# Patient Record
Sex: Female | Born: 1985 | Hispanic: Yes | Smoking: Never smoker
Health system: Southern US, Community
[De-identification: ages and names within clinical notes are randomized; demographics above are authoritative.]

## PROBLEM LIST (undated history)

## (undated) ENCOUNTER — Inpatient Hospital Stay (HOSPITAL_COMMUNITY): Payer: Self-pay

## (undated) DIAGNOSIS — N949 Unspecified condition associated with female genital organs and menstrual cycle: Secondary | ICD-10-CM

## (undated) DIAGNOSIS — N390 Urinary tract infection, site not specified: Secondary | ICD-10-CM

## (undated) DIAGNOSIS — Z789 Other specified health status: Secondary | ICD-10-CM

## (undated) DIAGNOSIS — R51 Headache: Secondary | ICD-10-CM

## (undated) DIAGNOSIS — D649 Anemia, unspecified: Secondary | ICD-10-CM

## (undated) DIAGNOSIS — F329 Major depressive disorder, single episode, unspecified: Secondary | ICD-10-CM

## (undated) DIAGNOSIS — F419 Anxiety disorder, unspecified: Secondary | ICD-10-CM

## (undated) HISTORY — DX: Anemia, unspecified: D64.9

## (undated) HISTORY — DX: Unspecified condition associated with female genital organs and menstrual cycle: N94.9

## (undated) HISTORY — PX: NO PAST SURGERIES: SHX2092

---
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
## 2009-06-22 ENCOUNTER — Inpatient Hospital Stay (HOSPITAL_COMMUNITY): Admission: AD | Admit: 2009-06-22 | Discharge: 2009-06-23 | Payer: Self-pay | Admitting: Obstetrics & Gynecology

## 2009-09-03 ENCOUNTER — Ambulatory Visit: Payer: Self-pay | Admitting: Advanced Practice Midwife

## 2009-09-03 ENCOUNTER — Inpatient Hospital Stay (HOSPITAL_COMMUNITY): Admission: AD | Admit: 2009-09-03 | Discharge: 2009-09-03 | Payer: Self-pay | Admitting: Obstetrics & Gynecology

## 2009-12-25 ENCOUNTER — Inpatient Hospital Stay (HOSPITAL_COMMUNITY): Admission: AD | Admit: 2009-12-25 | Discharge: 2009-12-26 | Payer: Self-pay | Admitting: Obstetrics & Gynecology

## 2009-12-25 ENCOUNTER — Ambulatory Visit: Payer: Self-pay | Admitting: Obstetrics and Gynecology

## 2009-12-29 ENCOUNTER — Inpatient Hospital Stay (HOSPITAL_COMMUNITY): Admission: AD | Admit: 2009-12-29 | Discharge: 2009-12-30 | Payer: Self-pay | Admitting: Obstetrics & Gynecology

## 2010-02-11 ENCOUNTER — Emergency Department (HOSPITAL_COMMUNITY): Admission: EM | Admit: 2010-02-11 | Discharge: 2010-02-11 | Payer: Self-pay | Admitting: Emergency Medicine

## 2011-01-19 ENCOUNTER — Inpatient Hospital Stay (HOSPITAL_COMMUNITY): Payer: Self-pay

## 2011-01-19 ENCOUNTER — Inpatient Hospital Stay (HOSPITAL_COMMUNITY)
Admit: 2011-01-19 | Discharge: 2011-01-19 | Disposition: A | Discharge status: Home / Self Care | Payer: Self-pay | Source: Ambulatory Visit | Attending: Obstetrics & Gynecology | Admitting: Obstetrics & Gynecology

## 2011-01-19 DIAGNOSIS — N1330 Unspecified hydronephrosis: Secondary | ICD-10-CM

## 2011-01-19 DIAGNOSIS — O2390 Unspecified genitourinary tract infection in pregnancy, unspecified trimester: Secondary | ICD-10-CM | POA: Insufficient documentation

## 2011-01-19 DIAGNOSIS — N390 Urinary tract infection, site not specified: Secondary | ICD-10-CM | POA: Insufficient documentation

## 2011-01-19 DIAGNOSIS — R109 Unspecified abdominal pain: Secondary | ICD-10-CM | POA: Insufficient documentation

## 2011-01-19 LAB — URINALYSIS, ROUTINE W REFLEX MICROSCOPIC
Bilirubin Urine: NEGATIVE
Ketones, ur: NEGATIVE mg/dL
Nitrite: NEGATIVE
Protein, ur: NEGATIVE mg/dL
Urobilinogen, UA: 1 mg/dL (ref 0.0–1.0)

## 2011-01-19 LAB — URINE MICROSCOPIC-ADD ON

## 2011-01-19 LAB — WET PREP, GENITAL
Trich, Wet Prep: NONE SEEN
Yeast Wet Prep HPF POC: NONE SEEN

## 2011-01-20 LAB — URINE CULTURE
Colony Count: NO GROWTH
Culture: NO GROWTH

## 2011-01-25 ENCOUNTER — Encounter: Payer: Self-pay | Admitting: Advanced Practice Midwife

## 2011-01-25 ENCOUNTER — Other Ambulatory Visit: Payer: Self-pay | Admitting: Obstetrics and Gynecology

## 2011-01-25 ENCOUNTER — Other Ambulatory Visit: Payer: Self-pay

## 2011-01-25 LAB — POCT URINALYSIS DIPSTICK
Bilirubin Urine: NEGATIVE
Glucose, UA: NEGATIVE mg/dL
Nitrite: NEGATIVE
Urobilinogen, UA: 0.2 mg/dL (ref 0.0–1.0)
pH: 7 (ref 5.0–8.0)

## 2011-01-25 LAB — CONVERTED CEMR LAB
Antibody Screen: NEGATIVE
Eosinophils Absolute: 0.1 10*3/uL (ref 0.0–0.7)
Eosinophils Relative: 1 % (ref 0–5)
HIV: NONREACTIVE
Hemoglobin: 10 g/dL — ABNORMAL LOW (ref 12.0–15.0)
Hepatitis B Surface Ag: NEGATIVE
Lymphocytes Relative: 21 % (ref 12–46)
Monocytes Absolute: 0.5 10*3/uL (ref 0.1–1.0)
Neutro Abs: 5.3 10*3/uL (ref 1.7–7.7)
Neutrophils Relative %: 71 % (ref 43–77)
RBC: 3.64 M/uL — ABNORMAL LOW (ref 3.87–5.11)
RDW: 14.2 % (ref 11.5–15.5)

## 2011-01-26 ENCOUNTER — Encounter: Payer: Self-pay | Admitting: Advanced Practice Midwife

## 2011-02-01 LAB — DIFFERENTIAL
Basophils Absolute: 0 10*3/uL (ref 0.0–0.1)
Basophils Absolute: 0 10*3/uL (ref 0.0–0.1)
Basophils Absolute: 0 10*3/uL (ref 0.0–0.1)
Basophils Relative: 0 % (ref 0–1)
Basophils Relative: 0 % (ref 0–1)
Basophils Relative: 0 % (ref 0–1)
Eosinophils Absolute: 0 10*3/uL (ref 0.0–0.7)
Eosinophils Absolute: 0.1 10*3/uL (ref 0.0–0.7)
Eosinophils Absolute: 0 10*3/uL (ref 0.0–0.7)
Eosinophils Relative: 0 % (ref 0–5)
Eosinophils Relative: 1 % (ref 0–5)
Eosinophils Relative: 0 % (ref 0–5)
Lymphocytes Relative: 14 % (ref 12–46)
Lymphocytes Relative: 8 % — ABNORMAL LOW (ref 12–46)
Lymphocytes Relative: 19 % (ref 12–46)
Lymphs Abs: 1 10*3/uL (ref 0.7–4.0)
Lymphs Abs: 1.6 10*3/uL (ref 0.7–4.0)
Lymphs Abs: 1.5 10*3/uL (ref 0.7–4.0)
Monocytes Absolute: 0.6 10*3/uL (ref 0.1–1.0)
Monocytes Absolute: 0.7 10*3/uL (ref 0.1–1.0)
Monocytes Absolute: 0.5 10*3/uL (ref 0.1–1.0)
Monocytes Relative: 5 % (ref 3–12)
Monocytes Relative: 6 % (ref 3–12)
Monocytes Relative: 6 % (ref 3–12)
Neutro Abs: 11.1 10*3/uL — ABNORMAL HIGH (ref 1.7–7.7)
Neutro Abs: 9.1 10*3/uL — ABNORMAL HIGH (ref 1.7–7.7)
Neutro Abs: 5.9 10*3/uL (ref 1.7–7.7)
Neutrophils Relative %: 80 % — ABNORMAL HIGH (ref 43–77)
Neutrophils Relative %: 87 % — ABNORMAL HIGH (ref 43–77)
Neutrophils Relative %: 74 % (ref 43–77)

## 2011-02-01 LAB — RAPID HIV SCREEN (WH-MAU): Rapid HIV Screen: NONREACTIVE

## 2011-02-01 LAB — COMPREHENSIVE METABOLIC PANEL
ALT: 50 U/L — ABNORMAL HIGH (ref 0–35)
AST: 35 U/L (ref 0–37)
Albumin: 4.3 g/dL (ref 3.5–5.2)
Alkaline Phosphatase: 81 U/L (ref 39–117)
BUN: 11 mg/dL (ref 6–23)
CO2: 27 mEq/L (ref 19–32)
Calcium: 9.3 mg/dL (ref 8.4–10.5)
Chloride: 109 mEq/L (ref 96–112)
Creatinine, Ser: 0.49 mg/dL (ref 0.4–1.2)
GFR calc Af Amer: >60 mL/min (ref >60)
GFR calc non Af Amer: >60 mL/min (ref >60)
Glucose, Bld: 104 mg/dL — ABNORMAL HIGH (ref 70–99)
Potassium: 4.3 mEq/L (ref 3.5–5.1)
Sodium: 139 mEq/L (ref 135–145)
Total Bilirubin: 0.9 mg/dL (ref 0.3–1.2)
Total Protein: 7.7 g/dL (ref 6.0–8.3)

## 2011-02-01 LAB — CBC
HCT: 22.9 % — ABNORMAL LOW (ref 36.0–46.0)
HCT: 32.5 % — ABNORMAL LOW (ref 36.0–46.0)
HCT: 31.2 % — ABNORMAL LOW (ref 36.0–46.0)
Hemoglobin: 10.8 g/dL — ABNORMAL LOW (ref 12.0–15.0)
Hemoglobin: 7.5 g/dL — ABNORMAL LOW (ref 12.0–15.0)
Hemoglobin: 10.1 g/dL — ABNORMAL LOW (ref 12.0–15.0)
MCHC: 32.9 g/dL (ref 30.0–36.0)
MCHC: 33.2 g/dL (ref 30.0–36.0)
MCHC: 32.4 g/dL (ref 30.0–36.0)
MCV: 83.2 fL (ref 78.0–100.0)
MCV: 83.6 fL (ref 78.0–100.0)
MCV: 78.8 fL (ref 78.0–100.0)
Platelets: 235 10*3/uL (ref 150–400)
Platelets: 241 10*3/uL (ref 150–400)
Platelets: 253 10*3/uL (ref 150–400)
RBC: 2.74 MIL/uL — ABNORMAL LOW (ref 3.87–5.11)
RBC: 3.9 MIL/uL (ref 3.87–5.11)
RBC: 3.96 MIL/uL (ref 3.87–5.11)
RDW: 14.8 % (ref 11.5–15.5)
RDW: 15.3 % (ref 11.5–15.5)
RDW: 16 % — ABNORMAL HIGH (ref 11.5–15.5)
WBC: 11.4 10*3/uL — ABNORMAL HIGH (ref 4.0–10.5)
WBC: 12.9 10*3/uL — ABNORMAL HIGH (ref 4.0–10.5)
WBC: 8 10*3/uL (ref 4.0–10.5)

## 2011-02-01 LAB — URINALYSIS, ROUTINE W REFLEX MICROSCOPIC
Bilirubin Urine: NEGATIVE
Glucose, UA: NEGATIVE mg/dL
Ketones, ur: NEGATIVE mg/dL
Nitrite: NEGATIVE
Protein, ur: NEGATIVE mg/dL
Specific Gravity, Urine: 1.015 (ref 1.005–1.030)
Urobilinogen, UA: 0.2 mg/dL (ref 0.0–1.0)
pH: 7 (ref 5.0–8.0)

## 2011-02-01 LAB — CREATININE, SERUM
Creatinine, Ser: 0.31 mg/dL — ABNORMAL LOW (ref 0.4–1.2)
GFR calc Af Amer: >60 mL/min (ref >60)
GFR calc non Af Amer: >60 mL/min (ref >60)

## 2011-02-01 LAB — CULTURE, BLOOD (ROUTINE X 2)
Culture: NO GROWTH
Culture: NO GROWTH

## 2011-02-01 LAB — POCT CARDIAC MARKERS
CKMB, poc: 1.3 ng/mL (ref 1.0–8.0)
Myoglobin, poc: 25.3 ng/mL (ref 12–200)

## 2011-02-01 LAB — URINE MICROSCOPIC-ADD ON

## 2011-02-01 LAB — HEPATITIS B SURFACE ANTIGEN: Hepatitis B Surface Ag: NEGATIVE

## 2011-02-01 LAB — ABO/RH: ABO/RH(D): O POS

## 2011-02-01 LAB — RPR: RPR Ser Ql: NONREACTIVE

## 2011-02-01 LAB — TYPE AND SCREEN
ABO/RH(D): O POS
Antibody Screen: NEGATIVE

## 2011-02-01 LAB — D-DIMER, QUANTITATIVE: D-Dimer, Quant: 0.51 ug/mL-FEU — ABNORMAL HIGH (ref 0.00–0.48)

## 2011-02-01 LAB — RUBELLA SCREEN: Rubella: 499.2 IU/mL — ABNORMAL HIGH

## 2011-02-15 ENCOUNTER — Other Ambulatory Visit: Payer: Self-pay | Admitting: Obstetrics and Gynecology

## 2011-02-15 LAB — POCT URINALYSIS DIP (DEVICE)
Bilirubin Urine: NEGATIVE
Ketones, ur: NEGATIVE mg/dL
Nitrite: NEGATIVE
Protein, ur: NEGATIVE mg/dL

## 2011-02-16 ENCOUNTER — Ambulatory Visit (HOSPITAL_COMMUNITY)
Admit: 2011-02-16 | Discharge: 2011-02-16 | Disposition: A | Discharge status: Home / Self Care | Payer: Self-pay | Attending: Obstetrics & Gynecology | Admitting: Obstetrics & Gynecology

## 2011-02-18 LAB — CBC
MCHC: 35 g/dL (ref 30.0–36.0)
RBC: 3.51 MIL/uL — ABNORMAL LOW (ref 3.87–5.11)
WBC: 7.9 10*3/uL (ref 4.0–10.5)

## 2011-02-18 LAB — URINALYSIS, ROUTINE W REFLEX MICROSCOPIC
Bilirubin Urine: NEGATIVE
Nitrite: NEGATIVE
Specific Gravity, Urine: 1.015 (ref 1.005–1.030)
Urobilinogen, UA: 1 mg/dL (ref 0.0–1.0)

## 2011-02-18 LAB — GC/CHLAMYDIA PROBE AMP, GENITAL
Chlamydia, DNA Probe: NEGATIVE
GC Probe Amp, Genital: NEGATIVE

## 2011-02-22 ENCOUNTER — Inpatient Hospital Stay (HOSPITAL_COMMUNITY)
Admission: AD | Admit: 2011-02-22 | Discharge: 2011-02-22 | Disposition: A | Discharge status: Home / Self Care | Payer: Self-pay | Source: Ambulatory Visit | Attending: Obstetrics and Gynecology | Admitting: Obstetrics and Gynecology

## 2011-02-22 DIAGNOSIS — R109 Unspecified abdominal pain: Secondary | ICD-10-CM | POA: Insufficient documentation

## 2011-02-22 DIAGNOSIS — O4700 False labor before 37 completed weeks of gestation, unspecified trimester: Secondary | ICD-10-CM | POA: Insufficient documentation

## 2011-02-22 LAB — URINE MICROSCOPIC-ADD ON

## 2011-02-22 LAB — WET PREP, GENITAL: Trich, Wet Prep: NONE SEEN

## 2011-02-22 LAB — URINALYSIS, ROUTINE W REFLEX MICROSCOPIC
Bilirubin Urine: NEGATIVE
Hgb urine dipstick: NEGATIVE
Ketones, ur: NEGATIVE mg/dL
Specific Gravity, Urine: >1.03 — ABNORMAL HIGH (ref 1.005–1.030)
Urobilinogen, UA: 1 mg/dL (ref 0.0–1.0)

## 2011-02-22 LAB — GC/CHLAMYDIA PROBE AMP, URINE
Chlamydia, Swab/Urine, PCR: NEGATIVE
GC Probe Amp, Urine: NEGATIVE

## 2011-02-23 LAB — URINE CULTURE: Culture  Setup Time: 201204111017

## 2011-03-01 ENCOUNTER — Other Ambulatory Visit: Payer: Self-pay | Admitting: Obstetrics and Gynecology

## 2011-03-01 LAB — POCT URINALYSIS DIP (DEVICE)
Nitrite: NEGATIVE
Protein, ur: NEGATIVE mg/dL
Urobilinogen, UA: 0.2 mg/dL (ref 0.0–1.0)
pH: 6 (ref 5.0–8.0)

## 2011-03-08 ENCOUNTER — Other Ambulatory Visit: Payer: Self-pay | Admitting: Obstetrics and Gynecology

## 2011-03-08 LAB — POCT URINALYSIS DIP (DEVICE)
Ketones, ur: NEGATIVE mg/dL
Nitrite: NEGATIVE
Protein, ur: NEGATIVE mg/dL
Urobilinogen, UA: 0.2 mg/dL (ref 0.0–1.0)

## 2011-03-15 ENCOUNTER — Other Ambulatory Visit: Payer: Self-pay | Admitting: Obstetrics and Gynecology

## 2011-03-15 LAB — POCT URINALYSIS DIP (DEVICE)
Bilirubin Urine: NEGATIVE
Ketones, ur: NEGATIVE mg/dL
Protein, ur: NEGATIVE mg/dL

## 2011-03-22 ENCOUNTER — Other Ambulatory Visit: Payer: Self-pay | Admitting: Obstetrics and Gynecology

## 2011-03-22 LAB — POCT URINALYSIS DIP (DEVICE)
Bilirubin Urine: NEGATIVE
Glucose, UA: NEGATIVE mg/dL
Ketones, ur: NEGATIVE mg/dL
Nitrite: NEGATIVE
Protein, ur: NEGATIVE mg/dL
pH: 6 (ref 5.0–8.0)

## 2011-03-29 ENCOUNTER — Other Ambulatory Visit: Payer: Self-pay | Admitting: Family Medicine

## 2011-03-29 LAB — POCT URINALYSIS DIP (DEVICE)
Bilirubin Urine: NEGATIVE
Glucose, UA: NEGATIVE mg/dL
Protein, ur: NEGATIVE mg/dL
Urobilinogen, UA: 0.2 mg/dL (ref 0.0–1.0)

## 2011-03-31 ENCOUNTER — Inpatient Hospital Stay (HOSPITAL_COMMUNITY)
Admission: AD | Admit: 2011-03-31 | Discharge: 2011-04-02 | Disposition: A | Discharge status: Home / Self Care | Payer: Medicaid Other | Source: Ambulatory Visit | Attending: Family Medicine | Admitting: Family Medicine

## 2011-03-31 ENCOUNTER — Other Ambulatory Visit: Payer: Self-pay

## 2011-03-31 LAB — CBC
Hemoglobin: 10.7 g/dL — ABNORMAL LOW (ref 12.0–15.0)
MCHC: 32.2 g/dL (ref 30.0–36.0)
RDW: 17.3 % — ABNORMAL HIGH (ref 11.5–15.5)

## 2011-04-01 LAB — RPR: RPR Ser Ql: NONREACTIVE

## 2011-04-01 LAB — ABO/RH: ABO/RH(D): O POS

## 2011-05-01 ENCOUNTER — Ambulatory Visit: Payer: Self-pay | Admitting: Obstetrics & Gynecology

## 2011-05-03 ENCOUNTER — Ambulatory Visit (INDEPENDENT_AMBULATORY_CARE_PROVIDER_SITE_OTHER): Payer: Self-pay | Admitting: Obstetrics and Gynecology

## 2011-06-09 ENCOUNTER — Inpatient Hospital Stay (HOSPITAL_COMMUNITY)
Admit: 2011-06-09 | Discharge: 2011-06-09 | Disposition: A | Discharge status: Home / Self Care | Payer: Self-pay | Source: Ambulatory Visit | Attending: Obstetrics & Gynecology | Admitting: Obstetrics & Gynecology

## 2011-06-09 ENCOUNTER — Inpatient Hospital Stay (HOSPITAL_COMMUNITY): Payer: Self-pay

## 2011-06-09 DIAGNOSIS — M549 Dorsalgia, unspecified: Secondary | ICD-10-CM | POA: Insufficient documentation

## 2011-06-09 DIAGNOSIS — R102 Pelvic and perineal pain: Secondary | ICD-10-CM

## 2011-06-09 DIAGNOSIS — N83209 Unspecified ovarian cyst, unspecified side: Secondary | ICD-10-CM

## 2011-06-09 DIAGNOSIS — N949 Unspecified condition associated with female genital organs and menstrual cycle: Secondary | ICD-10-CM | POA: Insufficient documentation

## 2011-06-09 DIAGNOSIS — O99893 Other specified diseases and conditions complicating puerperium: Secondary | ICD-10-CM | POA: Insufficient documentation

## 2011-06-09 DIAGNOSIS — R109 Unspecified abdominal pain: Secondary | ICD-10-CM | POA: Insufficient documentation

## 2011-06-09 HISTORY — DX: Other specified health status: Z78.9

## 2011-06-09 HISTORY — DX: Major depressive disorder, single episode, unspecified: F32.9

## 2011-06-09 LAB — CBC
HCT: 32 % — ABNORMAL LOW (ref 36.0–46.0)
Hemoglobin: 10.9 g/dL — ABNORMAL LOW (ref 12.0–15.0)
MCH: 28.8 pg (ref 26.0–34.0)
MCHC: 34.1 g/dL (ref 30.0–36.0)
RBC: 3.78 MIL/uL — ABNORMAL LOW (ref 3.87–5.11)

## 2011-06-09 LAB — URINALYSIS, ROUTINE W REFLEX MICROSCOPIC
Bilirubin Urine: NEGATIVE
Nitrite: NEGATIVE
Protein, ur: NEGATIVE mg/dL
pH: 6 (ref 5.0–8.0)

## 2011-06-09 LAB — URINE MICROSCOPIC-ADD ON

## 2011-06-09 LAB — WET PREP, GENITAL

## 2011-06-09 MED ORDER — OXYCODONE-ACETAMINOPHEN 5-325 MG PO TABS
1.0000 | ORAL_TABLET | Freq: Once | ORAL | Status: DC
  Administered 2011-06-09: 1 via ORAL
  Filled 2011-06-09: qty 1

## 2011-06-09 MED ORDER — OXYCODONE-ACETAMINOPHEN 5-325 MG PO TABS: 1.0000 | ORAL_TABLET | Freq: Four times a day (QID) | ORAL | Status: DC | PRN

## 2011-06-09 NOTE — Progress Notes (Signed)
Pt stats. " I have had pain in my low bad and low back since July 1. I had a baby 5/18 and when I when in for my 6 wks check up at the clinic I told them then that I was having pain but they didn't do a vaginal exam , they only checked my temperature. Since then the pain has has gotten worse and it is now going into my back. I also have some discharge too."

## 2011-06-09 NOTE — ED Provider Notes (Signed)
History     Chief Complaint  Patient presents with  . Abdominal Pain   HPI  Debra Allen  is a 25 y.o. G4P2012 at 8 wks s/p NSVD presenting with pelvic and back pain x > 2 weeks. Pain is mostly right sided and is worsening. No vaginal bleeding, lochia stopped at 2 weeks PP. + discharge. Denies dysuria, n/v, constipation or diarrhea. Had prenatal care at [facility] with 6 wk PP visit 2 weeks ago.     OB History    Grav Para Term Preterm Abortions TAB SAB Ect Mult Living   3 2 2  0 1 0 1 0 0 2      Past Medical History  Diagnosis Date  . No pertinent past medical history   . Depression     post partum G1 & has appt to be seen for post partum depression now    Past Surgical History  Procedure Date  . No past surgeries     History reviewed. No pertinent family history.  History  Substance Use Topics  . Smoking status: Never Smoker   . Smokeless tobacco: Never Used  . Alcohol Use: No    Allergies: No Known Allergies  Prescriptions prior to admission  Medication Sig Dispense Refill  . acetaminophen (TYLENOL) 325 MG tablet Take 650 mg by mouth every 6 (six) hours as needed. As needed for pain         Review of Systems  Constitutional: Negative.   Respiratory: Negative.   Cardiovascular: Negative.   Gastrointestinal: Positive for abdominal pain. Negative for nausea, vomiting, diarrhea and constipation.  Genitourinary: Negative for dysuria, urgency, frequency, hematuria and flank pain.       Negative for vaginal bleeding, + vaginal discharge and pelvic pain  Musculoskeletal: Positive for back pain.  Neurological: Negative.   Psychiatric/Behavioral: Negative.    Physical Exam   Blood pressure 107/71, pulse 76, temperature 98.2 F (36.8 C), temperature source Oral, resp. rate 18, height 5\' 1"  (1.549 m), weight 74.447 kg (164 lb 2 oz), not currently breastfeeding.  Physical Exam  Constitutional: She is oriented to person, place, and time. She appears well-developed and  well-nourished. No distress.  HENT:  Head: Normocephalic and atraumatic.  Cardiovascular: Normal rate, regular rhythm and normal heart sounds.   Respiratory: Effort normal and breath sounds normal. No respiratory distress.  GI: Soft. Bowel sounds are normal. She exhibits no distension and no mass. There is no tenderness. There is no rebound and no guarding.  Genitourinary: There is no rash or lesion on the right labia. There is no rash or lesion on the left labia. Uterus is tender. Uterus is not deviated, not enlarged and not fixed. Cervix exhibits no motion tenderness, no discharge and no friability. Right adnexum displays tenderness and fullness. Right adnexum displays no mass. Left adnexum displays no mass, no tenderness and no fullness. No erythema, tenderness or bleeding around the vagina. Vaginal discharge (thick yellowish white, nonmalodorous) found.  Neurological: She is alert and oriented to person, place, and time.  Skin: Skin is warm and dry.  Psychiatric: She has a normal mood and affect.    MAU Course  Procedures  Results for orders placed during the hospital encounter of 06/09/11 (from the past 24 hour(s))  URINALYSIS, ROUTINE W REFLEX MICROSCOPIC     Status: Abnormal   Collection Time   06/09/11  3:50 PM      Component Value Range   Color, Urine YELLOW  YELLOW    Appearance HAZY (*)  CLEAR    Specific Gravity, Urine >1.030 (*) 1.005 - 1.030    pH 6.0  5.0 - 8.0    Glucose, UA NEGATIVE  NEGATIVE (mg/dL)   Hgb urine dipstick NEGATIVE  NEGATIVE    Bilirubin Urine NEGATIVE  NEGATIVE    Ketones, ur NEGATIVE  NEGATIVE (mg/dL)   Protein, ur NEGATIVE  NEGATIVE (mg/dL)   Urobilinogen, UA 0.2  0.0 - 1.0 (mg/dL)   Nitrite NEGATIVE  NEGATIVE    Leukocytes, UA TRACE (*) NEGATIVE   URINE MICROSCOPIC-ADD ON     Status: Abnormal   Collection Time   06/09/11  3:50 PM      Component Value Range   Squamous Epithelial / LPF FEW (*) RARE    WBC, UA 3-6  <3 (WBC/hpf)   Bacteria, UA FEW  (*) RARE    Urine-Other MUCOUS PRESENT    POCT PREGNANCY, URINE     Status: Normal   Collection Time   06/09/11  4:05 PM      Component Value Range   Preg Test, Ur NEGATIVE    CBC     Status: Abnormal   Collection Time   06/09/11  4:43 PM      Component Value Range   WBC 6.7  4.0 - 10.5 (K/uL)   RBC 3.78 (*) 3.87 - 5.11 (MIL/uL)   Hemoglobin 10.9 (*) 12.0 - 15.0 (g/dL)   HCT 96.0 (*) 45.4 - 46.0 (%)   MCV 84.7  78.0 - 100.0 (fL)   MCH 28.8  26.0 - 34.0 (pg)   MCHC 34.1  30.0 - 36.0 (g/dL)   RDW 09.8  11.9 - 14.7 (%)   Platelets 176  150 - 400 (K/uL)  WET PREP, GENITAL     Status: Abnormal   Collection Time   06/09/11  4:44 PM      Component Value Range   Yeast, Wet Prep NONE SEEN  NONE SEEN    Trich, Wet Prep NONE SEEN  NONE SEEN    Clue Cells, Wet Prep NONE SEEN  NONE SEEN    WBC, Wet Prep HPF POC MODERATE (*) NONE SEEN     US Transvaginal Non-ob  06/09/2011  *RADIOLOGY REPORT*  Clinical Data: Pelvic pain 8 weeks post-partum. G3 P2 AB1.  LMP over 1 year ago.  TRANSABDOMINAL AND TRANSVAGINAL ULTRASOUND OF PELVIS 06/09/2011:  Technique:  Both transabdominal and transvaginal ultrasound examinations of the pelvis were performed. Transabdominal technique was performed for global imaging of the pelvis including uterus, ovaries, adnexal regions, and pelvic cul-de-sac.  Comparison: No prior non-obstetric pelvic ultrasound.  CT pelvis 06/29/2007.   It was necessary to proceed with endovaginal exam following the transabdomnial exam to visualize the ovaries and adnexae which were suboptimally imaged transabdominally.  Findings:  Uterus: Mildly enlarged measuring approximately 10.10 x 5.5 x 6.0 cm, consistent with the recent postpartum state.  Prominent transitional zone between the endometrium and myometrium.  Endometrium: Normal trilaminar appearance measuring approximately 11 mm in thickness.  No endometrial fluid or mass.  Right ovary:  Normal in size measuring approximately 4.2 x 1.9 x 2.3 cm,  containing several small follicular cysts.  Fluid within the fimbriated end of the right fallopian tube, adjacent to the ovary, versus a complex cyst in the right ovary.  No solid mass. Normal color Doppler signal within the ovary.  Left ovary: Normal in size measuring approximately 4.3 x 2.3 x 1.7 cm, containing several small follicular cysts.  No dominant cyst or solid mass.  Normal  color Doppler signal within the ovary.  Other findings: No other adnexal masses or free pelvic fluid.  IMPRESSION:  1.  Fluid within the fimbriated end of the right fallopian tube, adjacent to the right ovary, versus a complex cyst in the ovary. 2.  Prominent transitional zone between the endometrium and myometrium, query adenomyosis. 3.  Normal-appearing left ovary. 4.  Normal post-partum uterus.  Original Report Authenticated By: Arnell Sieving, M.D.        Assessment and Plan  24 y.o. [id number] at 8 weeks PP with probable right ovarian cyst Pt to f/u in gyn clinic in 4-6 weeks Return to MAU with worsening sx  Odes Lolli 06/09/2011, 5:51 PM

## 2011-06-10 LAB — GC/CHLAMYDIA PROBE AMP, GENITAL
Chlamydia, DNA Probe: NEGATIVE
GC Probe Amp, Genital: NEGATIVE

## 2011-07-26 ENCOUNTER — Encounter: Payer: Medicaid Other | Admitting: Obstetrics & Gynecology

## 2011-08-25 ENCOUNTER — Inpatient Hospital Stay (EMERGENCY_DEPARTMENT_HOSPITAL)
Admission: AD | Admit: 2011-08-25 | Discharge: 2011-08-25 | Disposition: A | Discharge status: Home / Self Care | Payer: Self-pay | Source: Ambulatory Visit | Attending: Obstetrics & Gynecology | Admitting: Obstetrics & Gynecology

## 2011-08-25 DIAGNOSIS — M940 Chondrocostal junction syndrome [Tietze]: Secondary | ICD-10-CM

## 2011-08-25 LAB — WET PREP, GENITAL: Yeast Wet Prep HPF POC: NONE SEEN

## 2011-08-25 LAB — URINALYSIS, ROUTINE W REFLEX MICROSCOPIC
Bilirubin Urine: NEGATIVE
Hgb urine dipstick: NEGATIVE
Ketones, ur: 15 mg/dL — AB
Specific Gravity, Urine: >1.03 — ABNORMAL HIGH (ref 1.005–1.030)
Urobilinogen, UA: 1 mg/dL (ref 0.0–1.0)

## 2011-08-25 MED ORDER — IBUPROFEN 800 MG PO TABS: 800.0000 mg | ORAL_TABLET | Freq: Three times a day (TID) | ORAL | Status: DC

## 2011-08-25 NOTE — Progress Notes (Signed)
Breast exam preformed by Lilyan Punt NP no abnormal lumps in breasts noted, pt noted to have some muscle soreness

## 2011-08-25 NOTE — ED Provider Notes (Signed)
History     Chief Complaint  Patient presents with  . Breast Pain  . Abdominal Pain   HPI Debra Allen 25 y.o. comes to MAU tonight with breast pain and lower abd pain and back pain which she attributes to ovarian cyst.  Was seen in MAU in July.  Took pain medication for 2 weeks but continues to have pain now.  Is also concerned about her vaginal discharge.  Currenly using condoms for contraception. Has an appointment in GYN clinic on 09-06-11 to follow up the ultrasound done in July.   OB History    Grav Para Term Preterm Abortions TAB SAB Ect Mult Living   3 2 2  0 1 0 1 0 0 2      Past Medical History  Diagnosis Date  . No pertinent past medical history   . Depression     post partum G1 & has appt to be seen for post partum depression now    Past Surgical History  Procedure Date  . No past surgeries     No family history on file.  History  Substance Use Topics  . Smoking status: Never Smoker   . Smokeless tobacco: Never Used  . Alcohol Use: No    Allergies: No Known Allergies  Prescriptions prior to admission  Medication Sig Dispense Refill  . ibuprofen (ADVIL,MOTRIN) 200 MG tablet Take 400 mg by mouth every 6 (six) hours as needed. For pain       . oxyCODONE-acetaminophen (ROXICET) 5-325 MG per tablet Take 1 tablet by mouth every 6 (six) hours as needed for pain.  20 tablet  0    Review of Systems  Gastrointestinal: Positive for abdominal pain.  Genitourinary:       Breast pain and pain under her arms  Musculoskeletal: Positive for back pain.   Physical Exam   Blood pressure 108/69, pulse 79, temperature 98.2 F (36.8 C), temperature source Oral, resp. rate 20, height 5\' 2"  (1.575 m), weight 166 lb 6.4 oz (75.479 kg), last menstrual period 08/09/2011, SpO2 97.00%.  Physical Exam  Nursing note and vitals reviewed. Constitutional: She is oriented to person, place, and time. She appears well-developed and well-nourished.  HENT:  Head: Normocephalic.    Eyes: EOM are normal.  Neck: Neck supple.  Respiratory:       Breast exam normal with no masses Tenderness noted in intercostal spaces between ribs, not in breast tissue  GI: Soft. There is no tenderness. There is no rebound and no guarding.  Genitourinary:       Speculum exam: Vulva normal  Vagina - Small amount of clear, mucoid  discharge, no odor Cervix - No contact bleeding Bimanual exam: Cervix closed Uterus non tender, normal size Adnexa non tender, no masses bilaterally GC/Chlam, wet prep done Chaperone present for exam.    Musculoskeletal: Normal range of motion.  Neurological: She is alert and oriented to person, place, and time.  Skin: Skin is warm and dry.  Psychiatric: She has a normal mood and affect.    MAU Course  Procedures Results for orders placed during the hospital encounter of 08/25/11 (from the past 24 hour(s))  URINALYSIS, ROUTINE W REFLEX MICROSCOPIC     Status: Abnormal   Collection Time   08/25/11  8:15 PM      Component Value Range   Color, Urine ORANGE (*) YELLOW    Appearance CLEAR  CLEAR    Specific Gravity, Urine >1.030 (*) 1.005 - 1.030    pH  6.0  5.0 - 8.0    Glucose, UA NEGATIVE  NEGATIVE (mg/dL)   Hgb urine dipstick NEGATIVE  NEGATIVE    Bilirubin Urine NEGATIVE  NEGATIVE    Ketones, ur 15 (*) NEGATIVE (mg/dL)   Protein, ur NEGATIVE  NEGATIVE (mg/dL)   Urobilinogen, UA 1.0  0.0 - 1.0 (mg/dL)   Nitrite NEGATIVE  NEGATIVE    Leukocytes, UA NEGATIVE  NEGATIVE   POCT PREGNANCY, URINE     Status: Normal   Collection Time   08/25/11 10:49 PM      Component Value Range   Preg Test, Ur NEGATIVE    WET PREP, GENITAL     Status: Abnormal   Collection Time   08/25/11 10:59 PM      Component Value Range   Yeast, Wet Prep NONE SEEN  NONE SEEN    Trich, Wet Prep NONE SEEN  NONE SEEN    Clue Cells, Wet Prep FEW (*) NONE SEEN    WBC, Wet Prep HPF POC FEW (*) NONE SEEN     MDM No abdominal pain at present, so ultrasound not  repeated Client reassured re: breast pain and explained the difference between pain in her breast and pain in the chest wall.  Ultrasound done in June 09, 2011 *RADIOLOGY REPORT*  Clinical Data: Pelvic pain 8 weeks post-partum. G3 P2 AB1. LMP  over 1 year ago.  TRANSABDOMINAL AND TRANSVAGINAL ULTRASOUND OF PELVIS 06/09/2011:  Technique: Both transabdominal and transvaginal ultrasound  examinations of the pelvis were performed. Transabdominal technique  was performed for global imaging of the pelvis including uterus,  ovaries, adnexal regions, and pelvic cul-de-sac.  Comparison: No prior non-obstetric pelvic ultrasound. CT pelvis  06/29/2007.  It was necessary to proceed with endovaginal exam following the  transabdomnial exam to visualize the ovaries and adnexae which were  suboptimally imaged transabdominally.  Findings:  Uterus: Mildly enlarged measuring approximately 10.10 x 5.5 x 6.0  cm, consistent with the recent postpartum state. Prominent  transitional zone between the endometrium and myometrium.  Endometrium: Normal trilaminar appearance measuring approximately  11 mm in thickness. No endometrial fluid or mass.  Right ovary: Normal in size measuring approximately 4.2 x 1.9 x  2.3 cm, containing several small follicular cysts. Fluid within  the fimbriated end of the right fallopian tube, adjacent to the  ovary, versus a complex cyst in the right ovary. No solid mass.  Normal color Doppler signal within the ovary.  Left ovary: Normal in size measuring approximately 4.3 x 2.3 x 1.7  cm, containing several small follicular cysts. No dominant cyst or  solid mass. Normal color Doppler signal within the ovary.  Other findings: No other adnexal masses or free pelvic fluid.  IMPRESSION:  1. Fluid within the fimbriated end of the right fallopian tube,  adjacent to the right ovary, versus a complex cyst in the ovary.  2. Prominent transitional zone between the endometrium and   myometrium, query adenomyosis.  3. Normal-appearing left ovary.  4. Normal post-partum uterus.    Assessment and Plan  Costochondritis  Plan Keep appointment in GYN clinic for further evaluation of previously identified ovarian cyst Will prescribe Ibuprofen PO to help with pain No other infection identified tonight.  Shakti Fleer 08/25/2011, 11:21 PM   Nolene Bernheim, NP 08/25/11 2330

## 2011-08-25 NOTE — Progress Notes (Signed)
Pt states she has pain on her breasts more on the right side, the pain goes to her back as well from her breasts. pt states she was also dx with cyst on the left ovary about 1 month ago.Pt states she is having pain every day and her stomach is getting bigger. Pt states she is having a headache and a lot of discharge.

## 2011-08-25 NOTE — Progress Notes (Signed)
3wks ago started having pain in breasts. Both breasts but more on R. Pain is getting worse, esp when reaches upward. R breast with some redness. Also told had ovarian cyst on L. Was told would be called with appt but has not heard anything. Some chills at times but no fever. Has 17mo old and breast fed him for 3 wks.

## 2011-09-07 ENCOUNTER — Ambulatory Visit (INDEPENDENT_AMBULATORY_CARE_PROVIDER_SITE_OTHER): Payer: Self-pay | Admitting: Obstetrics & Gynecology

## 2011-09-07 DIAGNOSIS — N949 Unspecified condition associated with female genital organs and menstrual cycle: Secondary | ICD-10-CM

## 2011-09-07 DIAGNOSIS — Z0000 Encounter for general adult medical examination without abnormal findings: Secondary | ICD-10-CM

## 2011-09-07 DIAGNOSIS — R102 Pelvic and perineal pain: Secondary | ICD-10-CM

## 2011-09-07 DIAGNOSIS — N83209 Unspecified ovarian cyst, unspecified side: Secondary | ICD-10-CM

## 2011-09-07 DIAGNOSIS — Z23 Encounter for immunization: Secondary | ICD-10-CM

## 2011-09-07 DIAGNOSIS — R109 Unspecified abdominal pain: Secondary | ICD-10-CM

## 2011-09-07 LAB — POCT URINALYSIS DIP (DEVICE)
Bilirubin Urine: NEGATIVE
Glucose, UA: NEGATIVE mg/dL
Ketones, ur: NEGATIVE mg/dL
Leukocytes, UA: NEGATIVE
Protein, ur: NEGATIVE mg/dL
pH: 5 (ref 5.0–8.0)

## 2011-09-07 MED ORDER — NORGESTIMATE-ETH ESTRADIOL 0.25-35 MG-MCG PO TABS: 1.0000 | ORAL_TABLET | Freq: Every day | ORAL | Status: DC

## 2011-09-07 MED ORDER — INFLUENZA VIRUS VACC SPLIT PF IM SUSP
0.5000 mL | Freq: Once | INTRAMUSCULAR | Status: DC
  Administered 2011-09-07: 0.5 mL via INTRAMUSCULAR

## 2011-09-07 MED ORDER — IBUPROFEN 600 MG PO TABS: 600.0000 mg | ORAL_TABLET | Freq: Four times a day (QID) | ORAL | Status: DC | PRN

## 2011-09-07 NOTE — Patient Instructions (Signed)
Ovarian Cyst The ovaries are small organs that are on each side of the uterus. The ovaries are the organs that produce the female hormones, estrogen and progesterone. An ovarian cyst is a sac filled with fluid that can vary in its size. It is normal for a small cyst to form in women who are in the childbearing age and who have menstrual periods. This type of cyst is called a follicle cyst that becomes an ovulation cyst (corpus luteum cyst) after it produces the women's egg. It later goes away on its own if the woman does not become pregnant. There are other kinds of ovarian cysts that may cause problems and may need to be treated. The most serious problem is a cyst with cancer. It should be noted that menopausal women who have an ovarian cyst are at a higher risk of it being a cancer cyst. They should be evaluated very quickly, thoroughly and followed closely. This is especially true in menopausal women because of the high rate of ovarian cancer in women in menopause. CAUSES AND TYPES OF OVARIAN CYSTS:  FUNCTIONAL CYST: The follicle/corpus luteum cyst is a functional cyst that occurs every month during ovulation with the menstrual cycle. They go away with the next menstrual cycle if the woman does not get pregnant. Usually, there are no symptoms with a functional cyst.   ENDOMETRIOMA CYST: This cyst develops from the lining of the uterus tissue. This cyst gets in or on the ovary. It grows every month from the bleeding during the menstrual period. It is also called a "chocolate cyst" because it becomes filled with blood that turns brown. This cyst can cause pain in the lower abdomen during intercourse and with your menstrual period.   CYSTADENOMA CYST: This cyst develops from the cells on the outside of the ovary. They usually are not cancerous. They can get very big and cause lower abdomen pain and pain with intercourse. This type of cyst can twist on itself, cut off its blood supply and cause severe pain.  It also can easily rupture and cause a lot of pain.   DERMOID CYST: This type of cyst is sometimes found in both ovaries. They are found to have different kinds of body tissue in the cyst. The tissue includes skin, teeth, hair, and/or cartilage. They usually do not have symptoms unless they get very big. Dermoid cysts are rarely cancerous.   POLYCYSTIC OVARY: This is a rare condition with hormone problems that produces many small cysts on both ovaries. The cysts are follicle-like cysts that never produce an egg and become a corpus luteum. It can cause an increase in body weight, infertility, acne, increase in body and facial hair and lack of menstrual periods or rare menstrual periods. Many women with this problem develop type 2 diabetes. The exact cause of this problem is unknown. A polycystic ovary is rarely cancerous.   THECA LUTEIN CYST: Occurs when too much hormone (human chorionic gonadotropin) is produced and over-stimulates the ovaries to produce an egg. They are frequently seen when doctors stimulate the ovaries for invitro-fertilization (test tube babies).   LUTEOMA CYST: This cyst is seen during pregnancy. Rarely it can cause an obstruction to the birth canal during labor and delivery. They usually go away after delivery.  SYMPTOMS   Pelvic pain or pressure.   Pain during sexual intercourse.   Increasing girth (swelling) of the abdomen.   Abnormal menstrual periods.   Increasing pain with menstrual periods.   You stop having   menstrual periods and you are not pregnant.  DIAGNOSIS  The diagnosis can be made during:  Routine or annual pelvic examination (common).   Ultrasound.   X-ray of the pelvis.   CT Scan.   MRI.   Blood tests.  TREATMENT   Treatment may only be to follow the cyst monthly for 2 to 3 months with your caregiver. Many go away on their own, especially functional cysts.   May be aspirated (drained) with a long needle with ultrasound, or by laparoscopy  (inserting a tube into the pelvis through a small incision).   The whole cyst can be removed by laparoscopy.   Sometimes the cyst may need to be removed through an incision in the lower abdomen.   Hormone treatment is sometimes used to help dissolve certain cysts.   Birth control pills are sometimes used to help dissolve certain cysts.  HOME CARE INSTRUCTIONS  Follow your caregiver's advice regarding:  Medicine.   Follow up visits to evaluate and treat the cyst.   You may need to come back or make an appointment with another caregiver, to find the exact cause of your cyst, if your caregiver is not a gynecologist.   Get your yearly and recommended pelvic examinations and Pap tests.   Let your caregiver know if you have had an ovarian cyst in the past.  SEEK MEDICAL CARE IF:   Your periods are late, irregular, they stop, or are painful.   Your stomach (abdomen) or pelvic pain does not go away.   Your stomach becomes larger or swollen.   You have pressure on your bladder or trouble emptying your bladder completely.   You have painful sexual intercourse.   You have feelings of fullness, pressure, or discomfort in your stomach.   You lose weight for no apparent reason.   You feel generally ill.   You become constipated.   You lose your appetite.   You develop acne.   You have an increase in body and facial hair.   You are gaining weight, without changing your exercise and eating habits.   You think you are pregnant.  SEEK IMMEDIATE MEDICAL CARE IF:   You have increasing abdominal pain.   You feel sick to your stomach (nausea) and/or vomit.   You develop a fever that comes on suddenly.   You develop abdominal pain during a bowel movement.   Your menstrual periods become heavier than usual.  Document Released: 10/30/2005 Document Revised: 07/12/2011 Document Reviewed: 09/02/2009 [employer] Patient Information 2012 [employer]. 

## 2011-09-07 NOTE — Progress Notes (Signed)
History:  25 yo G3P2012 here today c/o lower pelvic pain that radiated to back.  Was found to have a right adnexal cyst in July, she feels like the cyst is causing pain. Also c/o pain with urination and right flank pain.  No other concerns.  She does want to start OCPs for contraception  The following portions of the patient's history were reviewed and updated as appropriate: allergies, current medications, past family history, past medical history, past social history, past surgical history and problem list. Up to date with pap which was done March 2012.  Objective:  Physical Exam Blood pressure 106/73, pulse 74, temperature 97 F (36.1 C), temperature source Oral, height 5' 1.75" (1.568 m), weight 165 lb 4.8 oz (74.98 kg), last menstrual period 09/06/2011. Gen: NAD Abd: Soft, lower abdominal tenderness and R flank tenderness Pelvic: Normal appearing external genitalia; normal appearing vaginal mucosa and cervix.  Small uterus, adnexa not able to be palpated.  Patient had generalized discomfort with the pelvic examination.  Labs and Imaging 09/07/11 UA with large blood (but patient is on her period)  Assessment & Plan:  Will obtain pelvic ultrasound and renal ultrasound and manage according to results Will give flu shot today; consent signed OCPs and Ibuprofen e-prescribed. RTC to discuss results.

## 2011-09-13 ENCOUNTER — Ambulatory Visit (HOSPITAL_COMMUNITY)
Admission: RE | Admit: 2011-09-13 | Discharge: 2011-09-13 | Disposition: A | Discharge status: Home / Self Care | Payer: Self-pay | Source: Ambulatory Visit | Attending: Obstetrics & Gynecology | Admitting: Obstetrics & Gynecology

## 2011-09-13 DIAGNOSIS — N9489 Other specified conditions associated with female genital organs and menstrual cycle: Secondary | ICD-10-CM | POA: Insufficient documentation

## 2011-09-13 DIAGNOSIS — N838 Other noninflammatory disorders of ovary, fallopian tube and broad ligament: Secondary | ICD-10-CM | POA: Insufficient documentation

## 2011-09-13 DIAGNOSIS — N949 Unspecified condition associated with female genital organs and menstrual cycle: Secondary | ICD-10-CM | POA: Insufficient documentation

## 2011-09-13 DIAGNOSIS — R319 Hematuria, unspecified: Secondary | ICD-10-CM | POA: Insufficient documentation

## 2011-09-13 DIAGNOSIS — R102 Pelvic and perineal pain: Secondary | ICD-10-CM

## 2011-09-13 DIAGNOSIS — R109 Unspecified abdominal pain: Secondary | ICD-10-CM | POA: Insufficient documentation

## 2011-09-13 DIAGNOSIS — N83209 Unspecified ovarian cyst, unspecified side: Secondary | ICD-10-CM

## 2011-10-26 ENCOUNTER — Ambulatory Visit (INDEPENDENT_AMBULATORY_CARE_PROVIDER_SITE_OTHER): Payer: Self-pay | Admitting: Obstetrics & Gynecology

## 2011-10-26 VITALS — BP 124/78 | HR 71 | Temp 97.1°F | Ht 62.0 in | Wt 165.2 lb

## 2011-10-26 DIAGNOSIS — N949 Unspecified condition associated with female genital organs and menstrual cycle: Secondary | ICD-10-CM

## 2011-10-26 DIAGNOSIS — R102 Pelvic and perineal pain: Secondary | ICD-10-CM

## 2011-10-26 LAB — POCT URINALYSIS DIP (DEVICE)
Bilirubin Urine: NEGATIVE
Glucose, UA: NEGATIVE mg/dL
Ketones, ur: NEGATIVE mg/dL
Leukocytes, UA: NEGATIVE
Nitrite: NEGATIVE

## 2011-10-26 NOTE — Progress Notes (Signed)
History:  25 yo [id number] here to follow up ultrasound results.  She was seen 09/07/11 for evaluation of lower pelvic pain that radiated to right flank pain.  Was found to have a right adnexal cyst in July, she feels like the cyst is causing pain.  No other concerns.  She is on OCPs for contraception  The following portions of the patient's history were reviewed and updated as appropriate: allergies, current medications, past family history, past medical history, past social history, past surgical history and problem list. Up to date with pap which was done March 2012.  Objective:  Physical Exam Blood pressure 124/78, pulse 71, temperature 97.1 F (36.2 C), temperature source Oral, height 5\' 2"  (1.575 m), weight 165 lb 3.2 oz (74.934 kg), last menstrual period 10/09/2011. Gen: NAD Abd: Soft, lower abdominal tenderness and R flank tenderness Pelvic: Deferred  Imaging (09/13/11) RENAL/URINARY TRACT ULTRASOUND COMPLETE  Right Kidney: Measures 12.5 cm. No mass or hydronephrosis. Left Kidney: Measures 12.8 cm. No mass or hydronephrosis. Bladder: Underdistended but unremarkable.  IMPRESSION: Normal sonographic appearance of the bilateral kidneys. No hydronephrosis.  TRANSVAGINAL ULTRASOUND OF PELVIS  Uterus: Normal in size and contour. Measures 8.6 x 4.3 x 6.3 cm. Myometrium is heterogeneous, but there is no focal mass. The junctional zone between the endometrium and myometrium is  indistinct. These findings raise the possibility of adenomyosis.  Endometrium: Normal in thickness and appearance. Measures 4 mm. Right ovary: Measures 4.3 x 2.7 x 2.1 cm. Contains several  follicles. There is no cyst or solid mass. The previously described right adnexal cystic area has resolved. Left ovary: Measures 3.3 x 1.6 x 2.2 cm. Contains several follicles. No cyst or solid mass.  Other Findings: No free fluid. No adnexal masses. No evidence of dilated fallopian tubes.  IMPRESSION: 1. Both ovaries/adnexae are within  normal limits. Previously described right adnexal/ovarian cyst or fluid collection has resolved.  2. Possible uterine adenomyosis.  Urinalysis 10/26/11  Negative for infection  Assessment & Plan:  Normal pelvic ultrasound and renal ultrasound. Normal UA.  Pain not likely of GYN etiology; could be GU given suprapubic pain and flank pain.  Will give her contact information for [facility].  Continue OCPs and Ibuprofen as recommended.  Annual exam due in 01/2011.

## 2011-10-26 NOTE — Patient Instructions (Addendum)
Return to clinic for any gynecologic concerns or for annual exam.

## 2012-08-13 ENCOUNTER — Emergency Department (HOSPITAL_COMMUNITY)
Admission: EM | Admit: 2012-08-13 | Discharge: 2012-08-14 | Disposition: A | Discharge status: Home / Self Care | Payer: Self-pay | Attending: Emergency Medicine | Admitting: Emergency Medicine

## 2012-08-13 ENCOUNTER — Emergency Department (HOSPITAL_COMMUNITY): Payer: Self-pay

## 2012-08-13 ENCOUNTER — Encounter (HOSPITAL_COMMUNITY): Payer: Self-pay | Admitting: *Deleted

## 2012-08-13 DIAGNOSIS — R300 Dysuria: Secondary | ICD-10-CM | POA: Insufficient documentation

## 2012-08-13 DIAGNOSIS — R102 Pelvic and perineal pain: Secondary | ICD-10-CM

## 2012-08-13 DIAGNOSIS — R109 Unspecified abdominal pain: Secondary | ICD-10-CM | POA: Insufficient documentation

## 2012-08-13 DIAGNOSIS — F419 Anxiety disorder, unspecified: Secondary | ICD-10-CM

## 2012-08-13 DIAGNOSIS — F411 Generalized anxiety disorder: Secondary | ICD-10-CM | POA: Insufficient documentation

## 2012-08-13 DIAGNOSIS — N898 Other specified noninflammatory disorders of vagina: Secondary | ICD-10-CM | POA: Insufficient documentation

## 2012-08-13 DIAGNOSIS — N949 Unspecified condition associated with female genital organs and menstrual cycle: Secondary | ICD-10-CM | POA: Insufficient documentation

## 2012-08-13 DIAGNOSIS — N390 Urinary tract infection, site not specified: Secondary | ICD-10-CM | POA: Insufficient documentation

## 2012-08-13 DIAGNOSIS — R110 Nausea: Secondary | ICD-10-CM | POA: Insufficient documentation

## 2012-08-13 DIAGNOSIS — R350 Frequency of micturition: Secondary | ICD-10-CM | POA: Insufficient documentation

## 2012-08-13 LAB — URINALYSIS, ROUTINE W REFLEX MICROSCOPIC
Bilirubin Urine: NEGATIVE
Glucose, UA: NEGATIVE mg/dL
Ketones, ur: NEGATIVE mg/dL
Nitrite: NEGATIVE
Protein, ur: NEGATIVE mg/dL
Specific Gravity, Urine: 1.029 (ref 1.005–1.030)
Urobilinogen, UA: 1 mg/dL (ref 0.0–1.0)
pH: 6 (ref 5.0–8.0)

## 2012-08-13 LAB — CBC WITH DIFFERENTIAL/PLATELET
Basophils Absolute: 0 10*3/uL (ref 0.0–0.1)
Basophils Relative: 0 % (ref 0–1)
Hemoglobin: 12.3 g/dL (ref 12.0–15.0)
MCHC: 34.2 g/dL (ref 30.0–36.0)
Monocytes Relative: 4 % (ref 3–12)
Neutro Abs: 5.5 10*3/uL (ref 1.7–7.7)
Neutrophils Relative %: 66 % (ref 43–77)
RDW: 12.8 % (ref 11.5–15.5)

## 2012-08-13 LAB — COMPREHENSIVE METABOLIC PANEL
ALT: 36 U/L — ABNORMAL HIGH (ref 0–35)
AST: 18 U/L (ref 0–37)
Albumin: 3.9 g/dL (ref 3.5–5.2)
Alkaline Phosphatase: 75 U/L (ref 39–117)
Chloride: 101 mEq/L (ref 96–112)
Potassium: 3.6 mEq/L (ref 3.5–5.1)
Total Bilirubin: 0.6 mg/dL (ref 0.3–1.2)

## 2012-08-13 LAB — URINE MICROSCOPIC-ADD ON

## 2012-08-13 LAB — PREGNANCY, URINE: Preg Test, Ur: NEGATIVE

## 2012-08-13 MED ORDER — ONDANSETRON 4 MG PO TBDP
4.0000 mg | ORAL_TABLET | Freq: Once | ORAL | Status: AC
  Administered 2012-08-13: 4 mg via ORAL
  Filled 2012-08-13: qty 1

## 2012-08-13 MED ORDER — OXYCODONE-ACETAMINOPHEN 5-325 MG PO TABS
1.0000 | ORAL_TABLET | Freq: Once | ORAL | Status: AC
  Administered 2012-08-13: 1 via ORAL
  Filled 2012-08-13: qty 1

## 2012-08-13 MED ORDER — LORAZEPAM 1 MG PO TABS
0.5000 mg | ORAL_TABLET | Freq: Once | ORAL | Status: AC
  Administered 2012-08-14: 0.5 mg via ORAL
  Filled 2012-08-13: qty 1

## 2012-08-13 NOTE — ED Provider Notes (Signed)
History     CSN: [id number]  Arrival date & time 08/13/12  1549   First MD Initiated Contact with Patient 08/13/12 2153      Chief Complaint  Patient presents with  . multiple symptoms     (Consider location/radiation/quality/duration/timing/severity/associated sxs/prior treatment) Patient is a 26 y.o. female presenting with abdominal pain. The history is provided by the patient.  Abdominal Pain The primary symptoms of the illness include abdominal pain, nausea and dysuria. The primary symptoms of the illness do not include fever, shortness of breath, vomiting, diarrhea, vaginal discharge or vaginal bleeding. Episode onset: 2-4 days ago. The onset of the illness was gradual. The problem has been gradually worsening.  The abdominal pain is located in the suprapubic region. The abdominal pain radiates to the left flank and right flank. The severity of the abdominal pain is 7/10. The abdominal pain is relieved by nothing. Exacerbated by: in the last few days urinating makes pain worse.  The dysuria began 3 to 5 days ago. The discomfort is moderate. She is currently sexually active. The dysuria is associated with frequency. The dysuria is not associated with scrotal pain.  The patient states that she believes she is currently not pregnant. The patient has not had a change in bowel habit. Additional symptoms associated with the illness include frequency. Symptoms associated with the illness do not include chills or anorexia. Associated medical issues comments: prior hx of ovarian cysts that feel similar to this.    History reviewed. No pertinent past medical history.  History reviewed. No pertinent past surgical history.  No family history on file.  History  Substance Use Topics  . Smoking status: Never Smoker   . Smokeless tobacco: Not on file  . Alcohol Use: Yes    OB History    Grav Para Term Preterm Abortions TAB SAB Ect Mult Living                  Review of Systems    Constitutional: Negative for fever and chills.  Respiratory: Negative for shortness of breath.   Gastrointestinal: Positive for nausea and abdominal pain. Negative for vomiting, diarrhea and anorexia.  Genitourinary: Positive for dysuria and frequency. Negative for vaginal bleeding and vaginal discharge.  All other systems reviewed and are negative.    Allergies  Review of patient's allergies indicates no known allergies.  Home Medications  No current outpatient prescriptions on file.  BP 121/82  Pulse 91  Temp 98.4 F (36.9 C) (Oral)  Resp 18  SpO2 100%  LMP 07/25/2012  Physical Exam  Nursing note and vitals reviewed. Constitutional: She is oriented to person, place, and time. She appears well-developed and well-nourished. No distress.  HENT:  Head: Normocephalic and atraumatic.  Mouth/Throat: Oropharynx is clear and moist.  Eyes: Conjunctivae normal and EOM are normal. Pupils are equal, round, and reactive to light.  Neck: Normal range of motion. Neck supple.  Cardiovascular: Normal rate, regular rhythm and intact distal pulses.   No murmur heard. Pulmonary/Chest: Effort normal and breath sounds normal. No respiratory distress. She has no wheezes. She has no rales.  Abdominal: Soft. Normal appearance. She exhibits no distension. There is tenderness in the suprapubic area. There is CVA tenderness. There is no rebound and no guarding.  Genitourinary: Uterus normal. Cervix exhibits motion tenderness, discharge and friability. Right adnexum displays no mass and no tenderness. Left adnexum displays no mass and no tenderness. Vaginal discharge found.  Musculoskeletal: Normal range of motion. She exhibits no edema  and no tenderness.  Neurological: She is alert and oriented to person, place, and time.  Skin: Skin is warm and dry. No rash noted. No erythema.  Psychiatric: She has a normal mood and affect. Her behavior is normal.    ED Course  Procedures (including critical care  time)  Labs Reviewed  URINALYSIS, ROUTINE W REFLEX MICROSCOPIC - Abnormal; Notable for the following:    APPearance CLOUDY (*)     Hgb urine dipstick TRACE (*)     Leukocytes, UA MODERATE (*)     All other components within normal limits  COMPREHENSIVE METABOLIC PANEL - Abnormal; Notable for the following:    Glucose, Bld 108 (*)     Creatinine, Ser 0.49 (*)     ALT 36 (*)     All other components within normal limits  URINE MICROSCOPIC-ADD ON - Abnormal; Notable for the following:    Squamous Epithelial / LPF MANY (*)     Bacteria, UA FEW (*)     All other components within normal limits  PREGNANCY, URINE  CBC WITH DIFFERENTIAL  LIPASE, BLOOD  WET PREP, GENITAL  GC/CHLAMYDIA PROBE AMP, GENITAL   Dg Chest 2 View  08/14/2012  *RADIOLOGY REPORT*  Clinical Data: Chest pain and shortness of breath  CHEST - 2 VIEW  Comparison: 02/11/2010  Findings: The lungs are clear without focal consolidation, edema, effusion or pneumothorax.  Cardiopericardial silhouette is within normal limits for size.  Imaged bony structures of the thorax are intact.  IMPRESSION: Normal exam.   Original Report Authenticated By: ERIC A. MANSELL, M.D.    US Transvaginal Non-ob  08/13/2012  *RADIOLOGY REPORT*  Clinical Data: Pelvic pain  TRANSABDOMINAL AND TRANSVAGINAL ULTRASOUND OF PELVIS Technique:  Both transabdominal and transvaginal ultrasound examinations of the pelvis were performed. Transabdominal technique was performed for global imaging of the pelvis including uterus, ovaries, adnexal regions, and pelvic cul-de-sac.  It was necessary to proceed with endovaginal exam following the transabdominal exam to visualize the endometrium and adnexa.  Comparison:  12/30/2009  Findings:  Uterus: Upper normal in size at 8.1 x 4.8 x 6.8 cm.  Prominent parauterine vessels are noted.  Endometrium: Measures 3 mm, within normal limits.  Right ovary:  Measures 2.9 x 2.4 x 1.8 cm.  Normal echogenicity.  Left ovary: Measures 3.2 x  1.7 x 2.9 cm.  Normal echogenicity.  Other findings: No free fluid.  IMPRESSION: Mildly prominent parauterine vessels can be a normal finding or seen in the setting of pelvic congestion syndrome.  Otherwise, normal pelvic ultrasound.   Original Report Authenticated By: Waneta Martins, M.D.    US Pelvis Complete  08/13/2012  *RADIOLOGY REPORT*  Clinical Data: Pelvic pain  TRANSABDOMINAL AND TRANSVAGINAL ULTRASOUND OF PELVIS Technique:  Both transabdominal and transvaginal ultrasound examinations of the pelvis were performed. Transabdominal technique was performed for global imaging of the pelvis including uterus, ovaries, adnexal regions, and pelvic cul-de-sac.  It was necessary to proceed with endovaginal exam following the transabdominal exam to visualize the endometrium and adnexa.  Comparison:  12/30/2009  Findings:  Uterus: Upper normal in size at 8.1 x 4.8 x 6.8 cm.  Prominent parauterine vessels are noted.  Endometrium: Measures 3 mm, within normal limits.  Right ovary:  Measures 2.9 x 2.4 x 1.8 cm.  Normal echogenicity.  Left ovary: Measures 3.2 x 1.7 x 2.9 cm.  Normal echogenicity.  Other findings: No free fluid.  IMPRESSION: Mildly prominent parauterine vessels can be a normal finding or seen in the  setting of pelvic congestion syndrome.  Otherwise, normal pelvic ultrasound.   Original Report Authenticated By: Waneta Martins, M.D.     Date: 08/14/2012  Rate: 75  Rhythm: normal sinus rhythm  QRS Axis: normal  Intervals: normal  ST/T Wave abnormalities: normal  Conduction Disutrbances: none  Narrative Interpretation: unremarkable      No diagnosis found.    MDM   Patient complaining of pelvic pain for the last 3 days also with some pain with urination. She states the pain started in her lower abdomen but now has moved to her back. She denies any fever or or vomiting but has been nauseated. She states in the past she has had ovarian cyst which felt very similar. Patient does  have diffuse lower pelvic tenderness and some mild CVA tenderness bilaterally.  Vital signs within normal limits. Concern for possible UTI versus GU pathology. Patient denies any vaginal discharge and has irregular periods. Last menses was last month for 2 days.  CBC, CMP, lipase wnl.  UA with moderate leukocytes but contaminated sample.  Transvaginal u/s pending.   11:45 PM Transvag u/s with possible pelvic congestion syndrome.  O/w wnl.  Pt with sx suggestive of most likely UTI.  Also on re-eval pt c/o of palpations and feeling anxious with pain in the right side of chest.  Pt is PERC neg and low suspicion for PE.  No RUQ tenderness or concern for cholecystitis.  Will get EKG and CXR to r/o underlying pathology but pt is no tachy and improved after ativan.  Will treat for UTI.  Wet prep unremarkable.  Will have f/u with gyn if continued pelvic pain and dysparunia  Gwyneth Sprout, MD 08/14/12 [phone]

## 2012-08-13 NOTE — ED Notes (Signed)
The pt has alower abd pain headache cold and nasal congestion  With nv and painful urination for 3 days.  lmp last month

## 2012-08-14 ENCOUNTER — Other Ambulatory Visit: Payer: Self-pay

## 2012-08-14 LAB — WET PREP, GENITAL
Clue Cells Wet Prep HPF POC: NONE SEEN
WBC, Wet Prep HPF POC: NONE SEEN
Yeast Wet Prep HPF POC: NONE SEEN

## 2012-08-14 MED ORDER — NITROFURANTOIN MONOHYD MACRO 100 MG PO CAPS: 100.0000 mg | ORAL_CAPSULE | Freq: Two times a day (BID) | ORAL | Status: DC

## 2012-08-14 MED ORDER — HYDROCODONE-ACETAMINOPHEN 5-500 MG PO TABS: 1.0000 | ORAL_TABLET | Freq: Four times a day (QID) | ORAL | Status: DC | PRN

## 2012-08-14 NOTE — ED Notes (Signed)
Pt to ED c/o pain with urination, inguinal pain and lower back pain for several days.  States it feels better to place a pillow under back.

## 2012-08-14 NOTE — ED Notes (Signed)
Patient transported to Ultrasound 

## 2012-08-14 NOTE — ED Notes (Signed)
Pt back from [place].  Per MD, pt anxious and expressing chest pain.  Xray and ekg ordered.

## 2012-10-30 ENCOUNTER — Inpatient Hospital Stay (HOSPITAL_COMMUNITY)
Admission: AD | Admit: 2012-10-30 | Discharge: 2012-10-30 | Disposition: A | Discharge status: Home / Self Care | Payer: Self-pay | Source: Ambulatory Visit | Attending: Obstetrics & Gynecology | Admitting: Obstetrics & Gynecology

## 2012-10-30 ENCOUNTER — Inpatient Hospital Stay (HOSPITAL_COMMUNITY): Payer: Self-pay

## 2012-10-30 ENCOUNTER — Encounter (HOSPITAL_COMMUNITY): Payer: Self-pay

## 2012-10-30 DIAGNOSIS — O200 Threatened abortion: Secondary | ICD-10-CM | POA: Insufficient documentation

## 2012-10-30 DIAGNOSIS — M549 Dorsalgia, unspecified: Secondary | ICD-10-CM | POA: Insufficient documentation

## 2012-10-30 DIAGNOSIS — R109 Unspecified abdominal pain: Secondary | ICD-10-CM | POA: Insufficient documentation

## 2012-10-30 LAB — URINALYSIS, ROUTINE W REFLEX MICROSCOPIC
Glucose, UA: NEGATIVE mg/dL
Ketones, ur: NEGATIVE mg/dL
Protein, ur: NEGATIVE mg/dL

## 2012-10-30 LAB — WET PREP, GENITAL
Clue Cells Wet Prep HPF POC: NONE SEEN
Trich, Wet Prep: NONE SEEN

## 2012-10-30 LAB — URINE MICROSCOPIC-ADD ON

## 2012-10-30 LAB — CBC
HCT: 34.7 % — ABNORMAL LOW (ref 36.0–46.0)
Hemoglobin: 11.7 g/dL — ABNORMAL LOW (ref 12.0–15.0)
MCH: 28.9 pg (ref 26.0–34.0)
MCHC: 33.7 g/dL (ref 30.0–36.0)
MCV: 85.7 fL (ref 78.0–100.0)
RDW: 12.9 % (ref 11.5–15.5)

## 2012-10-30 LAB — POCT PREGNANCY, URINE: Preg Test, Ur: POSITIVE — AB

## 2012-10-30 NOTE — MAU Provider Note (Signed)
Chief Complaint: Headache, Abdominal Pain and Back Pain   First Provider Initiated Contact with Patient 10/30/12 1339     SUBJECTIVE HPI: Debra Allen is a 26 y.o. G3P2000 at [redacted]w[redacted]d by LMP who presents to maternity admissions reporting burning lower abdominal pain and low back pain x3 days, worsening yesterday.  She reports dysuria since yesterday also. She has irregular periods and is unsure of her LMP.  She has not yet gotten care in this pregnancy and was told to call the health department again in January to try to make an appointment.  She saw the [facility] with her last pregnancy and is interested in begin seen here again. She denies vaginal bleeding, vaginal itching/burning, h/a, dizziness, n/v, or fever/chills.     History reviewed. No pertinent past medical history. History reviewed. No pertinent past surgical history. History   Social History  . Marital Status: Single    Spouse Name: N/A    Number of Children: N/A  . Years of Education: N/A   Occupational History  . Not on file.   Social History Main Topics  . Smoking status: Never Smoker   . Smokeless tobacco: Not on file  . Alcohol Use: No  . Drug Use: No  . Sexually Active: Yes    Birth Control/ Protection: None   Other Topics Concern  . Not on file   Social History Narrative  . No narrative on file   No current facility-administered medications on file prior to encounter.   No current outpatient prescriptions on file prior to encounter.   No Known Allergies  ROS: Pertinent items in HPI  OBJECTIVE Blood pressure 116/67, pulse 84, temperature 97.8 F (36.6 C), temperature source Oral, resp. rate 16, height 5\' 1"  (1.549 m), weight 73.483 kg (162 lb), last menstrual period 08/13/2012. GENERAL: Well-developed, well-nourished female in no acute distress.  HEENT: Normocephalic HEART: normal rate RESP: normal effort ABDOMEN: Soft, non-tender EXTREMITIES: Nontender, no edema NEURO: Alert and  oriented Pelvic exam: Cervix pink, visually closed, without lesion, friable with significant ectropion, scant white creamy discharge, vaginal walls and external genitalia normal Bimanual exam: Cervix 0/long/high, firm, anterior, neg CMT, uterus tender, nonenlarged, adnexa mildly tender bilaterally,  without enlargement or mass  LAB RESULTS Results for orders placed during the hospital encounter of 10/30/12 (from the past 24 hour(s))  URINALYSIS, ROUTINE W REFLEX MICROSCOPIC     Status: Abnormal   Collection Time   10/30/12  1:05 PM      Component Value Range   Color, Urine YELLOW  YELLOW   APPearance HAZY (*) CLEAR   Specific Gravity, Urine >1.030 (*) 1.005 - 1.030   pH 6.0  5.0 - 8.0   Glucose, UA NEGATIVE  NEGATIVE mg/dL   Hgb urine dipstick TRACE (*) NEGATIVE   Bilirubin Urine NEGATIVE  NEGATIVE   Ketones, ur NEGATIVE  NEGATIVE mg/dL   Protein, ur NEGATIVE  NEGATIVE mg/dL   Urobilinogen, UA 1.0  0.0 - 1.0 mg/dL   Nitrite NEGATIVE  NEGATIVE   Leukocytes, UA NEGATIVE  NEGATIVE  URINE MICROSCOPIC-ADD ON     Status: Abnormal   Collection Time   10/30/12  1:05 PM      Component Value Range   Squamous Epithelial / LPF FEW (*) RARE   WBC, UA 0-2  <3 WBC/hpf   RBC / HPF 0-2  <3 RBC/hpf   Bacteria, UA FEW (*) RARE   Urine-Other AMORPHOUS URATES/PHOSPHATES    POCT PREGNANCY, URINE     Status: Abnormal  Collection Time   10/30/12  1:11 PM      Component Value Range   Preg Test, Ur POSITIVE (*) NEGATIVE  CBC     Status: Abnormal   Collection Time   10/30/12  2:05 PM      Component Value Range   WBC 6.6  4.0 - 10.5 K/uL   RBC 4.05  3.87 - 5.11 MIL/uL   Hemoglobin 11.7 (*) 12.0 - 15.0 g/dL   HCT 16.1 (*) 09.6 - 04.5 %   MCV 85.7  78.0 - 100.0 fL   MCH 28.9  26.0 - 34.0 pg   MCHC 33.7  30.0 - 36.0 g/dL   RDW 40.9  81.1 - 91.4 %   Platelets 194  150 - 400 K/uL  HCG, QUANTITATIVE, PREGNANCY     Status: Abnormal   Collection Time   10/30/12  2:05 PM      Component Value Range    hCG, Beta Chain, Quant, S 372 (*) <5 mIU/mL  WET PREP, GENITAL     Status: Abnormal   Collection Time   10/30/12  2:05 PM      Component Value Range   Yeast Wet Prep HPF POC NONE SEEN  NONE SEEN   Trich, Wet Prep NONE SEEN  NONE SEEN   Clue Cells Wet Prep HPF POC NONE SEEN  NONE SEEN   WBC, Wet Prep HPF POC FEW (*) NONE SEEN    IMAGING   ASSESSMENT 1. Threatened miscarriage in early pregnancy     PLAN Discharge home with ectopic and bleeding precautions Discussed poor prognosis with pt and s/o, offered Spanish translator for medical results, but pt declined need for this.  Pt reports understanding and will return on Friday for labs.  Ibuprofen for pain Return to MAU on Friday for quant hcg Return to MAU as needed   Sharen Counter Certified Nurse-Midwife 10/30/2012  1:58 PM

## 2012-10-30 NOTE — MAU Note (Signed)
Pt states since yesterday has had lower abd pain, back pain and headache. Was at work and started feeling dizzy and came here for further eval. Denies vaginal bleeding, however did note slight blood in her urine this am. Burning with urination as well. +CVA tenderness bilaterally.

## 2012-10-31 LAB — GC/CHLAMYDIA PROBE AMP: CT Probe RNA: NEGATIVE

## 2012-11-01 ENCOUNTER — Inpatient Hospital Stay (HOSPITAL_COMMUNITY)
Admission: AD | Admit: 2012-11-01 | Discharge: 2012-11-01 | Disposition: A | Discharge status: Home / Self Care | Payer: Self-pay | Source: Ambulatory Visit | Attending: Obstetrics & Gynecology | Admitting: Obstetrics & Gynecology

## 2012-11-01 ENCOUNTER — Encounter (HOSPITAL_COMMUNITY): Payer: Self-pay

## 2012-11-01 DIAGNOSIS — R109 Unspecified abdominal pain: Secondary | ICD-10-CM

## 2012-11-01 DIAGNOSIS — O99891 Other specified diseases and conditions complicating pregnancy: Secondary | ICD-10-CM | POA: Insufficient documentation

## 2012-11-01 LAB — HCG, QUANTITATIVE, PREGNANCY: hCG, Beta Chain, Quant, S: 1040 m[IU]/mL — ABNORMAL HIGH (ref <5)

## 2012-11-01 NOTE — Progress Notes (Signed)
S:  Pt is a G3P2002 at 4.6 wks IUP here for follow-up BHCG.  Reports mild abdominal pain.  Ultrasound on 10/28/12 showed probable IUGS and large subchorionic hemorrhage.  BHCG 372.  O:   Filed Vitals:   11/01/12 1558  BP: 109/63  Pulse: 84  Temp: 98.2 F (36.8 C)  Resp: 16   General:  A&Ox3; NAD Results for orders placed during the hospital encounter of 11/01/12 (from the past 24 hour(s))  HCG, QUANTITATIVE, PREGNANCY     Status: Abnormal   Collection Time   11/01/12  2:45 PM      Component Value Range   hCG, Beta Chain, Quant, S 1040 (*) <5 mIU/mL   A:   Pregnancy  P: Ectopic precautions Repeat ultrasound on 11/07/12 at 10:30 for viability [facility]

## 2012-11-01 NOTE — MAU Note (Signed)
Pt here for repeat bhcg only, denies bleeding, still having abd pain, however has improved a small amount.

## 2012-11-07 ENCOUNTER — Inpatient Hospital Stay (HOSPITAL_COMMUNITY): Payer: Self-pay

## 2012-11-07 ENCOUNTER — Ambulatory Visit (HOSPITAL_COMMUNITY)
Admission: RE | Admit: 2012-11-07 | Discharge: 2012-11-07 | Disposition: A | Discharge status: Home / Self Care | Payer: Self-pay | Source: Ambulatory Visit | Attending: Family | Admitting: Family

## 2012-11-07 ENCOUNTER — Inpatient Hospital Stay (HOSPITAL_COMMUNITY)
Admission: AD | Admit: 2012-11-07 | Discharge: 2012-11-07 | Disposition: A | Discharge status: Home / Self Care | Payer: Self-pay | Source: Ambulatory Visit | Attending: Obstetrics & Gynecology | Admitting: Obstetrics & Gynecology

## 2012-11-07 DIAGNOSIS — R109 Unspecified abdominal pain: Secondary | ICD-10-CM | POA: Insufficient documentation

## 2012-11-07 DIAGNOSIS — O99891 Other specified diseases and conditions complicating pregnancy: Secondary | ICD-10-CM | POA: Insufficient documentation

## 2012-11-07 DIAGNOSIS — Z331 Pregnant state, incidental: Secondary | ICD-10-CM

## 2012-11-07 DIAGNOSIS — O3680X0 Pregnancy with inconclusive fetal viability, not applicable or unspecified: Secondary | ICD-10-CM | POA: Insufficient documentation

## 2012-11-07 NOTE — MAU Provider Note (Signed)
Attestation of Attending Supervision of Advanced Practitioner (CNM/NP): Evaluation and management procedures were performed by the Advanced Practitioner under my supervision and collaboration.  I have reviewed the Advanced Practitioner's note and chart, and I agree with the management and plan.  HARRAWAY-SMITH, Aniella Wandrey 4:09 PM     

## 2012-11-07 NOTE — MAU Provider Note (Signed)
  History     CSN: [id number]  Arrival date and time: 11/07/12 1125   None     Chief Complaint  Patient presents with  . Follow-up   HPI  Here for a FU ultrasound. She had a gestational sac measuring 4 weeks a  Week ago, and now there is still a gestational sac, but it has only been a week since the 1st ultrasound.  Denies any pain or bleeding.   Past Medical History  Diagnosis Date  . No pertinent past medical history   . Depression     post partum G1 & has appt to be seen for post partum depression now  . Adnexal cyst     Past Surgical History  Procedure Date  . No past surgeries     Family History  Problem Relation Age of Onset  . Hyperlipidemia Mother   . Cancer Father     History  Substance Use Topics  . Smoking status: Never Smoker   . Smokeless tobacco: Never Used  . Alcohol Use: No    Allergies: No Known Allergies  Prescriptions prior to admission  Medication Sig Dispense Refill  . norgestimate-ethinyl estradiol (ORTHO-CYCLEN,SPRINTEC,PREVIFEM) 0.25-35 MG-MCG tablet Take 1 tablet by mouth daily.  1 Package  11    ROS Physical Exam   Blood pressure 114/68, pulse 72, temperature 98.6 F (37 C), temperature source Oral, resp. rate 16, last menstrual period 08/18/2012.  Physical Exam  MAU Course  Procedures    Assessment and Plan   1. Pregnant state, incidental    [place] too early to confirm viability Repeat ultrasound in 4 weeks 1st trimester danger signs reviewed Start [facility] ASAP   Tawnya Crook 11/07/2012, 12:00 PM

## 2012-11-07 NOTE — MAU Note (Signed)
Patient to MAU after ultrasound for viability. Patient denies bleeding but does have a little lower abdominal pain with movement.

## 2012-11-13 NOTE — L&D Delivery Note (Signed)
Delivery Note At 5:23 AM a viable and healthy female was delivered via Vaginal, Spontaneous Delivery (Presentation: ; Occiput Anterior).  APGAR: 8, 9; weight 9 lb 9.3 oz (4345 g).   Placenta status: Intact, Spontaneous.  Cord: 3 vessels with the following complications: None.    Anesthesia: None  Episiotomy: None Lacerations: None Est. Blood Loss (mL): 350  Mom to postpartum.  Baby to nursery-stable.  [facility] 07/13/2013, 6:06 AM

## 2012-11-13 NOTE — L&D Delivery Note (Signed)
Attestation of Attending Supervision of Advanced Practitioner (CNM/NP): Evaluation and management procedures were performed by the Advanced Practitioner under my supervision and collaboration.  I have reviewed the Advanced Practitioner's note and chart, and I agree with the management and plan.  Amillya Chavira 07/16/2013 1:33 PM

## 2012-11-19 ENCOUNTER — Inpatient Hospital Stay (HOSPITAL_COMMUNITY): Payer: Self-pay

## 2012-11-19 ENCOUNTER — Inpatient Hospital Stay (HOSPITAL_COMMUNITY)
Admission: AD | Admit: 2012-11-19 | Discharge: 2012-11-19 | Disposition: A | Discharge status: Home / Self Care | Payer: Self-pay | Source: Ambulatory Visit | Attending: Obstetrics & Gynecology | Admitting: Obstetrics & Gynecology

## 2012-11-19 DIAGNOSIS — O418X90 Other specified disorders of amniotic fluid and membranes, unspecified trimester, not applicable or unspecified: Secondary | ICD-10-CM

## 2012-11-19 DIAGNOSIS — O26859 Spotting complicating pregnancy, unspecified trimester: Secondary | ICD-10-CM

## 2012-11-19 DIAGNOSIS — O209 Hemorrhage in early pregnancy, unspecified: Secondary | ICD-10-CM

## 2012-11-19 DIAGNOSIS — N949 Unspecified condition associated with female genital organs and menstrual cycle: Secondary | ICD-10-CM | POA: Insufficient documentation

## 2012-11-19 DIAGNOSIS — R109 Unspecified abdominal pain: Secondary | ICD-10-CM | POA: Insufficient documentation

## 2012-11-19 LAB — URINALYSIS, ROUTINE W REFLEX MICROSCOPIC
Bilirubin Urine: NEGATIVE
Glucose, UA: NEGATIVE mg/dL
Ketones, ur: NEGATIVE mg/dL
Nitrite: NEGATIVE
Specific Gravity, Urine: 1.015 (ref 1.005–1.030)
pH: 8 (ref 5.0–8.0)

## 2012-11-19 LAB — CBC
HCT: 36.4 % (ref 36.0–46.0)
MCH: 29.7 pg (ref 26.0–34.0)
MCV: 86.5 fL (ref 78.0–100.0)
RBC: 4.21 MIL/uL (ref 3.87–5.11)
WBC: 9 10*3/uL (ref 4.0–10.5)

## 2012-11-19 LAB — URINE MICROSCOPIC-ADD ON

## 2012-11-19 MED ORDER — OXYCODONE-ACETAMINOPHEN 5-325 MG PO TABS
ORAL_TABLET | ORAL | Status: AC
  Administered 2012-11-19: 1
  Filled 2012-11-19: qty 1

## 2012-11-19 MED ORDER — OXYCODONE-ACETAMINOPHEN 5-325 MG PO TABS: 1.0000 | ORAL_TABLET | Freq: Once | ORAL | Status: DC

## 2012-11-19 NOTE — MAU Note (Signed)
Patient is in with c/o intermittent generalized abdominal pain with sharp lower abdominal pain when she walks. She also c/o n/v last emesis this morning and dizziness. She states that the n/v started 2 days ago. She denies vaginal bleeding today but had light spotting yesterday.

## 2012-11-19 NOTE — MAU Note (Signed)
Been feeling bad, dizzy last few days, yesterday and today the worst.  Had some vag bleeding, noted once.  Today had a watery d/c.  Vomited twice today, not since 1200.  Pain is in lower abd

## 2012-11-19 NOTE — MAU Provider Note (Signed)
History     CSN: [id number]  Arrival date and time: 11/19/12 1715      Chief Complaint  Patient presents with  . Vaginal Bleeding  . Vaginal Discharge  . Emesis  . Abdominal Pain   Vaginal Bleeding The patient's primary symptoms include a vaginal discharge. Associated symptoms include abdominal pain, back pain, dysuria, frequency, headaches, urgency and vomiting. Pertinent negatives include no chills, constipation, diarrhea, fever, nausea, rash or sore throat.  Vaginal Discharge The patient's primary symptoms include a vaginal discharge. Associated symptoms include abdominal pain, back pain, dysuria, frequency, headaches, urgency and vomiting. Pertinent negatives include no chills, constipation, diarrhea, fever, nausea, rash or sore throat.  Emesis  Associated symptoms include abdominal pain and headaches. Pertinent negatives include no chest pain, chills, coughing, diarrhea, dizziness, fever, myalgias or weight loss.  Abdominal Pain Associated symptoms include dysuria, frequency, headaches and vomiting. Pertinent negatives include no constipation, diarrhea, fever, myalgias, nausea or weight loss.   Debra Allen is a 27 y.o. female @ [redacted]w[redacted]d gestation who presents to MAU with vaginal bleeding. The bleeding started 2 days ago. She describes the bleeding as less than a period. Associated symptoms include abdominal cramping that comes and goes. She rates the pain as 9/10. She has nausea and vomiting. She was evaluated here approximately 2 weeks ago and had ultrasound, pelvic exam and cultures. Cultures were negative. Ultrasound showed a very early IUGS. She is scheduled for follow up for 12/03/11. But the pain was to bad today to wait. The history was provided by the patient and her medical record.   OB History    Grav Para Term Preterm Abortions TAB SAB Ect Mult Living   3 2 2       2       No past medical history on file.  Past Surgical History  Procedure Date  . No past  surgeries     Family History  Problem Relation Age of Onset  . Other Neg Hx     History  Substance Use Topics  . Smoking status: Never Smoker   . Smokeless tobacco: Never Used  . Alcohol Use: No    Allergies: No Known Allergies  Prescriptions prior to admission  Medication Sig Dispense Refill  . Prenatal Vit-Fe Fumarate-FA (PRENATAL MULTIVITAMIN) TABS Take 1 tablet by mouth daily.        Review of Systems  Constitutional: Negative for fever, chills and weight loss.  HENT: Negative for ear pain, nosebleeds, congestion, sore throat and neck pain.   Eyes: Negative for blurred vision, double vision, photophobia and pain.  Respiratory: Negative for cough, shortness of breath and wheezing.   Cardiovascular: Negative for chest pain, palpitations and leg swelling.  Gastrointestinal: Positive for vomiting and abdominal pain. Negative for heartburn, nausea, diarrhea and constipation.  Genitourinary: Positive for dysuria, urgency, frequency, vaginal bleeding and vaginal discharge.  Musculoskeletal: Positive for back pain. Negative for myalgias.  Skin: Negative for itching and rash.  Neurological: Positive for headaches. Negative for dizziness, sensory change, speech change, seizures and weakness.  Endo/Heme/Allergies: Does not bruise/bleed easily.  Psychiatric/Behavioral: Negative for depression. The patient is not nervous/anxious and does not have insomnia.    Blood pressure 107/68, pulse 77, temperature 97.8 F (36.6 C), temperature source Oral, resp. rate 18, height 5\' 1"  (1.549 m), weight 74.844 kg (165 lb), last menstrual period 08/13/2012.  Physical Exam  Nursing note and vitals reviewed. Constitutional: She is oriented to person, place, and time. She appears well-developed and well-nourished. No distress.  HENT:  Head: Normocephalic and atraumatic.  Eyes: EOM are normal.  Neck: Neck supple.  Cardiovascular: Normal rate.   Respiratory: Effort normal.  GI: Soft. There is  tenderness in the right lower quadrant, suprapubic area and left lower quadrant. There is no rebound, no guarding and no CVA tenderness.  Musculoskeletal: Normal range of motion.  Neurological: She is alert and oriented to person, place, and time.  Skin: Skin is warm and dry.  Psychiatric: She has a normal mood and affect. Her behavior is normal. Judgment and thought content normal.   Procedures Results for orders placed during the hospital encounter of 11/19/12 (from the past 24 hour(s))  URINALYSIS, ROUTINE W REFLEX MICROSCOPIC     Status: Abnormal   Collection Time   11/19/12  5:35 PM      Component Value Range   Color, Urine YELLOW  YELLOW   APPearance CLOUDY (*) CLEAR   Specific Gravity, Urine 1.015  1.005 - 1.030   pH 8.0  5.0 - 8.0   Glucose, UA NEGATIVE  NEGATIVE mg/dL   Hgb urine dipstick TRACE (*) NEGATIVE   Bilirubin Urine NEGATIVE  NEGATIVE   Ketones, ur NEGATIVE  NEGATIVE mg/dL   Protein, ur NEGATIVE  NEGATIVE mg/dL   Urobilinogen, UA 0.2  0.0 - 1.0 mg/dL   Nitrite NEGATIVE  NEGATIVE   Leukocytes, UA MODERATE (*) NEGATIVE  URINE MICROSCOPIC-ADD ON     Status: Abnormal   Collection Time   11/19/12  5:35 PM      Component Value Range   Squamous Epithelial / LPF MANY (*) RARE   WBC, UA 3-6  <3 WBC/hpf   Bacteria, UA FEW (*) RARE  CBC     Status: Normal   Collection Time   11/19/12  7:11 PM      Component Value Range   WBC 9.0  4.0 - 10.5 K/uL   RBC 4.21  3.87 - 5.11 MIL/uL   Hemoglobin 12.5  12.0 - 15.0 g/dL   HCT 16.1  09.6 - 04.5 %   MCV 86.5  78.0 - 100.0 fL   MCH 29.7  26.0 - 34.0 pg   MCHC 34.3  30.0 - 36.0 g/dL   RDW 40.9  81.1 - 91.4 %   Platelets 216  150 - 400 K/uL    Patient in ultrasound @ 2000 and care turned over to Nolene Bernheim, FNP  Clinical Data: Spotting. Pelvic pain.  TRANSVAGINAL OBSTETRIC US  Technique: Transvaginal ultrasound was performed for complete  evaluation of the gestation as well as the maternal uterus, adnexal  regions, and  pelvic cul-de-sac.  Comparison: None.  Intrauterine gestational sac: Present.  Yolk sac: Present.  Embryo: Present.  Cardiac Activity: Present.  Heart Rate: 132  CRL: 10.5 millimeters 7 w 2 d [place] EDC:  07/06/2013  Subchorionic hemorrhage: Small.  Maternal uterus/adnexae:  Physiologic appearance of the ovaries with corpus luteum cyst on  the left. Corpus luteum cyst measures 17 mm long axis. No free  fluid.  IMPRESSION:  1. Single viable intrauterine pregnancy with estimated gestational  age of [redacted] weeks 2 days.  2. Small subchorionic hemorrhage.    Assessment Bleeding in pregnancy  Plan Take Tylenol 325 mg 2 tablets by mouth every 4 hours if needed for pain. Drink at least 8 8-oz glasses of water every day. Expect to have some bleeding and possible cramping as the subchorionic hemorrhage resolves. Begin prenatal care as soon as possible. Will cancel ultrasound scheduled later this month.  NEESE,HOPE, RN, FNP, [facility] 11/19/2012, 6:23 PM

## 2012-11-20 LAB — URINE CULTURE

## 2012-11-21 ENCOUNTER — Encounter: Payer: Self-pay | Admitting: Advanced Practice Midwife

## 2012-12-05 ENCOUNTER — Ambulatory Visit (HOSPITAL_COMMUNITY): Admission: RE | Admit: 2012-12-05 | Payer: Self-pay | Source: Ambulatory Visit

## 2012-12-25 ENCOUNTER — Other Ambulatory Visit: Payer: Self-pay | Admitting: Advanced Practice Midwife

## 2012-12-25 ENCOUNTER — Encounter: Payer: Self-pay | Admitting: Advanced Practice Midwife

## 2012-12-25 ENCOUNTER — Ambulatory Visit (INDEPENDENT_AMBULATORY_CARE_PROVIDER_SITE_OTHER): Payer: Self-pay | Admitting: Advanced Practice Midwife

## 2012-12-25 ENCOUNTER — Encounter: Payer: Self-pay | Admitting: *Deleted

## 2012-12-25 VITALS — BP 110/63 | Temp 97.2°F | Wt 157.0 lb

## 2012-12-25 DIAGNOSIS — Z3490 Encounter for supervision of normal pregnancy, unspecified, unspecified trimester: Secondary | ICD-10-CM

## 2012-12-25 DIAGNOSIS — Z3480 Encounter for supervision of other normal pregnancy, unspecified trimester: Secondary | ICD-10-CM

## 2012-12-25 DIAGNOSIS — Z23 Encounter for immunization: Secondary | ICD-10-CM

## 2012-12-25 DIAGNOSIS — Z3682 Encounter for antenatal screening for nuchal translucency: Secondary | ICD-10-CM

## 2012-12-25 LAB — POCT URINALYSIS DIP (DEVICE)
Protein, ur: NEGATIVE mg/dL
Specific Gravity, Urine: 1.025 (ref 1.005–1.030)
Urobilinogen, UA: 1 mg/dL (ref 0.0–1.0)
pH: 6.5 (ref 5.0–8.0)

## 2012-12-25 MED ORDER — INFLUENZA VIRUS VACC SPLIT PF IM SUSP
0.5000 mL | Freq: Once | INTRAMUSCULAR | Status: AC
  Administered 2012-12-25: 0.5 mL via INTRAMUSCULAR

## 2012-12-25 NOTE — Progress Notes (Signed)
Pulse 79 Edema trace in feet. C/o increasing pain on RLQ of abdomen. No pressure. C/o vaginal d/c as yellow with itch.

## 2012-12-25 NOTE — Progress Notes (Signed)
S: feeling well, some abdominal pain and nausea O: see flow sheet Abd soft, NT External: no lesion Vagina: pink +rugae Cervix: pink, parous smooth Uterus: AGA A/P: [redacted]w[redacted]d New OB labs today Routine care Will try to schedule FIRST screen, patient aware we may not be able to get it scheduled on time Continue PNVs Danger signs reviewed.

## 2012-12-27 ENCOUNTER — Ambulatory Visit (HOSPITAL_COMMUNITY): Payer: Self-pay

## 2012-12-27 ENCOUNTER — Ambulatory Visit (HOSPITAL_COMMUNITY): Admission: RE | Admit: 2012-12-27 | Payer: Self-pay | Source: Ambulatory Visit

## 2012-12-27 LAB — OBSTETRIC PANEL
Antibody Screen: NEGATIVE
Basophils Absolute: 0 10*3/uL (ref 0.0–0.1)
Basophils Relative: 0 % (ref 0–1)
Eosinophils Absolute: 0.1 10*3/uL (ref 0.0–0.7)
Eosinophils Relative: 1 % (ref 0–5)
HCT: 34.7 % — ABNORMAL LOW (ref 36.0–46.0)
MCH: 28.8 pg (ref 26.0–34.0)
MCHC: 34 g/dL (ref 30.0–36.0)
MCV: 84.6 fL (ref 78.0–100.0)
Monocytes Absolute: 0.3 10*3/uL (ref 0.1–1.0)
RDW: 14.9 % (ref 11.5–15.5)
Rubella: 11.2 Index — ABNORMAL HIGH (ref <0.90)

## 2012-12-27 LAB — CULTURE, OB URINE: Organism ID, Bacteria: NO GROWTH

## 2012-12-31 ENCOUNTER — Encounter: Payer: Self-pay | Admitting: *Deleted

## 2013-01-03 ENCOUNTER — Encounter (HOSPITAL_COMMUNITY): Payer: Self-pay

## 2013-01-03 ENCOUNTER — Ambulatory Visit (HOSPITAL_COMMUNITY)
Admission: RE | Admit: 2013-01-03 | Discharge: 2013-01-03 | Disposition: A | Discharge status: Home / Self Care | Payer: Self-pay | Source: Ambulatory Visit | Attending: Advanced Practice Midwife | Admitting: Advanced Practice Midwife

## 2013-01-03 ENCOUNTER — Other Ambulatory Visit: Payer: Self-pay

## 2013-01-03 VITALS — BP 98/59 | HR 80 | Wt 188.5 lb

## 2013-01-03 DIAGNOSIS — Z3689 Encounter for other specified antenatal screening: Secondary | ICD-10-CM | POA: Insufficient documentation

## 2013-01-03 DIAGNOSIS — O3510X0 Maternal care for (suspected) chromosomal abnormality in fetus, unspecified, not applicable or unspecified: Secondary | ICD-10-CM | POA: Insufficient documentation

## 2013-01-03 NOTE — Progress Notes (Signed)
[facility] ultrasound  Indication: 27 yr old G3P2002 at [redacted]w[redacted]d for first trimester screen.  Findings: 1. Single intrauterine pregnancy. 2. Fetal crown rump length is consistent with dating. 3. Normal uterus; no adnexal masses seen. 4. Evaluation of fetal anatomy is limited by early gestational age. 5. Normal nuchal translucency measuring 1.4mm. 6. The nasal is visualized.  Recommendations: 1. First trimester screen drawn today; discussed limitations of screening tests in detecting fetal aneuploidy. 2. Recommend maternal serum AFP at 15-[redacted] weeks gestation. 3. Recommend fetal anatomic survey at 18-[redacted] weeks gestation.  Eulis Foster, MD

## 2013-01-10 ENCOUNTER — Encounter: Payer: Self-pay | Admitting: *Deleted

## 2013-01-22 ENCOUNTER — Ambulatory Visit (INDEPENDENT_AMBULATORY_CARE_PROVIDER_SITE_OTHER): Payer: Self-pay | Admitting: Advanced Practice Midwife

## 2013-01-22 ENCOUNTER — Encounter: Payer: Self-pay | Admitting: Advanced Practice Midwife

## 2013-01-22 VITALS — BP 101/65 | Temp 97.5°F

## 2013-01-22 DIAGNOSIS — O26892 Other specified pregnancy related conditions, second trimester: Secondary | ICD-10-CM

## 2013-01-22 MED ORDER — BUTALBITAL-APAP-CAFFEINE 50-325-40 MG PO TABS: 1.0000 | ORAL_TABLET | Freq: Four times a day (QID) | ORAL | Status: DC | PRN

## 2013-01-22 NOTE — Patient Instructions (Addendum)
AFP Maternal This is a routine screen (tests) used to check for fetal abnormalities such as Down syndrome and neural tube defects. Down Syndrome is a chromosomal abnormality, sometimes called Trisomy 64. Neural tube defects are serious birth defects. The brain, spinal cord, or their coverings do not develop completely. Women should be tested in the 15th to 20th week of pregnancy. The msAFP screen involves three or four tests that measure substances found in the blood that make the testing better. During development, AFP levels in fetal blood and amniotic fluid rise until about 12 weeks. The levels then gradually fall until birth. AFP is a protein produce by fetal tissue. AFP crosses the placenta and appears in the maternal blood. A baby with an open neural tube defect has an opening in its spine, head, or abdominal wall that allows higher-than-usual amounts of AFP to pass into the mother's blood. If a screen is positive, more tests are needed to make a diagnosis. These include ultrasound and perhaps amniocentesis (checking the fluid that surrounds the baby). These tests are used to help women and their caregivers make decisions about the management of their pregnancies. In pregnancies where the fetus is carrying the chromosomal defect that results in Down syndrome, the levels of AFP and unconjugated estriol tend to be low and hCG and inhibin A levels high.  PREPARATION FOR TEST Blood is drawn from a vein in your arm usually between the 15th and 20th weeks of pregnancy. Four different tests on your blood are done. These are AFP, hCG, unconjugated estriol, and inhibin A. The combination of tests produces a more accurate result. NORMAL FINDINGS   Adult: less than 40ng/mL or less than 40 mg/L (SI units)  Child younger than1 year: less than 30 ng/mL Ranges are stratified by weeks of gestation and vary among laboratories. Ranges for normal findings may vary among different laboratories and hospitals. You  should always check with your doctor after having lab work or other tests done to discuss the meaning of your test results and whether your values are considered within normal limits. MEANING OF TEST  These are screening tests. Not all fetal abnormalities will give positive test results. Of all women who have positive AFP screening results, only a very small number of them have babies who actually have a neural tube defect or chromosomal abnormality. Your caregiver will go over the test results with you and discuss the importance and meaning of your results, as well as treatment options and the need for additional tests if necessary. OBTAINING THE TEST RESULTS It is your responsibility to obtain your test results. Ask the lab or department performing the test when and how you will get your results. Document Released: 11/21/2004 Document Revised: 01/22/2012 Document Reviewed: 10/03/2008 [facility] Patient Information 2013 [place], [place]. Round Ligament Pain The round ligament is made up of muscle and fibrous tissue. It is attached to the uterus near the fallopian tube. The round ligament is located on both sides of the uterus and helps support the position of the uterus. It usually begins in the second trimester of pregnancy when the uterus comes out of the pelvis. The pain can come and go until the baby is delivered. Round ligament pain is not a serious problem and does not cause harm to the baby. CAUSE During pregnancy the uterus grows the most from the second trimester to delivery. As it grows, it stretches and slightly twists the round ligaments. When the uterus leans from one side to the other, the  round ligament on the opposite side pulls and stretches. This can cause pain. SYMPTOMS  Pain can occur on one side or both sides. The pain is usually a short, sharp, and pinching-like. Sometimes it can be a dull, lingering and aching pain. The pain is located in the lower side of the abdomen or in the  groin. The pain is internal and usually starts deep in the groin and moves up to the outside of the hip area. Pain can occur with:  Sudden change in position like getting out of bed or a chair.  Rolling over in bed.  Coughing or sneezing.  Walking too much.  Any type of physical activity. DIAGNOSIS  Your caregiver will make sure there are no serious problems causing the pain. When nothing serious is found, the symptoms usually indicate that the pain is from the round ligament. TREATMENT   Sit down and relax when the pain starts.  Flex your knees up to your belly.  Lay on your side with a pillow under your belly (abdomen) and another one between your legs.  Sit in a hot bath for 15 to 20 minutes or until the pain goes away. HOME CARE INSTRUCTIONS   Only take over-the-counter or prescriptions medicines for pain, discomfort or fever as directed by your caregiver.  Sit and stand slowly.  Avoid long walks if it causes pain.  Stop or lessen your physical activities if it causes pain. SEEK MEDICAL CARE IF:   The pain does not go away with any of your treatment.  You need stronger medication for the pain.  You develop back pain that you did not have before with the side pain. SEEK IMMEDIATE MEDICAL CARE IF:   You develop a temperature of 102 F (38.9 C) or higher.  You develop uterine contractions.  You develop vaginal bleeding.  You develop nausea, vomiting or diarrhea.  You develop chills.  You have pain when you urinate. Document Released: 08/08/2008 Document Revised: 01/22/2012 Document Reviewed: 08/08/2008 [facility] Patient Information 2013 [place], [place].

## 2013-01-22 NOTE — Progress Notes (Signed)
Pulse 80  Edema trace in hands. C/o of pelvic pain and persistent h/a X3days; pressure in vaginal area. C/o SOB at night.

## 2013-01-23 ENCOUNTER — Encounter: Payer: Self-pay | Admitting: Advanced Practice Midwife

## 2013-01-23 ENCOUNTER — Other Ambulatory Visit: Payer: Self-pay | Admitting: Advanced Practice Midwife

## 2013-01-23 LAB — POCT URINALYSIS DIP (DEVICE)
Glucose, UA: NEGATIVE mg/dL
Nitrite: NEGATIVE
Protein, ur: NEGATIVE mg/dL
Specific Gravity, Urine: >=1.03 (ref 1.005–1.030)
Urobilinogen, UA: 0.2 mg/dL (ref 0.0–1.0)

## 2013-01-23 NOTE — Progress Notes (Signed)
Edema not evident. Will try Rx of Fioricet for headache. Does not have chronic headaches. Had  Not tried any med for this headache at all. AFP done today .

## 2013-01-27 ENCOUNTER — Inpatient Hospital Stay (HOSPITAL_COMMUNITY)
Admission: AD | Admit: 2013-01-27 | Discharge: 2013-01-27 | Disposition: A | Discharge status: Home / Self Care | Payer: Self-pay | Source: Ambulatory Visit | Attending: Obstetrics & Gynecology | Admitting: Obstetrics & Gynecology

## 2013-01-27 ENCOUNTER — Encounter (HOSPITAL_COMMUNITY): Payer: Self-pay | Admitting: *Deleted

## 2013-01-27 DIAGNOSIS — R51 Headache: Secondary | ICD-10-CM | POA: Insufficient documentation

## 2013-01-27 DIAGNOSIS — R109 Unspecified abdominal pain: Secondary | ICD-10-CM | POA: Insufficient documentation

## 2013-01-27 DIAGNOSIS — N390 Urinary tract infection, site not specified: Secondary | ICD-10-CM | POA: Insufficient documentation

## 2013-01-27 DIAGNOSIS — O2390 Unspecified genitourinary tract infection in pregnancy, unspecified trimester: Secondary | ICD-10-CM | POA: Insufficient documentation

## 2013-01-27 LAB — CBC
Hemoglobin: 10.2 g/dL — ABNORMAL LOW (ref 12.0–15.0)
Platelets: 152 10*3/uL (ref 150–400)
RBC: 3.48 MIL/uL — ABNORMAL LOW (ref 3.87–5.11)
WBC: 6.8 10*3/uL (ref 4.0–10.5)

## 2013-01-27 LAB — URINALYSIS, ROUTINE W REFLEX MICROSCOPIC
Glucose, UA: NEGATIVE mg/dL
Hgb urine dipstick: NEGATIVE
Ketones, ur: NEGATIVE mg/dL
Leukocytes, UA: NEGATIVE
Protein, ur: NEGATIVE mg/dL
Urobilinogen, UA: 0.2 mg/dL (ref 0.0–1.0)

## 2013-01-27 LAB — WET PREP, GENITAL
Clue Cells Wet Prep HPF POC: NONE SEEN
Trich, Wet Prep: NONE SEEN

## 2013-01-27 LAB — URINE MICROSCOPIC-ADD ON

## 2013-01-27 MED ORDER — NITROFURANTOIN MONOHYD MACRO 100 MG PO CAPS: 100.0000 mg | ORAL_CAPSULE | Freq: Two times a day (BID) | ORAL | Status: DC
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
## 2013-01-27 NOTE — MAU Note (Signed)
Name and DOB verified. Pt confirms spelling is correct on armband 
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
## 2013-02-05 ENCOUNTER — Encounter: Payer: Self-pay | Admitting: *Deleted

## 2013-02-06 ENCOUNTER — Encounter (HOSPITAL_COMMUNITY): Payer: Self-pay | Admitting: *Deleted

## 2013-02-06 ENCOUNTER — Inpatient Hospital Stay (HOSPITAL_COMMUNITY)
Admission: AD | Admit: 2013-02-06 | Discharge: 2013-02-06 | Disposition: A | Discharge status: Home / Self Care | Payer: Self-pay | Source: Ambulatory Visit | Attending: Obstetrics and Gynecology | Admitting: Obstetrics and Gynecology

## 2013-02-06 DIAGNOSIS — O2390 Unspecified genitourinary tract infection in pregnancy, unspecified trimester: Secondary | ICD-10-CM | POA: Insufficient documentation

## 2013-02-06 DIAGNOSIS — N390 Urinary tract infection, site not specified: Secondary | ICD-10-CM | POA: Insufficient documentation

## 2013-02-06 DIAGNOSIS — R21 Rash and other nonspecific skin eruption: Secondary | ICD-10-CM | POA: Insufficient documentation

## 2013-02-06 DIAGNOSIS — R109 Unspecified abdominal pain: Secondary | ICD-10-CM | POA: Insufficient documentation

## 2013-02-06 DIAGNOSIS — O2342 Unspecified infection of urinary tract in pregnancy, second trimester: Secondary | ICD-10-CM

## 2013-02-06 DIAGNOSIS — O99891 Other specified diseases and conditions complicating pregnancy: Secondary | ICD-10-CM | POA: Insufficient documentation

## 2013-02-06 DIAGNOSIS — O26892 Other specified pregnancy related conditions, second trimester: Secondary | ICD-10-CM

## 2013-02-06 HISTORY — DX: Headache: R51

## 2013-02-06 HISTORY — DX: Urinary tract infection, site not specified: N39.0

## 2013-02-06 LAB — URINE MICROSCOPIC-ADD ON

## 2013-02-06 LAB — URINALYSIS, ROUTINE W REFLEX MICROSCOPIC
Bilirubin Urine: NEGATIVE
Glucose, UA: NEGATIVE mg/dL
Ketones, ur: NEGATIVE mg/dL
Nitrite: NEGATIVE
Protein, ur: NEGATIVE mg/dL
Specific Gravity, Urine: 1.02 (ref 1.005–1.030)
Urobilinogen, UA: 0.2 mg/dL (ref 0.0–1.0)
pH: 7 (ref 5.0–8.0)

## 2013-02-06 LAB — WET PREP, GENITAL
Clue Cells Wet Prep HPF POC: NONE SEEN
Trich, Wet Prep: NONE SEEN
Yeast Wet Prep HPF POC: NONE SEEN

## 2013-02-06 MED ORDER — CEPHALEXIN 500 MG PO CAPS: 500.0000 mg | ORAL_CAPSULE | Freq: Three times a day (TID) | ORAL | Status: DC

## 2013-02-06 NOTE — MAU Note (Signed)
Denies new soap, detergents or toiletries.  No animals at home, no one in family with rash.

## 2013-02-06 NOTE — MAU Provider Note (Signed)
History     CSN: [id number]  Arrival date and time: 02/06/13 4098   First Provider Initiated Contact with Patient 02/06/13 [phone]      Chief Complaint  Patient presents with  . Rash  . Abdominal Pain   HPI Debra Allen is a very pleasant  27 y.o. @ [redacted]w[redacted]d gestation who presents to MAU with a rash. The rash started 5 days ago. She describes the rash as starting as little bumps that get larger with redness around them and then go away for a while and come back. The rash itches. The rash is located on the arms, legs and trunk. Getting hot or a hot bath makes the rash worse. Nothing makes the rash better. She has used aloe lotion. She has also had some discomfort in her lower abdomen that comes and goes the past few days and increased vaginal discharge. Last sexual intercourse 2 weeks ago. The history was provided by the patient without a Nurse, learning disability. She speaks English very well.   OB History   Grav Para Term Preterm Abortions TAB SAB Ect Mult Living   3 2 2       2       Past Medical History  Diagnosis Date  . Headache   . Urinary tract infection     Past Surgical History  Procedure Laterality Date  . No past surgeries      Family History  Problem Relation Age of Onset  . Other Neg Hx   . Diabetes Mother   . Hypertension Maternal Grandmother   . Diabetes Maternal Grandmother   . Cancer Paternal Grandmother   . Cancer Paternal Grandfather     History  Substance Use Topics  . Smoking status: Never Smoker   . Smokeless tobacco: Never Used  . Alcohol Use: No    Allergies: No Known Allergies  Prescriptions prior to admission  Medication Sig Dispense Refill  . Prenatal Vit-Fe Fumarate-FA (PRENATAL MULTIVITAMIN) TABS Take 1 tablet by mouth daily.        Review of Systems  Constitutional: Negative for fever, chills and weight loss.  HENT: Negative for ear pain, congestion, sore throat and neck pain.   Eyes: Negative for blurred vision.  Respiratory: Negative  for cough, shortness of breath and wheezing.   Cardiovascular: Negative for chest pain, palpitations and leg swelling.  Gastrointestinal: Positive for abdominal pain. Negative for nausea, vomiting, diarrhea and constipation.  Genitourinary: Positive for frequency. Negative for dysuria and urgency.       Vaginal discharge  Musculoskeletal: Negative for myalgias and back pain.  Skin: Positive for itching and rash.  Neurological: Negative for dizziness, speech change, seizures, weakness and headaches.  Psychiatric/Behavioral: Negative for depression. The patient is not nervous/anxious.    Physical Exam   Blood pressure 97/56, pulse 75, temperature 97.4 F (36.3 C), temperature source Oral, resp. rate 18, height 5' 1.5" (1.562 m), weight 159 lb 9.6 oz (72.394 kg), last menstrual period 08/13/2012.  Physical Exam  Nursing note and vitals reviewed. Constitutional: She is oriented to person, place, and time. She appears well-developed and well-nourished. No distress.  HENT:  Head: Normocephalic and atraumatic.  Eyes: EOM are normal.  Neck: Neck supple.  Cardiovascular: Normal rate.   Respiratory: Effort normal.  GI: Soft. There is tenderness in the suprapubic area. There is no rigidity, no rebound, no guarding and no CVA tenderness.  Musculoskeletal: Normal range of motion.  Neurological: She is alert and oriented to person, place, and time.  Skin: Skin  is warm and dry. Rash noted.  There are small raised areas of redness forearms, thighs and trunk. Pressure areas with increased redness.   Psychiatric: She has a normal mood and affect. Her behavior is normal. Judgment and thought content normal.   Results for orders placed during the hospital encounter of 02/06/13 (from the past 24 hour(s))  URINALYSIS, ROUTINE W REFLEX MICROSCOPIC     Status: Abnormal   Collection Time    02/06/13  8:24 AM      Result Value Range   Color, Urine YELLOW  YELLOW   APPearance CLEAR  CLEAR   Specific  Gravity, Urine 1.020  1.005 - 1.030   pH 7.0  5.0 - 8.0   Glucose, UA NEGATIVE  NEGATIVE mg/dL   Hgb urine dipstick TRACE (*) NEGATIVE   Bilirubin Urine NEGATIVE  NEGATIVE   Ketones, ur NEGATIVE  NEGATIVE mg/dL   Protein, ur NEGATIVE  NEGATIVE mg/dL   Urobilinogen, UA 0.2  0.0 - 1.0 mg/dL   Nitrite NEGATIVE  NEGATIVE   Leukocytes, UA MODERATE (*) NEGATIVE  URINE MICROSCOPIC-ADD ON     Status: Abnormal   Collection Time    02/06/13  8:24 AM      Result Value Range   Squamous Epithelial / LPF FEW (*) RARE   WBC, UA 7-10  <3 WBC/hpf   RBC / HPF 0-2  <3 RBC/hpf   Bacteria, UA FEW (*) RARE  WET PREP, GENITAL     Status: Abnormal   Collection Time    02/06/13  9:01 AM      Result Value Range   Yeast Wet Prep HPF POC NONE SEEN  NONE SEEN   Trich, Wet Prep NONE SEEN  NONE SEEN   Clue Cells Wet Prep HPF POC NONE SEEN  NONE SEEN   WBC, Wet Prep HPF POC MANY (*) NONE SEEN    Assessment: 27 y.o. female @ [redacted]w[redacted]d gestation with a rash, probably allergic reaction   Abdominal pain in pregnancy   UTI in pregnancy  Plan:  Benadryl for itching   Treat UTI   Follow up in the OB office here at [facility] MAU Course  Procedures  MDM Discussed with the patient and all questioned fully answered. She will return if any problems arise.  I discussed in detail with the patient possible causes of allergic dermitis. She will be aware of any thing new she may have done or exposed herself to that may have caused the rash.  Discussed UTI in pregnancy and need for antibiotic therapy. Culture pending.    Medication List    STOP taking these medications       prenatal multivitamin Tabs      TAKE these medications       cephALEXin 500 MG capsule  Commonly known as:  KEFLEX  Take 1 capsule (500 mg total) by mouth 3 (three) times daily.        NEESE,HOPE, RN, FNP, [facility] 02/06/2013, 9:15 AM

## 2013-02-06 NOTE — MAU Note (Signed)
Name and DOB verified. Pt confirms correct spellling on name.

## 2013-02-06 NOTE — MAU Note (Signed)
Patient states she has had an itchy rash all over her body since this past Saturday. Also having lower abdominal pressure. Denies bleeding but does have a slight discharge.

## 2013-02-06 NOTE — MAU Note (Signed)
Small raised bumps all over, first noted Fri/Sat- thought it was allergic reaction to medication (fiorcet).  Has stopped med, but bumps still present. No one else at home has symptoms.  Pain in lower abd ( like too many sit ups) noted yesterday when standing and walking. Was feeling movement. Now not.

## 2013-02-07 LAB — URINE CULTURE

## 2013-02-07 LAB — GC/CHLAMYDIA PROBE AMP: GC Probe RNA: NEGATIVE

## 2013-02-07 NOTE — MAU Provider Note (Signed)
Attestation of Attending Supervision of Advanced Practitioner (CNM/NP): Evaluation and management procedures were performed by the Advanced Practitioner under my supervision and collaboration.  I have reviewed the Advanced Practitioner's note and chart, and I agree with the management and plan.  Akeia Perot 02/07/2013 8:07 AM

## 2013-02-11 NOTE — MAU Provider Note (Signed)
I have seen and examined this patient and agree the above assessment. CRESENZO-DISHMAN,Anthonymichael Munday 02/11/2013 10:43 AM

## 2013-02-12 ENCOUNTER — Encounter: Payer: Self-pay | Admitting: Advanced Practice Midwife

## 2013-02-12 ENCOUNTER — Ambulatory Visit (HOSPITAL_COMMUNITY)
Admission: RE | Admit: 2013-02-12 | Discharge: 2013-02-12 | Disposition: A | Discharge status: Home / Self Care | Payer: Self-pay | Source: Ambulatory Visit | Attending: Advanced Practice Midwife | Admitting: Advanced Practice Midwife

## 2013-02-12 DIAGNOSIS — O26892 Other specified pregnancy related conditions, second trimester: Secondary | ICD-10-CM

## 2013-02-12 DIAGNOSIS — Z363 Encounter for antenatal screening for malformations: Secondary | ICD-10-CM | POA: Insufficient documentation

## 2013-02-12 DIAGNOSIS — Z1389 Encounter for screening for other disorder: Secondary | ICD-10-CM | POA: Insufficient documentation

## 2013-02-12 DIAGNOSIS — O358XX0 Maternal care for other (suspected) fetal abnormality and damage, not applicable or unspecified: Secondary | ICD-10-CM | POA: Insufficient documentation

## 2013-02-12 DIAGNOSIS — Z3490 Encounter for supervision of normal pregnancy, unspecified, unspecified trimester: Secondary | ICD-10-CM | POA: Insufficient documentation

## 2013-02-19 ENCOUNTER — Ambulatory Visit (INDEPENDENT_AMBULATORY_CARE_PROVIDER_SITE_OTHER): Payer: Self-pay | Admitting: Family Medicine

## 2013-02-19 VITALS — BP 99/67 | Temp 97.0°F | Wt 162.7 lb

## 2013-02-19 DIAGNOSIS — O358XX0 Maternal care for other (suspected) fetal abnormality and damage, not applicable or unspecified: Secondary | ICD-10-CM

## 2013-02-19 DIAGNOSIS — R21 Rash and other nonspecific skin eruption: Secondary | ICD-10-CM | POA: Insufficient documentation

## 2013-02-19 DIAGNOSIS — Z3490 Encounter for supervision of normal pregnancy, unspecified, unspecified trimester: Secondary | ICD-10-CM

## 2013-02-19 LAB — POCT URINALYSIS DIP (DEVICE)
Hgb urine dipstick: NEGATIVE
Nitrite: NEGATIVE
Protein, ur: NEGATIVE mg/dL
Specific Gravity, Urine: >=1.03 (ref 1.005–1.030)
Urobilinogen, UA: 0.2 mg/dL (ref 0.0–1.0)

## 2013-02-19 MED ORDER — HYDROXYZINE HCL 25 MG PO TABS: 25.0000 mg | ORAL_TABLET | Freq: Four times a day (QID) | ORAL | Status: DC | PRN

## 2013-02-19 MED ORDER — TRIAMCINOLONE ACETONIDE 0.1 % EX CREA: TOPICAL_CREAM | Freq: Two times a day (BID) | CUTANEOUS | Status: DC

## 2013-02-19 MED ORDER — FLUCONAZOLE 150 MG PO TABS: 150.0000 mg | ORAL_TABLET | Freq: Once | ORAL | Status: DC

## 2013-02-19 NOTE — Progress Notes (Signed)
Pulse-82 Patient reports lower abdominal pain & navel pain; pain reports when she took Keflex she experienced a rash & itching; reports yellow to white d/c with a lot of itching

## 2013-02-19 NOTE — Progress Notes (Signed)
Itching all over. Has excoriations on legs, arms, trunk. Some lesions look urticarial, others red and papular. Not on palms and soles. No plaques, not prominent within striae. Likely atopic reaction. Benadryl and hydrocortisone not working. Trial of hydroxyzine, triamcinolone - discussed possible allergic reaction to medication, food, soap/detergent. Consider checking bile acids next visit if still having problems.

## 2013-02-19 NOTE — Patient Instructions (Addendum)
Eczema Atopic dermatitis, or eczema, is an inherited type of sensitive skin. Often people with eczema have a family history of allergies, asthma, or hay fever. It causes a red itchy rash and dry scaly skin. The itchiness may occur before the skin rash and may be very intense. It is not contagious. Eczema is generally worse during the cooler winter months and often improves with the warmth of summer. Eczema usually starts showing signs in infancy. Some children outgrow eczema, but it may last through adulthood. Flare-ups may be caused by:  Eating something or contact with something you are sensitive or allergic to.  Stress. DIAGNOSIS  The diagnosis of eczema is usually based upon symptoms and medical history. TREATMENT  Eczema cannot be cured, but symptoms usually can be controlled with treatment or avoidance of allergens (things to which you are sensitive or allergic to).  Controlling the itching and scratching.  Use over-the-counter antihistamines as directed for itching. It is especially useful at night when the itching tends to be worse.  Use over-the-counter steroid creams as directed for itching.  Scratching makes the rash and itching worse and may cause impetigo (a skin infection) if fingernails are contaminated (dirty).  Keeping the skin well moisturized with creams every day. This will seal in moisture and help prevent dryness. Lotions containing alcohol and water can dry the skin and are not recommended.  Limiting exposure to allergens.  Recognizing situations that cause stress.  Developing a plan to manage stress. HOME CARE INSTRUCTIONS   Take prescription and over-the-counter medicines as directed by your caregiver.  Do not use anything on the skin without checking with your caregiver.  Keep baths or showers short (5 minutes) in warm (not hot) water. Use mild cleansers for bathing. You may add non-perfumed bath oil to the bath water. It is best to avoid soap and bubble  bath.  Immediately after a bath or shower, when the skin is still damp, apply a moisturizing ointment to the entire body. This ointment should be a petroleum ointment. This will seal in moisture and help prevent dryness. The thicker the ointment the better. These should be unscented.  Keep fingernails cut short and wash hands often. If your child has eczema, it may be necessary to put soft gloves or mittens on your child at night.  Dress in clothes made of cotton or cotton blends. Dress lightly, as heat increases itching.  Avoid foods that may cause flare-ups. Common foods include cow's milk, peanut butter, eggs and wheat.  Keep a child with eczema away from anyone with fever blisters. The virus that causes fever blisters (herpes simplex) can cause a serious skin infection in children with eczema. SEEK MEDICAL CARE IF:   Itching interferes with sleep.  The rash gets worse or is not better within one week following treatment.  The rash looks infected (pus or soft yellow scabs).  You or your child has an oral temperature above 102 F (38.9 C).  Your baby is older than 3 months with a rectal temperature of 100.5 F (38.1 C) or higher for more than 1 day.  The rash flares up after contact with someone who has fever blisters. SEEK IMMEDIATE MEDICAL CARE IF:   Your baby is older than 3 months with a rectal temperature of 102 F (38.9 C) or higher.  Your baby is older than 3 months or younger with a rectal temperature of 100.4 F (38 C) or higher. Document Released: 10/27/2000 Document Revised: 01/22/2012 Document Reviewed: 09/01/2009 [employer]Care   Patient Information 2013 [place], [place].  Pruritic Urticarial Papules and Plaques of Pregnancy When you are pregnant, your body changes in many ways. That includes the skin. Rashes sometimes develop. The most common skin rash during pregnancy is called pruritic urticarial papules and plaques of pregnancy (PUPPP). The small red bumps sometimes  form large plaques. These are very itchy. The rash usually appears in the last few weeks of pregnancy during the third trimester. Sometimes, it can occur shortly after giving birth. It goes away within 1 to 2 months after delivery. It is not harmful. You may not like the way it looks or feels. However, there is no reason to believe that it does any harm to your baby. It will not leave scars on your skin. CAUSES  Exactly why this skin rash develops is not known.  Your skin stretches rapidly when you are pregnant. This may cause PUPPP. Stretching happens most over the belly. Skin also stretches in other areas, including the legs, hips, and breasts.  Some women are more likely than others to develop PUPPP. They include:  Those who are having a baby for the first time.  Women who gain too much weight.  Those who are carrying more than 1 baby. SYMPTOMS   Appearance of a red, raised rash.  It might look like hives or an allergic reaction.  Sometimes, there are tiny blisters in the center of the patches.  The skin around the rash is often pale.  The rash is usually on the belly. It can spread to other parts of the body, such as the thighs or arms.  Severe itching. DIAGNOSIS  To decide if you have PUPPP, your caregiver may:  Ask about symptoms you have noticed.  Ask about your overall health, today and in the past.  Ask about your pregnancy.  Ask whether you have been near anything that could cause an allergic reaction.  Examine the skin on parts of the body that concern you.  Order a biopsy. A small sample of skin tissue is removed and looked at under a microscope.  Order blood tests. These may rule out other reasons for a rash. TREATMENT  The goal is to stop the itching and keep the rash from spreading. Usually, a cream is used to do this. However, treatment varies. Common options include:  Topical corticosteroids. These are powerful drugs that can help stop  itching.  Usually, the drug is used as a cream or ointment. It is put directly on the rash.  Sometimes, a stronger oral corticosteroid is needed. This is taken as a pill.  Antihistamines. These drugs are sometimes given for PUPPP. They are oral medicines. They are normally used for allergies, but they may help with the intense itching that can be seen in PUPPP. Treatment helps nearly all women with PUPPP. The creams or pills should make your skin feel better fairly quickly. The rash and itching should be gone a few months after your baby is born. HOME CARE INSTRUCTIONS   Do not scratch the rash.  Wear loose clothing.  Apply any creams that your caregiver prescribed. Follow the directions carefully.  Keep all follow-up appointments with your caregiver. This will allow your caregiver to make sure your treatment is working.  Learn more about skin care. You might want to meet with a skin doctor (dermatologist). Ask about a moisturizer that would help your skin while it stretches during pregnancy. Also, ask about choosing skin products that are right for you. SEEK MEDICAL CARE  IF:   You have any questions about your medicines.  The itching continues or the rash continues to spread.  You continue to be uncomfortable or unable to sleep. Your caregiver may want to change your medicine. Document Released: 01/24/2010 Document Revised: 01/22/2012 Document Reviewed: 01/24/2010 [facility] Patient Information 2013 [place], [place].

## 2013-03-19 ENCOUNTER — Ambulatory Visit (INDEPENDENT_AMBULATORY_CARE_PROVIDER_SITE_OTHER): Payer: Self-pay | Admitting: Advanced Practice Midwife

## 2013-03-19 ENCOUNTER — Encounter: Payer: Self-pay | Admitting: Advanced Practice Midwife

## 2013-03-19 VITALS — BP 104/65 | Temp 97.2°F | Wt 165.8 lb

## 2013-03-19 DIAGNOSIS — Z1389 Encounter for screening for other disorder: Secondary | ICD-10-CM

## 2013-03-19 DIAGNOSIS — Z3490 Encounter for supervision of normal pregnancy, unspecified, unspecified trimester: Secondary | ICD-10-CM

## 2013-03-19 DIAGNOSIS — Z363 Encounter for antenatal screening for malformations: Secondary | ICD-10-CM

## 2013-03-19 LAB — POCT URINALYSIS DIP (DEVICE)
Hgb urine dipstick: NEGATIVE
Nitrite: NEGATIVE
Urobilinogen, UA: 1 mg/dL (ref 0.0–1.0)
pH: 6.5 (ref 5.0–8.0)

## 2013-03-19 NOTE — Progress Notes (Signed)
Pulse 70 

## 2013-03-19 NOTE — Progress Notes (Signed)
C/o shortness of breath/nasal congestion - no real respiratory distress, consistent with normal pregnancy changes. Rev'd comfort measures, warning signs, saline nasal spray for nasal inflammation. Also c/o occasional headaches - rec increase hydration, tylenol as needed. 1 hour GCT at next visit.

## 2013-03-19 NOTE — Patient Instructions (Signed)
Pregnancy - Second Trimester The second trimester of pregnancy (3 to 6 months) is a period of rapid growth for you and your baby. At the end of the sixth month, your baby is about 9 inches long and weighs 1 1/2 pounds. You will begin to feel the baby move between 18 and 20 weeks of the pregnancy. This is called quickening. Weight gain is faster. A clear fluid (colostrum) may leak out of your breasts. You may feel small contractions of the womb (uterus). This is known as false labor or Braxton-Hicks contractions. This is like a practice for labor when the baby is ready to be born. Usually, the problems with morning sickness have usually passed by the end of your first trimester. Some women develop small dark blotches (called cholasma, mask of pregnancy) on their face that usually goes away after the baby is born. Exposure to the sun makes the blotches worse. Acne may also develop in some pregnant women and pregnant women who have acne, may find that it goes away. PRENATAL EXAMS  Blood work may continue to be done during prenatal exams. These tests are done to check on your health and the probable health of your baby. Blood work is used to follow your blood levels (hemoglobin). Anemia (low hemoglobin) is common during pregnancy. Iron and vitamins are given to help prevent this. You will also be checked for diabetes between 24 and 28 weeks of the pregnancy. Some of the previous blood tests may be repeated.  The size of the uterus is measured during each visit. This is to make sure that the baby is continuing to grow properly according to the dates of the pregnancy.  Your blood pressure is checked every prenatal visit. This is to make sure you are not getting toxemia.  Your urine is checked to make sure you do not have an infection, diabetes or protein in the urine.  Your weight is checked often to make sure gains are happening at the suggested rate. This is to ensure that both you and your baby are growing  normally.  Sometimes, an ultrasound is performed to confirm the proper growth and development of the baby. This is a test which bounces harmless sound waves off the baby so your caregiver can more accurately determine due dates. Sometimes, a specialized test is done on the amniotic fluid surrounding the baby. This test is called an amniocentesis. The amniotic fluid is obtained by sticking a needle into the belly (abdomen). This is done to check the chromosomes in instances where there is a concern about possible genetic problems with the baby. It is also sometimes done near the end of pregnancy if an early delivery is required. In this case, it is done to help make sure the baby's lungs are mature enough for the baby to live outside of the womb. CHANGES OCCURING IN THE SECOND TRIMESTER OF PREGNANCY Your body goes through many changes during pregnancy. They vary from person to person. Talk to your caregiver about changes you notice that you are concerned about.  During the second trimester, you will likely have an increase in your appetite. It is normal to have cravings for certain foods. This varies from person to person and pregnancy to pregnancy.  Your lower abdomen will begin to bulge.  You may have to urinate more often because the uterus and baby are pressing on your bladder. It is also common to get more bladder infections during pregnancy (pain with urination). You can help this by   drinking lots of fluids and emptying your bladder before and after intercourse.  You may begin to get stretch marks on your hips, abdomen, and breasts. These are normal changes in the body during pregnancy. There are no exercises or medications to take that prevent this change.  You may begin to develop swollen and bulging veins (varicose veins) in your legs. Wearing support hose, elevating your feet for 15 minutes, 3 to 4 times a day and limiting salt in your diet helps lessen the problem.  Heartburn may develop  as the uterus grows and pushes up against the stomach. Antacids recommended by your caregiver helps with this problem. Also, eating smaller meals 4 to 5 times a day helps.  Constipation can be treated with a stool softener or adding bulk to your diet. Drinking lots of fluids, vegetables, fruits, and whole grains are helpful.  Exercising is also helpful. If you have been very active up until your pregnancy, most of these activities can be continued during your pregnancy. If you have been less active, it is helpful to start an exercise program such as walking.  Hemorrhoids (varicose veins in the rectum) may develop at the end of the second trimester. Warm sitz baths and hemorrhoid cream recommended by your caregiver helps hemorrhoid problems.  Backaches may develop during this time of your pregnancy. Avoid heavy lifting, wear low heal shoes and practice good posture to help with backache problems.  Some pregnant women develop tingling and numbness of their hand and fingers because of swelling and tightening of ligaments in the wrist (carpel tunnel syndrome). This goes away after the baby is born.  As your breasts enlarge, you may have to get a bigger bra. Get a comfortable, cotton, support bra. Do not get a nursing bra until the last month of the pregnancy if you will be nursing the baby.  You may get a dark line from your belly button to the pubic area called the linea nigra.  You may develop rosy cheeks because of increase blood flow to the face.  You may develop spider looking lines of the face, neck, arms and chest. These go away after the baby is born. HOME CARE INSTRUCTIONS   It is extremely important to avoid all smoking, herbs, alcohol, and unprescribed drugs during your pregnancy. These chemicals affect the formation and growth of the baby. Avoid these chemicals throughout the pregnancy to ensure the delivery of a healthy infant.  Most of your home care instructions are the same as  suggested for the first trimester of your pregnancy. Keep your caregiver's appointments. Follow your caregiver's instructions regarding medication use, exercise and diet.  During pregnancy, you are providing food for you and your baby. Continue to eat regular, well-balanced meals. Choose foods such as meat, fish, milk and other low fat dairy products, vegetables, fruits, and whole-grain breads and cereals. Your caregiver will tell you of the ideal weight gain.  A physical sexual relationship may be continued up until near the end of pregnancy if there are no other problems. Problems could include early (premature) leaking of amniotic fluid from the membranes, vaginal bleeding, abdominal pain, or other medical or pregnancy problems.  Exercise regularly if there are no restrictions. Check with your caregiver if you are unsure of the safety of some of your exercises. The greatest weight gain will occur in the last 2 trimesters of pregnancy. Exercise will help you:  Control your weight.  Get you in shape for labor and delivery.  Lose weight   after you have the baby.  Wear a good support or jogging bra for breast tenderness during pregnancy. This may help if worn during sleep. Pads or tissues may be used in the bra if you are leaking colostrum.  Do not use hot tubs, steam rooms or saunas throughout the pregnancy.  Wear your seat belt at all times when driving. This protects you and your baby if you are in an accident.  Avoid raw meat, uncooked cheese, cat litter boxes and soil used by cats. These carry germs that can cause birth defects in the baby.  The second trimester is also a good time to visit your dentist for your dental health if this has not been done yet. Getting your teeth cleaned is OK. Use a soft toothbrush. Brush gently during pregnancy.  It is easier to loose urine during pregnancy. Tightening up and strengthening the pelvic muscles will help with this problem. Practice stopping your  urination while you are going to the bathroom. These are the same muscles you need to strengthen. It is also the muscles you would use as if you were trying to stop from passing gas. You can practice tightening these muscles up 10 times a set and repeating this about 3 times per day. Once you know what muscles to tighten up, do not perform these exercises during urination. It is more likely to contribute to an infection by backing up the urine.  Ask for help if you have financial, counseling or nutritional needs during pregnancy. Your caregiver will be able to offer counseling for these needs as well as refer you for other special needs.  Your skin may become oily. If so, wash your face with mild soap, use non-greasy moisturizer and oil or cream based makeup. MEDICATIONS AND DRUG USE IN PREGNANCY  Take prenatal vitamins as directed. The vitamin should contain 1 milligram of folic acid. Keep all vitamins out of reach of children. Only a couple vitamins or tablets containing iron may be fatal to a baby or young child when ingested.  Avoid use of all medications, including herbs, over-the-counter medications, not prescribed or suggested by your caregiver. Only take over-the-counter or prescription medicines for pain, discomfort, or fever as directed by your caregiver. Do not use aspirin.  Let your caregiver also know about herbs you may be using.  Alcohol is related to a number of birth defects. This includes fetal alcohol syndrome. All alcohol, in any form, should be avoided completely. Smoking will cause low birth rate and premature babies.  Street or illegal drugs are very harmful to the baby. They are absolutely forbidden. A baby born to an addicted mother will be addicted at birth. The baby will go through the same withdrawal an adult does. SEEK MEDICAL CARE IF:  You have any concerns or worries during your pregnancy. It is better to call with your questions if you feel they cannot wait, rather  than worry about them. SEEK IMMEDIATE MEDICAL CARE IF:   An unexplained oral temperature above 102 F (38.9 C) develops, or as your caregiver suggests.  You have leaking of fluid from the vagina (birth canal). If leaking membranes are suspected, take your temperature and tell your caregiver of this when you call.  There is vaginal spotting, bleeding, or passing clots. Tell your caregiver of the amount and how many pads are used. Light spotting in pregnancy is common, especially following intercourse.  You develop a bad smelling vaginal discharge with a change in the color from clear   to white.  You continue to feel sick to your stomach (nauseated) and have no relief from remedies suggested. You vomit blood or coffee ground-like materials.  You lose more than 2 pounds of weight or gain more than 2 pounds of weight over 1 week, or as suggested by your caregiver.  You notice swelling of your face, hands, feet, or legs.  You get exposed to German measles and have never had them.  You are exposed to fifth disease or chickenpox.  You develop belly (abdominal) pain. Round ligament discomfort is a common non-cancerous (benign) cause of abdominal pain in pregnancy. Your caregiver still must evaluate you.  You develop a bad headache that does not go away.  You develop fever, diarrhea, pain with urination, or shortness of breath.  You develop visual problems, blurry, or double vision.  You fall or are in a car accident or any kind of trauma.  There is mental or physical violence at home. Document Released: 10/24/2001 Document Revised: 01/22/2012 Document Reviewed: 04/28/2009 Exit[employer] Patient Information 2013 [employer].  

## 2013-04-16 ENCOUNTER — Encounter: Payer: Self-pay | Admitting: Obstetrics and Gynecology

## 2013-04-16 ENCOUNTER — Ambulatory Visit (INDEPENDENT_AMBULATORY_CARE_PROVIDER_SITE_OTHER): Payer: Self-pay | Admitting: Obstetrics and Gynecology

## 2013-04-16 VITALS — BP 97/61 | Temp 97.4°F

## 2013-04-16 DIAGNOSIS — O358XX0 Maternal care for other (suspected) fetal abnormality and damage, not applicable or unspecified: Secondary | ICD-10-CM

## 2013-04-16 DIAGNOSIS — F5089 Other specified eating disorder: Secondary | ICD-10-CM | POA: Insufficient documentation

## 2013-04-16 LAB — POCT URINALYSIS DIP (DEVICE)
Nitrite: NEGATIVE
Protein, ur: NEGATIVE mg/dL
Urobilinogen, UA: 0.2 mg/dL (ref 0.0–1.0)
pH: 5.5 (ref 5.0–8.0)

## 2013-04-16 LAB — CBC
HCT: 31.3 % — ABNORMAL LOW (ref 36.0–46.0)
Hemoglobin: 10.5 g/dL — ABNORMAL LOW (ref 12.0–15.0)
MCH: 29.1 pg (ref 26.0–34.0)
MCV: 86.7 fL (ref 78.0–100.0)
RBC: 3.61 MIL/uL — ABNORMAL LOW (ref 3.87–5.11)

## 2013-04-16 MED ORDER — FLUCONAZOLE 150 MG PO TABS: ORAL_TABLET | ORAL | Status: DC

## 2013-04-16 MED ORDER — FLUCONAZOLE 150 MG PO TABS: 150.0000 mg | ORAL_TABLET | Freq: Once | ORAL | Status: DC

## 2013-04-16 NOTE — Progress Notes (Signed)
Craves eating dirt and not eating well> see Nutritionist and check CBC. Has [facility] appointment so will see Nutritionist there.  RLP discussed. 1 hr GCT, Cultures and WP done (has increased discharge and itch).

## 2013-04-16 NOTE — Addendum Note (Signed)
Addended by: Jill Side on: 04/16/2013 11:29 AM   Modules accepted: Orders

## 2013-04-16 NOTE — Progress Notes (Signed)
Pulse 82 Edema trace in hands and feet. C/o pain and pressure in pelvic area. C/o frequent dizzy spells. C/o yellow vaginal d/c with itch.

## 2013-04-16 NOTE — Patient Instructions (Addendum)
Dolor del ligamento redondo (Round Ligament Pain) El ligamento redondo se compone de msculo y tejido fibroso. Est unido al tero cerca de las trompas de Falopio El ligamento redondo est ubicado en ambos lados del tero y ayuda a mantener su posicin. Normalmente comienza en el segundo trimestre del embarazo cuando el tero sale hacia afuera de la pelvis. El dolor puede aparecer y desaparecer hasta el nacimiento del beb. El dolor de ligamento redondo no es un problema serio y no ocasiona daos al beb. CAUSAS Durante el embarazo el tero crece mayormente desde el segundo trimestre hasta el parto. A medida que crece, se estira y tuerce ligeramente los ligamentos. Cuando el tero ejerce presin en ambos lados, el ligamento redondo del lado opuesto presiona y se estira. Esto causa dolor. SNTOMAS El dolor puede ocurrir en uno o ambos lados. El dolor es por lo general como un pellizco corto y filoso. A veces puede ser un dolor opaco y persistente. Se siente en la parte baja del abdomen o en la ingle. Es un dolor interno, y por lo general comienza en la ingle y se mueve hacia la zona de la cadera. El dolor puede ocurrir con:  Cambio de posicin repentino como el levantarse de la cama o de una silla.  Darse vuelta en la cama.  Toser o estornudar.  Caminar demasiado.  Cualquier tipo de actividad fsica. DIAGNSTICO El medico deber asegurarse de que no existen problemas graves que ocasionan el dolor. Si no encuentra nada grave, los sntomas suelen indicar de que se trata de un dolor proveniente del ligamento redondo. TRATAMIENTO  Sintese y reljese cuando el dolor comience.  Llevar las rodillas hacia el estmago.  Recustese sobre un lado con una almohada debajo del vientre (abdomen) y otra entre sus piernas.  Sintese en un bao caliente de 15 a 20 minutos o hasta que el dolor desaparezca. INSTRUCCIONES PARA EL CUIDADO DOMICILIARIO  Utilice los medicamentos de venta libre o de  prescripcin para el dolor, el malestar o la fiebre, segn se lo indique el profesional que lo asiste.  Sintese y pngase de pie lentamente.  Evite caminatas largas si le ocasionan dolor.  Detenga o disminuya las actividades fsicas si le ocasionan dolor. SOLICITE ATENCIN MDICA SI:  El dolor no desaparece con estas medidas.  Necesita que le prescriban medicamentos ms fuertes para el dolor.  Desarrolla un dolor de espalda que no haba sentido antes junto con el de lado. SOLICITE ATENCIN MDICA DE INMEDIATO SI:  La temperatura se eleva por encima de 38,9 C (102 F) o ms.  Siente contracciones uterinas.  Presenta hemorragia vaginal.  Presenta nuseas, diarrea o vmitos.  Comienza a sentir escalofros  Siente dolor al orinar. Document Released: 10/12/2008 Document Revised: 01/22/2012 ExitCare Patient Information 2014 [employer].  

## 2013-04-17 LAB — RPR

## 2013-04-30 ENCOUNTER — Ambulatory Visit (INDEPENDENT_AMBULATORY_CARE_PROVIDER_SITE_OTHER): Payer: Self-pay | Admitting: Obstetrics and Gynecology

## 2013-04-30 ENCOUNTER — Encounter: Payer: Self-pay | Admitting: Obstetrics and Gynecology

## 2013-04-30 VITALS — BP 107/66 | Temp 98.0°F | Wt 171.8 lb

## 2013-04-30 DIAGNOSIS — Z1389 Encounter for screening for other disorder: Secondary | ICD-10-CM

## 2013-04-30 DIAGNOSIS — Z3490 Encounter for supervision of normal pregnancy, unspecified, unspecified trimester: Secondary | ICD-10-CM

## 2013-04-30 DIAGNOSIS — O358XX0 Maternal care for other (suspected) fetal abnormality and damage, not applicable or unspecified: Secondary | ICD-10-CM

## 2013-04-30 DIAGNOSIS — O359XX0 Maternal care for (suspected) fetal abnormality and damage, unspecified, not applicable or unspecified: Secondary | ICD-10-CM

## 2013-04-30 LAB — POCT URINALYSIS DIP (DEVICE)
Ketones, ur: NEGATIVE mg/dL
Protein, ur: NEGATIVE mg/dL
Specific Gravity, Urine: 1.02 (ref 1.005–1.030)
pH: 7 (ref 5.0–8.0)

## 2013-04-30 NOTE — Patient Instructions (Signed)
Dolor del ligamento redondo (Round Ligament Pain) El ligamento redondo se compone de msculo y tejido fibroso. Est unido al tero cerca de las trompas de Falopio El ligamento redondo est ubicado en ambos lados del tero y ayuda a mantener su posicin. Normalmente comienza en el segundo trimestre del embarazo cuando el tero sale hacia afuera de la pelvis. El dolor puede aparecer y desaparecer hasta el nacimiento del beb. El dolor de ligamento redondo no es un problema serio y no ocasiona daos al beb. CAUSAS Durante el embarazo el tero crece mayormente desde el segundo trimestre hasta el parto. A medida que crece, se estira y tuerce ligeramente los ligamentos. Cuando el tero ejerce presin en ambos lados, el ligamento redondo del lado opuesto presiona y se estira. Esto causa dolor. SNTOMAS El dolor puede ocurrir en uno o ambos lados. El dolor es por lo general como un pellizco corto y filoso. A veces puede ser un dolor opaco y persistente. Se siente en la parte baja del abdomen o en la ingle. Es un dolor interno, y por lo general comienza en la ingle y se mueve hacia la zona de la cadera. El dolor puede ocurrir con:  Cambio de posicin repentino como el levantarse de la cama o de una silla.  Darse vuelta en la cama.  Toser o estornudar.  Caminar demasiado.  Cualquier tipo de actividad fsica. DIAGNSTICO El medico deber asegurarse de que no existen problemas graves que ocasionan el dolor. Si no encuentra nada grave, los sntomas suelen indicar de que se trata de un dolor proveniente del ligamento redondo. TRATAMIENTO  Sintese y reljese cuando el dolor comience.  Llevar las rodillas hacia el estmago.  Recustese sobre un lado con una almohada debajo del vientre (abdomen) y otra entre sus piernas.  Sintese en un bao caliente de 15 a 20 minutos o hasta que el dolor desaparezca. INSTRUCCIONES PARA EL CUIDADO DOMICILIARIO  Utilice los medicamentos de venta libre o de  prescripcin para el dolor, el malestar o la fiebre, segn se lo indique el profesional que lo asiste.  Sintese y pngase de pie lentamente.  Evite caminatas largas si le ocasionan dolor.  Detenga o disminuya las actividades fsicas si le ocasionan dolor. SOLICITE ATENCIN MDICA SI:  El dolor no desaparece con estas medidas.  Necesita que le prescriban medicamentos ms fuertes para el dolor.  Desarrolla un dolor de espalda que no haba sentido antes junto con el de lado. SOLICITE ATENCIN MDICA DE INMEDIATO SI:  La temperatura se eleva por encima de 38,9 C (102 F) o ms.  Siente contracciones uterinas.  Presenta hemorragia vaginal.  Presenta nuseas, diarrea o vmitos.  Comienza a sentir escalofros  Siente dolor al orinar. Document Released: 10/12/2008 Document Revised: 01/22/2012 ExitCare Patient Information 2014 [employer].  

## 2013-04-30 NOTE — Progress Notes (Signed)
Rash much improved with OTC steroid cream. Plans Mirena. Glucola nl. Fatigue, works in [employer] but not dizzy spells. Hgb 10.5> advised PNV in am, iron supplement in pm.

## 2013-04-30 NOTE — Progress Notes (Signed)
Pulse: 90

## 2013-05-08 ENCOUNTER — Inpatient Hospital Stay (HOSPITAL_COMMUNITY)
Admission: AD | Admit: 2013-05-08 | Discharge: 2013-05-08 | Disposition: A | Discharge status: Home / Self Care | Payer: No Typology Code available for payment source | Source: Ambulatory Visit | Attending: Obstetrics & Gynecology | Admitting: Obstetrics & Gynecology

## 2013-05-08 ENCOUNTER — Inpatient Hospital Stay (HOSPITAL_COMMUNITY): Payer: No Typology Code available for payment source

## 2013-05-08 ENCOUNTER — Encounter (HOSPITAL_COMMUNITY): Payer: Self-pay | Admitting: *Deleted

## 2013-05-08 DIAGNOSIS — R109 Unspecified abdominal pain: Secondary | ICD-10-CM | POA: Insufficient documentation

## 2013-05-08 DIAGNOSIS — M25559 Pain in unspecified hip: Secondary | ICD-10-CM

## 2013-05-08 DIAGNOSIS — W010XXA Fall on same level from slipping, tripping and stumbling without subsequent striking against object, initial encounter: Secondary | ICD-10-CM | POA: Insufficient documentation

## 2013-05-08 DIAGNOSIS — Y93F1 Activity, caregiving, bathing: Secondary | ICD-10-CM | POA: Insufficient documentation

## 2013-05-08 DIAGNOSIS — O4700 False labor before 37 completed weeks of gestation, unspecified trimester: Secondary | ICD-10-CM | POA: Insufficient documentation

## 2013-05-08 DIAGNOSIS — S79912A Unspecified injury of left hip, initial encounter: Secondary | ICD-10-CM

## 2013-05-08 DIAGNOSIS — S79919A Unspecified injury of unspecified hip, initial encounter: Secondary | ICD-10-CM

## 2013-05-08 DIAGNOSIS — O99891 Other specified diseases and conditions complicating pregnancy: Secondary | ICD-10-CM

## 2013-05-08 DIAGNOSIS — Y92009 Unspecified place in unspecified non-institutional (private) residence as the place of occurrence of the external cause: Secondary | ICD-10-CM | POA: Insufficient documentation

## 2013-05-08 LAB — FETAL FIBRONECTIN: Fetal Fibronectin: NEGATIVE

## 2013-05-08 MED ORDER — HYDROCODONE-ACETAMINOPHEN 5-325 MG PO TABS
2.0000 | ORAL_TABLET | Freq: Once | ORAL | Status: AC
  Administered 2013-05-08: 2 via ORAL
  Filled 2013-05-08: qty 2

## 2013-05-08 NOTE — [facility] Provider Note (Signed)
First Provider Initiated Contact with Patient 05/08/13 2005      Chief Complaint:  Fall and Abdominal Pain   Debra Allen is  27 y.o. [id number] at [redacted]w[redacted]d presents complaining of Fall and Abdominal Pain She fell in the bathtub yesterday afternoon, landing on her left hip.  Today at 0800, h er 2 yo child jumped on her belly.  Her left hip is tender and her lower abdomen feels pressure at times.  Denies bleeding.  Has a mucous discharge, and is sometimes itchy externally.   Past Medical History: Past Medical History  Diagnosis Date  . Headache(784.0)   . Urinary tract infection     Past Surgical History: Past Surgical History  Procedure Laterality Date  . No past surgeries      Family History: Family History  Problem Relation Age of Onset  . Other Neg Hx   . Diabetes Mother   . Hypertension Maternal Grandmother   . Diabetes Maternal Grandmother   . Cancer Paternal Grandmother   . Cancer Paternal Grandfather     Social History: History  Substance Use Topics  . Smoking status: Never Smoker   . Smokeless tobacco: Never Used  . Alcohol Use: No    Allergies:  Allergies  Allergen Reactions  . Keflex (Cephalexin) Itching and Rash    Meds:  Prescriptions prior to admission  Medication Sig Dispense Refill  . acetaminophen (TYLENOL) 325 MG tablet Take 650 mg by mouth every 6 (six) hours as needed for pain (For headache.).      Marland Kitchen hydrOXYzine (ATARAX/VISTARIL) 25 MG tablet Take 1 tablet (25 mg total) by mouth every 6 (six) hours as needed for itching.  60 tablet  3  . Prenatal Vit-Fe Fumarate-FA (PRENATAL MULTIVITAMIN) TABS Take 1 tablet by mouth daily at 12 noon.         Review of Systems   Constitutional: Negative for fever and chills Eyes: Negative for visual disturbances Respiratory: Negative for shortness of breath, dyspnea Cardiovascular: Negative for chest pain or palpitations  Gastrointestinal: Negative for vomiting, diarrhea and  constipation Genitourinary: Negative for dysuria and urgency Musculoskeletal: Negative for back pain, joint pain.  POSITIVE for Left hip pain Neurological: Negative for dizziness and headaches    Physical Exam  Blood pressure 103/66, pulse 89, temperature 97.4 F (36.3 C), temperature source Oral, resp. rate 18, height 5' 1.25" (1.556 m), weight 78.926 kg (174 lb), last menstrual period 08/13/2012, SpO2 100.00%. GENERAL: Well-developed, well-nourished female in no acute distress.  LUNGS: Clear to auscultation bilaterally.  HEART: Regular rate and rhythm. ABDOMEN: Soft, nontender, nondistended, gravid.  EXTREMITIES: Nontender, no edema, 2+ distal pulses. SSE:  mucousy discharge; vagina appears normal, no odor, rash.  Wet Prep negative for yeast, clue, WBC and trich. CERVICAL EXAM: Dilatation 0cm   Effacement 0%   Station -3   Presentation: cephalic FHT:  Baseline rate 150 bpm   Variability moderate  Accelerations present   Decelerations none Contractions: Had several the first 1/2 hour, so a fFn was collected:  NEGATIVE.  Contractions have since spaced out to rare   Labs: Results for orders placed during the hospital encounter of 05/08/13 (from the past 24 hour(s))  FETAL FIBRONECTIN   Collection Time    05/08/13  8:10 PM      Result Value Range   Fetal Fibronectin NEGATIVE  NEGATIVE   Imaging Studies:  *RADIOLOGY REPORT*   Clinical Data: Fall.  Pain.  Question abruption.   LIMITED OBSTETRIC ULTRASOUND   Number of Fetuses:  1 Heart Rate: 152 bpm Movement: Present. Breathing: Not assessed. Presentation: Cephalic Placental Location: Anterior.  No abnormality. Previa: Absent Amniotic Fluid (Subjective): Normal   AFI 10.98 cm  (5%ile = 7.7 cm; 95%ile = 14.6 cm)   BPD:  8.01 cm     32 w  2 d   MATERNAL FINDINGS: Cervix: Not assessed. Uterus/Adnexae:  Ovaries not visualized.   IMPRESSION: Anterior placenta without evidence of abruption.   Recommend followup with  non-emergent complete OB 14+ wk US examination for fetal biometric evaluation and anatomic survey if not already performed.     Original Report Authenticated By: Charlett Nose, M.D.  Assessment: Debra Allen is  27 y.o. [id number] at [redacted]w[redacted]d presents with S/P fall.  Plan: Pt feels better after PO Hydrocodone. Discussed with Dr. Debroah Loop.  No need for 4 hours of continuous EFM.  D/C home with muscle pain instructions, hydrocortisone prn itching CRESENZO-DISHMAN,Tanayah Squitieri 6/26/20149:15 PM

## 2013-05-08 NOTE — [facility] Note (Signed)
Pt fell in the bathtub yesterday afternoon and today her son fell on her belly while trying to get in the bed.  Pt states she has a lot of abd pain/pressure but more on the left side.  Pt very tender while putting EFM on her.  Denies vaginal bleeding but C/O lots of mucous from vagina which is itchy.  No ROM.  Good fetal movement all day today.  Pt also c/o hand swelling,

## 2013-05-12 ENCOUNTER — Ambulatory Visit: Payer: Self-pay

## 2013-05-14 ENCOUNTER — Ambulatory Visit (INDEPENDENT_AMBULATORY_CARE_PROVIDER_SITE_OTHER): Payer: No Typology Code available for payment source | Admitting: Family

## 2013-05-14 ENCOUNTER — Encounter: Payer: Self-pay | Admitting: *Deleted

## 2013-05-14 VITALS — BP 96/64 | Temp 97.0°F | Wt 172.8 lb

## 2013-05-14 DIAGNOSIS — Z1389 Encounter for screening for other disorder: Secondary | ICD-10-CM

## 2013-05-14 DIAGNOSIS — O2390 Unspecified genitourinary tract infection in pregnancy, unspecified trimester: Secondary | ICD-10-CM | POA: Insufficient documentation

## 2013-05-14 DIAGNOSIS — N898 Other specified noninflammatory disorders of vagina: Secondary | ICD-10-CM

## 2013-05-14 DIAGNOSIS — O9989 Other specified diseases and conditions complicating pregnancy, childbirth and the puerperium: Secondary | ICD-10-CM

## 2013-05-14 DIAGNOSIS — R8271 Bacteriuria: Secondary | ICD-10-CM

## 2013-05-14 DIAGNOSIS — Z3490 Encounter for supervision of normal pregnancy, unspecified, unspecified trimester: Secondary | ICD-10-CM

## 2013-05-14 DIAGNOSIS — R82998 Other abnormal findings in urine: Secondary | ICD-10-CM

## 2013-05-14 LAB — POCT URINALYSIS DIP (DEVICE)
Ketones, ur: NEGATIVE mg/dL
Protein, ur: NEGATIVE mg/dL
Specific Gravity, Urine: 1.015 (ref 1.005–1.030)

## 2013-05-14 MED ORDER — NITROFURANTOIN MONOHYD MACRO 100 MG PO CAPS: 100.0000 mg | ORAL_CAPSULE | Freq: Two times a day (BID) | ORAL | Status: DC

## 2013-05-14 NOTE — Progress Notes (Signed)
Dysuria; Mod leuk in urine > RX macrobid and urine culture; white dc with odor > exam, thick white discharge > wet prep sent.  No report of bleeding or leaking of fluid.

## 2013-05-14 NOTE — Progress Notes (Signed)
P = 80   Pt has white vag d/c w/odor. Pt had a fall last week and went to [facility] for evaluation.

## 2013-05-15 LAB — CULTURE, OB URINE: Colony Count: 45000

## 2013-05-15 LAB — WET PREP, GENITAL: Yeast Wet Prep HPF POC: NONE SEEN

## 2013-05-28 ENCOUNTER — Ambulatory Visit (INDEPENDENT_AMBULATORY_CARE_PROVIDER_SITE_OTHER): Payer: No Typology Code available for payment source | Admitting: Advanced Practice Midwife

## 2013-05-28 VITALS — BP 103/67 | Temp 97.0°F | Wt 175.8 lb

## 2013-05-28 DIAGNOSIS — O479 False labor, unspecified: Secondary | ICD-10-CM

## 2013-05-28 DIAGNOSIS — O4703 False labor before 37 completed weeks of gestation, third trimester: Secondary | ICD-10-CM

## 2013-05-28 LAB — POCT URINALYSIS DIP (DEVICE)
Hgb urine dipstick: NEGATIVE
Protein, ur: NEGATIVE mg/dL
Specific Gravity, Urine: 1.02 (ref 1.005–1.030)
Urobilinogen, UA: 0.2 mg/dL (ref 0.0–1.0)
pH: 6 (ref 5.0–8.0)

## 2013-05-28 MED ORDER — NIFEDIPINE 10 MG PO CAPS: 10.0000 mg | ORAL_CAPSULE | Freq: Four times a day (QID) | ORAL | Status: DC | PRN

## 2013-05-28 NOTE — Progress Notes (Signed)
Irregular contractions and mucous at night. Cervix closed. Will offer Procardia PRN until 35 wks. No leaking of water.

## 2013-05-28 NOTE — Progress Notes (Signed)
Pulse- 94 Patient reports not feeling the baby much in the past two days, also reports painful contractions that last an hour and also reports a lot of mucous d/c with constant leaking changing her underwear 3 times a day- states she had brown d/c Saturday and Sunday

## 2013-05-28 NOTE — Patient Instructions (Addendum)
Embarazo  Tercer trimestre  (Pregnancy - Third Trimester) El tercer trimestre del embarazo (los ltimos 3 meses) es el perodo en el cual tanto usted como su beb crecen con ms rapidez. El beb alcanza un largo de aproximadamente 50 cm. y pesa entre 2,700 y 4,500 kg. El beb gana ms tejido graso y est listo para la vida fuera del cuerpo de la madre. Mientras estn en el interior, los bebs tienen perodos de sueo y vigilia, succionan el pulgar y tienen hipo. Quizs sienta pequeas contracciones del tero. Este es el falso trabajo de parto. Tambin se las conoce como contracciones de Braxton-Hicks . Es como una prctica del parto. Los problemas ms habituales de esta etapa del embarazo incluyen mayor dificultad para respirar, hinchazn de las manos y los pies por retencin de lquidos y la necesidad de orinar con ms frecuencia debido a que el tero y el beb presionan sobre la vejiga.  EXAMENES PRENATALES   Durante los exmenes prenatales, deber seguir realizndose anlisis de sangre. Estas pruebas se realizan para controlar su salud y la del beb. Los anlisis de sangre se realizan para conocer los niveles de algunos compuestos de la sangre (hemoglobina). La anemia (bajo nivel de hemoglobina) es frecuente durante el embarazo. Para prevenirla, se administran hierro y vitaminas. Tambin le tomarn nuevas anlisis para descartar diabetes. Podrn repetirle algunas de las pruebas que le hicieron previamente.  En cada visita le medirn el tamao del tero. Esto permite asegurar que el beb se desarrolla adecuadamente, segn la fecha del embarazo.  Le controlarn la presin arterial en cada visita prenatal. Esto es para asegurarse de que no sufre toxemia.  Le harn un anlisis de orina en cada visita prenatal, para descartar infecciones, diabetes y la presencia de protenas.  Tambin en cada visita controlarn su peso. Esto se realiza para asegurarse que aumenta de peso al ritmo indicado y que usted y su  beb evolucionan normalmente.  En algunas ocasiones se realiza una prueba de ultrasonido para confirmar el correcto desarrollo y evolucin del beb. Esta prueba se realiza con ondas sonoras inofensivas para el beb, de modo que el profesional pueda calcular ms precisamente la fecha del parto.  Analice con su mdico los analgsicos y la anestesia que recibir durante el trabajo de parto y el parto.  Comente la posibilidad de que necesite una cesrea y qu anestesia se recibir.  Informe a su mdico si sufre violencia familiar mental o fsica. A veces, se indica la prueba especializada sin estrs, la prueba de tolerancia a las contracciones y el perfil biofsico para asegurarse de que el beb no tiene problemas. El estudio del lquido amnitico que rodea al beb se llama amniocentesis. El lquido amnitico se obtiene introduciendo una aguja en el vientre (abdomen ). En ocasiones se lleva a cabo cerca del final del embarazo, si es necesario inducir a un parto. En este caso se realiza para asegurarse que los pulmones del beb estn lo suficientemente maduros como para que pueda vivir fuera del tero. Si los pulmones no han madurado y es peligroso que el beb nazca, se administrar a la madre una inyeccin de cortisona , 1 a 2 das antes del parto. . Esto ayuda a que los pulmones del beb maduren y sea ms seguro su nacimiento.  CAMBIOS QUE OCURREN EN EL TERCER TRIMESTRE DEL EMBARAZO  Su organismo atravesar numerosos cambios durante el embarazo. Estos pueden variar de una persona a otra. Converse con el profesional que la asiste acerca los cambios que   usted note y que la preocupen.   Durante el ltimo trimestre probablemente sienta un aumento del apetito. Es normal tener "antojos" de ciertas comidas. Esto vara de una persona a otra y de un embarazo a otro.  Podrn aparecer las primeras estras en las caderas, abdomen y mamas. Estos son cambios normales del cuerpo durante el embarazo. No existen  medicamentos ni ejercicios que puedan prevenir estos cambios.  La constipacin puede tratarse con un laxante o agregando fibra a su dieta. Beber grandes cantidades de lquidos, tomar fibras en forma de vegetales, frutas y granos integrales es de gran ayuda.  Tambin es beneficioso practicar actividad fsica. Si ha sido una persona activa hasta el embarazo, podr continuar con la mayora de las actividades durante el mismo. Si ha sido menos activa, puede ser beneficioso que comience con un programa de ejercicios, como realizar caminatas. Consulte con el profesional que la asiste antes de comenzar un programa de ejercicios.  Evite el consumo de cigarrillos, el alcohol, los medicamentos no recetados y las "drogas de la calle" durante el embarazo. Estas sustancias qumicas afectan la formacin y el desarrollo del beb. Evite estas sustancias durante todo el embarazo para asegurar el nacimiento de un beb sano.  Podr sentir dolor de espalda, tener vrices en las venas y hemorroides, o si ya los sufra, pueden empeorar.  Durante el tercer trimestre se cansar con ms facilidad, lo cual es normal.  Los movimientos del beb pueden ser ms fuertes y con ms frecuencia.  Puede que note dificultades para respirar normalmente.  El ombligo puede salir hacia afuera.  A veces sale una secrecin amarilla de las mamas, que se llama calostro.  Podr aparecer una secrecin mucosa con sangre. Esto suele ocurrir entre unos pocos das y una semana antes del parto. INSTRUCCIONES PARA EL CUIDADO EN EL HOGAR   Cumpla con las citas de control. Siga las indicaciones del mdico con respecto al uso de medicamentos, los ejercicios y la dieta.  Durante el embarazo debe obtener nutrientes para usted y para su beb. Consuma alimentos balanceados a intervalos regulares. Elija alimentos como carne, pescado, leche y otros productos lcteos descremados, vegetales, frutas, panes integrales y cereales. El mdico le informar  cul es el aumento de peso ideal.  Las relaciones sexuales pueden continuarse hasta casi el final del embarazo, si no se presentan otros problemas como prdida prematura (antes de tiempo) de lquido amnitico, hemorragia vaginal o dolor en el vientre (abdominal).  Realice actividad fsica todos los das, si no tiene restricciones. Consulte con el profesional que la asiste si no sabe con certeza si determinados ejercicios son seguros. El mayor aumento de peso se producir en los ltimos 2 trimestres del embarazo. El ejercicio ayuda a:  Controlar su peso.  Mantenerse en forma para el trabajo de parto y el parto .  Perder peso despus del parto.  Haga reposo con frecuencia, con las piernas elevadas, o segn lo necesite para evitar los calambres y el dolor de cintura.  Use un buen sostn o como los que se usan para hacer deportes para aliviar la sensibilidad de las mamas. Tambin puede serle til si lo usa mientras duerme. Si pierde calostro, podr utilizar apsitos en el sostn.  No utilice la baera con agua caliente, baos turcos y saunas.   Colquese el cinturn de seguridad cuando conduzca. Este la proteger a usted y al beb en caso de accidente.  Evite comer carne cruda y el contacto con los utensilios y desperdicios de los gatos. Estos   elementos contienen grmenes que pueden causar defectos de nacimiento en el beb.  Es fcil perder algo de orina durante el embarazo. Apretar y fortalecer los msculos de la pelvis la ayudar con este problema. Practique detener la miccin cuando est en el bao. Estos son los mismos msculos que necesita fortalecer. Son tambin los mismos msculos que utiliza cuando trata de evitar despedir gases. Puede practicar apretando estos msculos diez veces, y repetir esto tres veces por da aproximadamente. Una vez que conozca qu msculos debe apretar, no realice estos ejercicios durante la miccin. Puede favorecerle una infeccin si la orina vuelve hacia  atrs.  Pida ayuda si tienen necesidades financieras, teraputicas o nutricionales. El profesional podr ayudarla con respecto a estas necesidades, o derivarla a otros especialistas.  Haga una lista de nmeros telefnicos de emergencia y tngalos disponibles.  Planifique como obtener ayuda de familiares o amigos cuando regrese a casa desde el hospital.  Hacer un ensayo sobre la partida al hospital.  Tome clases prenatales con el padre para entender, practicar y hacer preguntas sobre el trabajo de parto y el alumbramiento.  Preparar la habitacin del beb / busque una guardera.  No viaje fuera de la ciudad a menos que sea absolutamente necesario y con el asesoramiento de su mdico.  Use slo zapatos de tacn bajo o sin tacn para tener mejor equilibrio y evitar cadas. USO DE MEDICAMENTOS Y CONSUMO DE DROGAS DURANTE EL EMBARAZO   Tome las vitaminas apropiadas para esta etapa tal como se le indic. Las vitaminas deben contener un miligramo de cido flico. Guarde todas las vitaminas fuera del alcance de los nios. La ingestin de slo un par de vitaminas o tabletas que contengan hierro pueden ocasionar la muerte en un beb o en un nio pequeo.  Evite el uso de todos los medicamentos, incluyendo hierbas, medicamentos de venta libre, sin receta o que no hayan sido sugeridos por su mdico. Slo tome medicamentos de venta libre o medicamentos recetados para el dolor, el malestar o fiebre como lo indique su mdico. No tome aspirina, ibuprofeno o naproxeno excepto que su mdico se lo indique.  Infrmele al profesional si consume alguna droga.  El alcohol se relaciona con ciertos defectos congnitos. Incluye el sndrome de alcoholismo fetal. Debe evitar absolutamente el consumo de alcohol, en cualquier forma. El fumar produce baja tasa de natalidad y bebs prematuros.  Las drogas ilegales o de la calle son muy perjudiciales para el beb. Estn absolutamente prohibidas. Un beb que nace de una  madre adicta, ser adicto al nacer. Ese beb tendr los mismos sntomas de abstinencia que un adulto. SOLICITE ATENCIN MDICA SI:  Tiene preguntas o preocupaciones relacionadas con el embarazo. Es mejor que llame para formular las preguntas si no puede esperar hasta la prxima visita, que sentirse preocupada por ellas.  SOLICITE ATENCIN MDICA DE INMEDIATO SI:   La temperatura oral le sube a ms de 38,9 C (102 F) o lo que su mdico le indique.  Tiene una prdida de lquido por la vagina (canal de parto). Si sospecha una ruptura de las membranas, tmese la temperatura y llame al profesional para informarlo sobre esto.  Observa unas pequeas manchas, una hemorragia vaginal o elimina cogulos. Notifique al profesional acerca de la cantidad y de cuntos apsitos est utilizando.  Presenta un olor desagradable en la secrecin vaginal y observa un cambio en el color, de transparente a blanco.  Ha vomitado durante ms de 24 horas.  Siente escalofros o le sube la fiebre.  Le   falta el aire.  Siente ardor al orinar.  Baja o sube ms de 2 libras (900 g), o segn lo indicado por el profesional que la asiste.  Observa que sbitamente se le hinchan el rostro, las manos, los pies o las piernas.  Siente dolor en el vientre (abdominal). Las molestias en el ligamento redondo son una causa benigna frecuente de dolor abdominal durante el embarazo. El profesional que la asiste deber evaluarla.  Presenta dolor de cabeza intenso que no se alivia.  Tiene problemas visuales, visin doble o borrosa.  Si no siente los movimientos del beb durante ms de 1 hora. Si piensa que el beb no se mueve tanto como lo haca habitualmente, coma algo que contenga azcar y recustese sobre el lado izquierdo durante una hora. El beb debe moverse al menos 4  5 veces por hora. Comunquese inmediatamente si el beb se mueve menos que lo indicado.  Se cae, se ve involucrada en un accidente automovilstico o sufre algn  tipo de traumatismo.  En su hogar hay violencia mental o fsica. Document Released: 08/09/2005 Document Revised: 07/24/2012 [employer] Patient Information 2014 [employer].  

## 2013-06-04 ENCOUNTER — Ambulatory Visit (INDEPENDENT_AMBULATORY_CARE_PROVIDER_SITE_OTHER): Payer: No Typology Code available for payment source | Admitting: Family

## 2013-06-04 VITALS — BP 94/63 | Temp 97.1°F | Wt 178.9 lb

## 2013-06-04 DIAGNOSIS — O479 False labor, unspecified: Secondary | ICD-10-CM

## 2013-06-04 DIAGNOSIS — O4703 False labor before 37 completed weeks of gestation, third trimester: Secondary | ICD-10-CM

## 2013-06-04 LAB — POCT URINALYSIS DIP (DEVICE)
Hgb urine dipstick: NEGATIVE
Nitrite: NEGATIVE
Protein, ur: NEGATIVE mg/dL
Urobilinogen, UA: 0.2 mg/dL (ref 0.0–1.0)
pH: 6 (ref 5.0–8.0)

## 2013-06-04 NOTE — Progress Notes (Signed)
Pulse- 89 Patient reports lower abdominal pain/pressure

## 2013-06-04 NOTE — Progress Notes (Signed)
Reports intermittent contractions; no bleeding or leaking of fluid.  Reviewed signs of preterm labor.

## 2013-06-11 ENCOUNTER — Encounter: Payer: Self-pay | Admitting: Family Medicine

## 2013-06-11 ENCOUNTER — Ambulatory Visit (INDEPENDENT_AMBULATORY_CARE_PROVIDER_SITE_OTHER): Payer: No Typology Code available for payment source | Admitting: Family Medicine

## 2013-06-11 VITALS — BP 96/63 | Temp 97.0°F | Wt 178.3 lb

## 2013-06-11 DIAGNOSIS — O479 False labor, unspecified: Secondary | ICD-10-CM

## 2013-06-11 DIAGNOSIS — Z1389 Encounter for screening for other disorder: Secondary | ICD-10-CM

## 2013-06-11 DIAGNOSIS — O4702 False labor before 37 completed weeks of gestation, second trimester: Secondary | ICD-10-CM

## 2013-06-11 DIAGNOSIS — R300 Dysuria: Secondary | ICD-10-CM

## 2013-06-11 DIAGNOSIS — Z3490 Encounter for supervision of normal pregnancy, unspecified, unspecified trimester: Secondary | ICD-10-CM

## 2013-06-11 DIAGNOSIS — Z23 Encounter for immunization: Secondary | ICD-10-CM

## 2013-06-11 DIAGNOSIS — Z363 Encounter for antenatal screening for malformations: Secondary | ICD-10-CM

## 2013-06-11 LAB — POCT URINALYSIS DIP (DEVICE)
Nitrite: NEGATIVE
Protein, ur: NEGATIVE mg/dL
pH: 6.5 (ref 5.0–8.0)

## 2013-06-11 MED ORDER — TETANUS-DIPHTH-ACELL PERTUSSIS 5-2.5-18.5 LF-MCG/0.5 IM SUSP
0.5000 mL | Freq: Once | INTRAMUSCULAR | Status: AC
  Administered 2013-06-11: 0.5 mL via INTRAMUSCULAR

## 2013-06-11 NOTE — Progress Notes (Signed)
Pt is a 27 yo G3P2002 here for routine 36 week visit. Still having irregular painful contractions at night from 1pm-4am. Complains of some dysuria x2days.  FM good. No bleeding or lOF  O: See flow sheet  A/P - TDAP - GBS, GC, chl cultures sent - check urine culture - Labor precautions and kick counts discussed.   - f/u 1 week

## 2013-06-11 NOTE — Progress Notes (Signed)
Pulse- 78  Edema-hands  Pain/pressure- lower abd/vaginal   Pt reports "sometimes vaginal discharge is white and sometimes brown"

## 2013-06-11 NOTE — Patient Instructions (Signed)

## 2013-06-12 LAB — CULTURE, OB URINE

## 2013-06-14 ENCOUNTER — Encounter: Payer: Self-pay | Admitting: Family Medicine

## 2013-06-16 ENCOUNTER — Inpatient Hospital Stay (HOSPITAL_COMMUNITY)
Admission: AD | Admit: 2013-06-16 | Discharge: 2013-06-16 | Disposition: A | Discharge status: Home / Self Care | Payer: No Typology Code available for payment source | Source: Ambulatory Visit | Attending: Obstetrics and Gynecology | Admitting: Obstetrics and Gynecology

## 2013-06-16 DIAGNOSIS — O479 False labor, unspecified: Secondary | ICD-10-CM | POA: Insufficient documentation

## 2013-06-16 NOTE — MAU Note (Signed)
Pt states contractions are clsoer and harder

## 2013-06-18 ENCOUNTER — Ambulatory Visit (INDEPENDENT_AMBULATORY_CARE_PROVIDER_SITE_OTHER): Payer: No Typology Code available for payment source | Admitting: Family Medicine

## 2013-06-18 ENCOUNTER — Encounter: Payer: Self-pay | Admitting: Family Medicine

## 2013-06-18 VITALS — BP 106/67 | Temp 97.6°F | Wt 180.5 lb

## 2013-06-18 DIAGNOSIS — Z3490 Encounter for supervision of normal pregnancy, unspecified, unspecified trimester: Secondary | ICD-10-CM

## 2013-06-18 DIAGNOSIS — O2390 Unspecified genitourinary tract infection in pregnancy, unspecified trimester: Secondary | ICD-10-CM

## 2013-06-18 DIAGNOSIS — O4700 False labor before 37 completed weeks of gestation, unspecified trimester: Secondary | ICD-10-CM

## 2013-06-18 DIAGNOSIS — Z3483 Encounter for supervision of other normal pregnancy, third trimester: Secondary | ICD-10-CM

## 2013-06-18 LAB — POCT URINALYSIS DIP (DEVICE)
Bilirubin Urine: NEGATIVE
Glucose, UA: NEGATIVE mg/dL
Ketones, ur: NEGATIVE mg/dL
Nitrite: NEGATIVE

## 2013-06-18 NOTE — Progress Notes (Signed)
Debra Allen is a 27 y.o. G3P2002 at [redacted]w[redacted]d by R=6 here for ROB visit.    +FM, ?LOF, no VB, +Ctx q5-74min Recently evaled in MAU   Eval for fluid today for ferning. Neg  Discussed with Patient:  - Plans to breast feed.  All questions answered. - Continue prenatal vitamins. - Reviewed fetal kick counts Pt to perform daily at a time when the baby is active, lie laterally with both hands on belly in quiet room and count all movements (hiccups, shoulder rolls, obvious kicks, etc); pt is to report to clinic MAU for less than 10 movements felt in a 2 hour time period-pt told as soon as she counts 10 movements the count is complete.  - Routine precautions discussed (depression, infection s/s).   Patient provided with all pertinent phone numbers for emergencies. - RTC for any VB, regular, painful cramps/ctxs occurring at a rate of >2/10 min, fever (100.5 or higher), n/v/d, any pain that is unresolving or worsening, LOF, decreased fetal movement, CP, SOB, edema -RTC in one week for next visit.  Problems: Patient Active Problem List   Diagnosis Date Noted  . Preterm uterine contractions 05/28/2013  . Infections of genitourinary tract antepartum(646.63) 05/14/2013  . Pica 04/16/2013  . Rash and nonspecific skin eruption 02/19/2013  . Normal intrauterine pregnancy on prenatal ultrasound 02/12/2013  . Headache in pregnancy, antepartum 01/22/2013    To Do: 1. NTD  [x ] Vaccines: Flu:  Tdap: recd [ x] BCM: mirena  [x ] Readiness: baby has a place to sleep, car seat, other baby necessities.  Edu: [ x]L precautions; [ ]  BF class; [ ]  childbirth class; [ ]   BF counseling;

## 2013-06-18 NOTE — Patient Instructions (Addendum)
Embarazo  Tercer trimestre  (Pregnancy - Third Trimester) El tercer trimestre del embarazo (los ltimos 3 meses) es el perodo en el cual tanto usted como su beb crecen con ms rapidez. El beb alcanza un largo de aproximadamente 50 cm. y pesa entre 2,700 y 4,500 kg. El beb gana ms tejido graso y est listo para la vida fuera del cuerpo de la madre. Mientras estn en el interior, los bebs tienen perodos de sueo y vigilia, succionan el pulgar y tienen hipo. Quizs sienta pequeas contracciones del tero. Este es el falso trabajo de parto. Tambin se las conoce como contracciones de Braxton-Hicks . Es como una prctica del parto. Los problemas ms habituales de esta etapa del embarazo incluyen mayor dificultad para respirar, hinchazn de las manos y los pies por retencin de lquidos y la necesidad de orinar con ms frecuencia debido a que el tero y el beb presionan sobre la vejiga.  EXAMENES PRENATALES   Durante los exmenes prenatales, deber seguir realizndose anlisis de sangre. Estas pruebas se realizan para controlar su salud y la del beb. Los anlisis de sangre se realizan para conocer los niveles de algunos compuestos de la sangre (hemoglobina). La anemia (bajo nivel de hemoglobina) es frecuente durante el embarazo. Para prevenirla, se administran hierro y vitaminas. Tambin le tomarn nuevas anlisis para descartar diabetes. Podrn repetirle algunas de las pruebas que le hicieron previamente.  En cada visita le medirn el tamao del tero. Esto permite asegurar que el beb se desarrolla adecuadamente, segn la fecha del embarazo.  Le controlarn la presin arterial en cada visita prenatal. Esto es para asegurarse de que no sufre toxemia.  Le harn un anlisis de orina en cada visita prenatal, para descartar infecciones, diabetes y la presencia de protenas.  Tambin en cada visita controlarn su peso. Esto se realiza para asegurarse que aumenta de peso al ritmo indicado y que usted y su  beb evolucionan normalmente.  En algunas ocasiones se realiza una prueba de ultrasonido para confirmar el correcto desarrollo y evolucin del beb. Esta prueba se realiza con ondas sonoras inofensivas para el beb, de modo que el profesional pueda calcular ms precisamente la fecha del parto.  Analice con su mdico los analgsicos y la anestesia que recibir durante el trabajo de parto y el parto.  Comente la posibilidad de que necesite una cesrea y qu anestesia se recibir.  Informe a su mdico si sufre violencia familiar mental o fsica. A veces, se indica la prueba especializada sin estrs, la prueba de tolerancia a las contracciones y el perfil biofsico para asegurarse de que el beb no tiene problemas. El estudio del lquido amnitico que rodea al beb se llama amniocentesis. El lquido amnitico se obtiene introduciendo una aguja en el vientre (abdomen ). En ocasiones se lleva a cabo cerca del final del embarazo, si es necesario inducir a un parto. En este caso se realiza para asegurarse que los pulmones del beb estn lo suficientemente maduros como para que pueda vivir fuera del tero. Si los pulmones no han madurado y es peligroso que el beb nazca, se administrar a la madre una inyeccin de cortisona , 1 a 2 das antes del parto. . Esto ayuda a que los pulmones del beb maduren y sea ms seguro su nacimiento.  CAMBIOS QUE OCURREN EN EL TERCER TRIMESTRE DEL EMBARAZO  Su organismo atravesar numerosos cambios durante el embarazo. Estos pueden variar de una persona a otra. Converse con el profesional que la asiste acerca los cambios que   usted note y que la preocupen.   Durante el ltimo trimestre probablemente sienta un aumento del apetito. Es normal tener "antojos" de ciertas comidas. Esto vara de una persona a otra y de un embarazo a otro.  Podrn aparecer las primeras estras en las caderas, abdomen y mamas. Estos son cambios normales del cuerpo durante el embarazo. No existen  medicamentos ni ejercicios que puedan prevenir estos cambios.  La constipacin puede tratarse con un laxante o agregando fibra a su dieta. Beber grandes cantidades de lquidos, tomar fibras en forma de vegetales, frutas y granos integrales es de gran ayuda.  Tambin es beneficioso practicar actividad fsica. Si ha sido una persona activa hasta el embarazo, podr continuar con la mayora de las actividades durante el mismo. Si ha sido menos activa, puede ser beneficioso que comience con un programa de ejercicios, como realizar caminatas. Consulte con el profesional que la asiste antes de comenzar un programa de ejercicios.  Evite el consumo de cigarrillos, el alcohol, los medicamentos no recetados y las "drogas de la calle" durante el embarazo. Estas sustancias qumicas afectan la formacin y el desarrollo del beb. Evite estas sustancias durante todo el embarazo para asegurar el nacimiento de un beb sano.  Podr sentir dolor de espalda, tener vrices en las venas y hemorroides, o si ya los sufra, pueden empeorar.  Durante el tercer trimestre se cansar con ms facilidad, lo cual es normal.  Los movimientos del beb pueden ser ms fuertes y con ms frecuencia.  Puede que note dificultades para respirar normalmente.  El ombligo puede salir hacia afuera.  A veces sale una secrecin amarilla de las mamas, que se llama calostro.  Podr aparecer una secrecin mucosa con sangre. Esto suele ocurrir entre unos pocos das y una semana antes del parto. INSTRUCCIONES PARA EL CUIDADO EN EL HOGAR   Cumpla con las citas de control. Siga las indicaciones del mdico con respecto al uso de medicamentos, los ejercicios y la dieta.  Durante el embarazo debe obtener nutrientes para usted y para su beb. Consuma alimentos balanceados a intervalos regulares. Elija alimentos como carne, pescado, leche y otros productos lcteos descremados, vegetales, frutas, panes integrales y cereales. El mdico le informar  cul es el aumento de peso ideal.  Las relaciones sexuales pueden continuarse hasta casi el final del embarazo, si no se presentan otros problemas como prdida prematura (antes de tiempo) de lquido amnitico, hemorragia vaginal o dolor en el vientre (abdominal).  Realice actividad fsica todos los das, si no tiene restricciones. Consulte con el profesional que la asiste si no sabe con certeza si determinados ejercicios son seguros. El mayor aumento de peso se producir en los ltimos 2 trimestres del embarazo. El ejercicio ayuda a:  Controlar su peso.  Mantenerse en forma para el trabajo de parto y el parto .  Perder peso despus del parto.  Haga reposo con frecuencia, con las piernas elevadas, o segn lo necesite para evitar los calambres y el dolor de cintura.  Use un buen sostn o como los que se usan para hacer deportes para aliviar la sensibilidad de las mamas. Tambin puede serle til si lo usa mientras duerme. Si pierde calostro, podr utilizar apsitos en el sostn.  No utilice la baera con agua caliente, baos turcos y saunas.   Colquese el cinturn de seguridad cuando conduzca. Este la proteger a usted y al beb en caso de accidente.  Evite comer carne cruda y el contacto con los utensilios y desperdicios de los gatos. Estos   elementos contienen grmenes que pueden causar defectos de nacimiento en el beb.  Es fcil perder algo de orina durante el embarazo. Apretar y fortalecer los msculos de la pelvis la ayudar con este problema. Practique detener la miccin cuando est en el bao. Estos son los mismos msculos que necesita fortalecer. Son tambin los mismos msculos que utiliza cuando trata de evitar despedir gases. Puede practicar apretando estos msculos diez veces, y repetir esto tres veces por da aproximadamente. Una vez que conozca qu msculos debe apretar, no realice estos ejercicios durante la miccin. Puede favorecerle una infeccin si la orina vuelve hacia  atrs.  Pida ayuda si tienen necesidades financieras, teraputicas o nutricionales. El profesional podr ayudarla con respecto a estas necesidades, o derivarla a otros especialistas.  Haga una lista de nmeros telefnicos de emergencia y tngalos disponibles.  Planifique como obtener ayuda de familiares o amigos cuando regrese a casa desde el hospital.  Hacer un ensayo sobre la partida al hospital.  Tome clases prenatales con el padre para entender, practicar y hacer preguntas sobre el trabajo de parto y el alumbramiento.  Preparar la habitacin del beb / busque una guardera.  No viaje fuera de la ciudad a menos que sea absolutamente necesario y con el asesoramiento de su mdico.  Use slo zapatos de tacn bajo o sin tacn para tener mejor equilibrio y evitar cadas. USO DE MEDICAMENTOS Y CONSUMO DE DROGAS DURANTE EL EMBARAZO   Tome las vitaminas apropiadas para esta etapa tal como se le indic. Las vitaminas deben contener un miligramo de cido flico. Guarde todas las vitaminas fuera del alcance de los nios. La ingestin de slo un par de vitaminas o tabletas que contengan hierro pueden ocasionar la muerte en un beb o en un nio pequeo.  Evite el uso de todos los medicamentos, incluyendo hierbas, medicamentos de venta libre, sin receta o que no hayan sido sugeridos por su mdico. Slo tome medicamentos de venta libre o medicamentos recetados para el dolor, el malestar o fiebre como lo indique su mdico. No tome aspirina, ibuprofeno o naproxeno excepto que su mdico se lo indique.  Infrmele al profesional si consume alguna droga.  El alcohol se relaciona con ciertos defectos congnitos. Incluye el sndrome de alcoholismo fetal. Debe evitar absolutamente el consumo de alcohol, en cualquier forma. El fumar produce baja tasa de natalidad y bebs prematuros.  Las drogas ilegales o de la calle son muy perjudiciales para el beb. Estn absolutamente prohibidas. Un beb que nace de una  madre adicta, ser adicto al nacer. Ese beb tendr los mismos sntomas de abstinencia que un adulto. SOLICITE ATENCIN MDICA SI:  Tiene preguntas o preocupaciones relacionadas con el embarazo. Es mejor que llame para formular las preguntas si no puede esperar hasta la prxima visita, que sentirse preocupada por ellas.  SOLICITE ATENCIN MDICA DE INMEDIATO SI:   La temperatura oral le sube a ms de 38,9 C (102 F) o lo que su mdico le indique.  Tiene una prdida de lquido por la vagina (canal de parto). Si sospecha una ruptura de las membranas, tmese la temperatura y llame al profesional para informarlo sobre esto.  Observa unas pequeas manchas, una hemorragia vaginal o elimina cogulos. Notifique al profesional acerca de la cantidad y de cuntos apsitos est utilizando.  Presenta un olor desagradable en la secrecin vaginal y observa un cambio en el color, de transparente a blanco.  Ha vomitado durante ms de 24 horas.  Siente escalofros o le sube la fiebre.  Le   falta el aire.  Siente ardor al orinar.  Baja o sube ms de 2 libras (900 g), o segn lo indicado por el profesional que la asiste.  Observa que sbitamente se le hinchan el rostro, las manos, los pies o las piernas.  Siente dolor en el vientre (abdominal). Las molestias en el ligamento redondo son una causa benigna frecuente de dolor abdominal durante el embarazo. El profesional que la asiste deber evaluarla.  Presenta dolor de cabeza intenso que no se alivia.  Tiene problemas visuales, visin doble o borrosa.  Si no siente los movimientos del beb durante ms de 1 hora. Si piensa que el beb no se mueve tanto como lo haca habitualmente, coma algo que contenga azcar y recustese sobre el lado izquierdo durante una hora. El beb debe moverse al menos 4  5 veces por hora. Comunquese inmediatamente si el beb se mueve menos que lo indicado.  Se cae, se ve involucrada en un accidente automovilstico o sufre algn  tipo de traumatismo.  En su hogar hay violencia mental o fsica. Document Released: 08/09/2005 Document Revised: 07/24/2012 [employer] Patient Information 2014 [employer].  Place 24-38 weeks prenatal visit patient instructions here.  

## 2013-06-18 NOTE — Progress Notes (Signed)
Pulse: 91 Doesn't have any discharge that she can see but she keeps feeling like her underwear are wet. Having irregular contractions that are painful. Went to MAU on Monday and was 2 cm dilated.

## 2013-06-20 ENCOUNTER — Encounter (HOSPITAL_COMMUNITY): Payer: Self-pay | Admitting: *Deleted

## 2013-06-20 ENCOUNTER — Inpatient Hospital Stay (HOSPITAL_COMMUNITY)
Admission: AD | Admit: 2013-06-20 | Discharge: 2013-06-20 | Disposition: A | Discharge status: Home / Self Care | Payer: No Typology Code available for payment source | Source: Ambulatory Visit | Attending: Obstetrics and Gynecology | Admitting: Obstetrics and Gynecology

## 2013-06-20 DIAGNOSIS — N949 Unspecified condition associated with female genital organs and menstrual cycle: Secondary | ICD-10-CM | POA: Insufficient documentation

## 2013-06-20 DIAGNOSIS — R109 Unspecified abdominal pain: Secondary | ICD-10-CM | POA: Insufficient documentation

## 2013-06-20 DIAGNOSIS — O99891 Other specified diseases and conditions complicating pregnancy: Secondary | ICD-10-CM | POA: Insufficient documentation

## 2013-06-20 DIAGNOSIS — R51 Headache: Secondary | ICD-10-CM | POA: Insufficient documentation

## 2013-06-20 MED ORDER — BUTALBITAL-APAP-CAFFEINE 50-325-40 MG PO TABS
1.0000 | ORAL_TABLET | Freq: Once | ORAL | Status: AC
  Administered 2013-06-20: 1 via ORAL
  Filled 2013-06-20: qty 1

## 2013-06-20 NOTE — MAU Note (Signed)
Pt presents with complaints of abdominal pain that started around 10 this morning. She also states that she has had a bad headache since 7 this morning. Denies any bleeding but does state that she has has some leakage of clear fluid.

## 2013-06-20 NOTE — MAU Note (Signed)
Pt states having upper abdominal and lower pelvic pain, hurts to walk, no bleeding or lof.

## 2013-06-20 NOTE — MAU Provider Note (Signed)
History     CSN: [id number]  Arrival date and time: 06/20/13 1521   First Provider Initiated Contact with Patient 06/20/13 1734      Chief Complaint  Patient presents with  . Abdominal Pain   HPI 27 y.o. [id number] at [redacted]w[redacted]d presents today for abdominal pain and headache. Headache started this morning, located primarily in front on both sides and feels like her head is being squeezed. Also has pain since 10am located near the top of her stomach and lower abdomen, describes as feeling like she was punched. Has associated dizziness with the headache. While in MAU says that she has had a few episodes of seeing black spots in her vision, but this has not been constant and sounds like it is more when she sits up suddenly. Denies chest pain, shortness of breath, nausea/vomiting.  Prenatal course: Care at [facility] - normal NT, AFP negative - Normal anatomy (spine not well visualized) - Normal GTT 1hr 80  OB History   Grav Para Term Preterm Abortions TAB SAB Ect Mult Living   3 2 2       2     Previous SVD with no complications (8#5oz and 8#).  Past Medical History  Diagnosis Date  . Headache(784.0)   . Urinary tract infection     Past Surgical History  Procedure Laterality Date  . No past surgeries      Family History  Problem Relation Age of Onset  . Other Neg Hx   . Diabetes Mother   . Hypertension Maternal Grandmother   . Diabetes Maternal Grandmother   . Cancer Paternal Grandmother   . Cancer Paternal Grandfather     History  Substance Use Topics  . Smoking status: Never Smoker   . Smokeless tobacco: Never Used  . Alcohol Use: No    Allergies:  Allergies  Allergen Reactions  . Keflex (Cephalexin) Itching and Rash    Prescriptions prior to admission  Medication Sig Dispense Refill  . Prenatal Vit-Fe Fumarate-FA (PRENATAL MULTIVITAMIN) TABS Take 1 tablet by mouth daily at 12 noon.        ROS negative except as above Physical Exam   Blood pressure 100/65, pulse  89, temperature 98.1 F (36.7 C), temperature source Oral, resp. rate 18, height 5\' 4"  (1.626 m), weight 82.668 kg (182 lb 4 oz), last menstrual period 08/13/2012.  Filed Vitals:   06/20/13 1532 06/20/13 1541 06/20/13 1744 06/20/13 1745  BP:  100/65 99/62 101/61  Pulse:  89    Temp:  98.1 F (36.7 C)    TempSrc:  Oral    Resp:  18    Height: 5\' 4"  (1.626 m)     Weight: 82.668 kg (182 lb 4 oz)      I repeated the BP on both arms during examination, both in normal range 99/62 and 101/61  Physical Exam General appearance: alert, cooperative and no distress Head: Normocephalic, without obvious abnormality, atraumatic Lungs: clear to auscultation bilaterally Heart: regular rate and rhythm, S1, S2 normal, no murmur, click, rub or gallop Abdomen: gravid. tenderness in mid-epigastric area and toward the RUQ. Lower abdominal tenderness also more toward RLQ. Baby and uterus feel displaced toward the right side of abdomen. Extremities: no edema, redness or tenderness in the calves or thighs Pulses: 2+ and symmetric Skin: warm and dry Reflexes: 1+ patellar and achilles bilaterally. No clonus.  FHT: 150bpm, mod var, 15x15 accels present, prolonged decel at 17:15 but strip looks great since Toco: periods of irritability, rare  contractions MAU Course  Procedures  MDM  Fioricet given in MAU  Assessment and Plan  26 y.o. [id number] at [redacted]w[redacted]d   Headache likely migraine or dehydration FWB: 1 prolonged decel but reassuring over the course of 2.5 hours after Follow up scheduled for Wednesday Stable for discharge  Tawni Carnes 06/20/2013, 5:57 PM   I have seen and examined this patient and agree with above documentation in the resident's note.   Pt initially presenting with symptoms of preE but in the setting of completely normal BPs. Upon further discussion, it appears that the blurry vision could have been related to a presyncopal episode. She was otherwise feeling well except for the  continued headache.  Suspect dehydration as a possible cause although no clear correlation.  Of note, fetus had a prolonged 4 min decel at 17:20 but then recovered and strip was beautiful and reactive for 2.5 hours after. No further workup was undertaken. SVE not in labor as above. F/u in clinic or sooner if headaches change, worsen, or other symptoms develop.  Rulon Abide, M.D. [facility] Fellow 06/20/2013 10:05 PM

## 2013-06-22 NOTE — MAU Provider Note (Signed)
Reviewed all forms of birth control options available including abstinence; over the counter/barrier methods; hormonal contraceptive medication including pill, patch, ring, Depo-Provera injection, Nexplanon; Mirena and Paragard IUDs; permanent sterilization options including vasectomy, tubal ligation (laparoscopic and hysteroscopic/Essure). Risks and benefits reviewed.  Questions were answered.  Information was given to patient to review. Attestation of Attending Supervision of Advanced Practitioner: Evaluation and management procedures were performed by the PA/NP/CNM/OB Fellow under my supervision/collaboration. Chart reviewed and agree with management and plan. Case discussed, strip reviewed.  Debra Allen V 06/22/2013 12:01 PM

## 2013-06-23 ENCOUNTER — Encounter: Payer: Self-pay | Admitting: *Deleted

## 2013-06-25 ENCOUNTER — Inpatient Hospital Stay (HOSPITAL_COMMUNITY)
Admission: AD | Admit: 2013-06-25 | Discharge: 2013-06-25 | Disposition: A | Discharge status: Home / Self Care | Payer: No Typology Code available for payment source | Source: Ambulatory Visit | Attending: Obstetrics & Gynecology | Admitting: Obstetrics & Gynecology

## 2013-06-25 ENCOUNTER — Encounter (HOSPITAL_COMMUNITY): Payer: Self-pay | Admitting: *Deleted

## 2013-06-25 ENCOUNTER — Ambulatory Visit (INDEPENDENT_AMBULATORY_CARE_PROVIDER_SITE_OTHER): Payer: No Typology Code available for payment source | Admitting: Advanced Practice Midwife

## 2013-06-25 ENCOUNTER — Inpatient Hospital Stay (HOSPITAL_COMMUNITY): Payer: No Typology Code available for payment source

## 2013-06-25 VITALS — BP 96/59 | Temp 97.0°F | Wt 182.0 lb

## 2013-06-25 DIAGNOSIS — O26893 Other specified pregnancy related conditions, third trimester: Secondary | ICD-10-CM

## 2013-06-25 DIAGNOSIS — O2390 Unspecified genitourinary tract infection in pregnancy, unspecified trimester: Secondary | ICD-10-CM

## 2013-06-25 DIAGNOSIS — O368190 Decreased fetal movements, unspecified trimester, not applicable or unspecified: Secondary | ICD-10-CM | POA: Insufficient documentation

## 2013-06-25 DIAGNOSIS — O368131 Decreased fetal movements, third trimester, fetus 1: Secondary | ICD-10-CM

## 2013-06-25 DIAGNOSIS — O479 False labor, unspecified: Secondary | ICD-10-CM | POA: Insufficient documentation

## 2013-06-25 LAB — POCT URINALYSIS DIP (DEVICE)
Bilirubin Urine: NEGATIVE
Glucose, UA: NEGATIVE mg/dL
Ketones, ur: NEGATIVE mg/dL

## 2013-06-25 MED ORDER — ACETAMINOPHEN 500 MG PO TABS
1000.0000 mg | ORAL_TABLET | Freq: Once | ORAL | Status: AC
  Administered 2013-06-25: 1000 mg via ORAL
  Filled 2013-06-25: qty 2

## 2013-06-25 NOTE — MAU Note (Signed)
C/o decreased fetal movement  Since yesterday morning around [id number];

## 2013-06-25 NOTE — MAU Note (Signed)
Has not eaten today;

## 2013-06-25 NOTE — Progress Notes (Signed)
Pulse 93 Edema trace in hands and feet. C/o of pelvic pressure and lower back pain. C/o diarrhea Xdays and feeling dizzy. States vomited past 2 days.

## 2013-06-25 NOTE — MAU Provider Note (Signed)
[facility] OB[facility]N Attending MAU Note  Subjective:  Called to evaluate patient with decreased FM since 1000 yesterday.  No other symptoms;  no contractions, no LOF or vaginal bleeding.   Objective:  Blood pressure 100/62, pulse 79, temperature 97.9 F (36.6 C), temperature source Oral, resp. rate 18, last menstrual period 08/13/2012. FHT  Baseline 130 bpm, moderate variability, +accelerations, no decelerations Toco: q 2 mins Gen: NAD Cervix: Deferred Ext: 2+ DTRs, no edema, no cyanosis, negative Homan's sign  Formal BPP today: 8/8  Assessment & Plan:  27 y.o. G3P2002 at [redacted]w[redacted]d here for decreased FM.  BPP 10/10.  Patient sent home with labor and FM precautions. Follow up in clinic on 07/02/13 at 1:45 pm; this appointment was communicated to the patient.  Jaynie Collins, MD, FACOG Attending Obstetrician & Gynecologist [facility], [facility] [place]

## 2013-06-27 NOTE — Progress Notes (Signed)
Reports little fetal movement in last few days, denies vaginal bleeding, LOF, regular contractions.  She does report pelvic pressure and low back pain which is constant.  She has had some diarrhea today and reports she has not had much water to drink. Encouraged increase in PO fluids.  Was seen in MAU this week for decreased FM, with reactive NST.  Sent to MAU today.

## 2013-07-02 ENCOUNTER — Inpatient Hospital Stay (HOSPITAL_COMMUNITY)
Admission: AD | Admit: 2013-07-02 | Discharge: 2013-07-02 | Disposition: A | Discharge status: Home / Self Care | Payer: MEDICAID | Source: Ambulatory Visit | Attending: Obstetrics & Gynecology | Admitting: Obstetrics & Gynecology

## 2013-07-02 ENCOUNTER — Ambulatory Visit (INDEPENDENT_AMBULATORY_CARE_PROVIDER_SITE_OTHER): Payer: No Typology Code available for payment source | Admitting: Family Medicine

## 2013-07-02 ENCOUNTER — Encounter (HOSPITAL_COMMUNITY): Payer: Self-pay | Admitting: *Deleted

## 2013-07-02 VITALS — BP 104/68 | Wt 184.6 lb

## 2013-07-02 DIAGNOSIS — O368190 Decreased fetal movements, unspecified trimester, not applicable or unspecified: Secondary | ICD-10-CM | POA: Insufficient documentation

## 2013-07-02 DIAGNOSIS — O479 False labor, unspecified: Secondary | ICD-10-CM | POA: Insufficient documentation

## 2013-07-02 DIAGNOSIS — O368130 Decreased fetal movements, third trimester, not applicable or unspecified: Secondary | ICD-10-CM

## 2013-07-02 DIAGNOSIS — O2390 Unspecified genitourinary tract infection in pregnancy, unspecified trimester: Secondary | ICD-10-CM

## 2013-07-02 DIAGNOSIS — Z3490 Encounter for supervision of normal pregnancy, unspecified, unspecified trimester: Secondary | ICD-10-CM

## 2013-07-02 LAB — WET PREP, GENITAL: Yeast Wet Prep HPF POC: NONE SEEN

## 2013-07-02 NOTE — Progress Notes (Signed)
P = 85   Pt reports decreased FM x2 days, has pelvic pressure and increased UC's- approx 5-6 per hr.

## 2013-07-02 NOTE — MAU Provider Note (Signed)
  History     CSN: [id number]  Arrival date and time: 07/02/13 1518   None     Chief Complaint  Patient presents with  . Decreased Fetal Movement   HPI Pt sent to MAU from clinic for report of decreased fetal movement today. Pt reports she has had wet underwear twice a day for the last several days. Leakage is not constant but reported clear. Pt also reports occassional ctx ~5/hr mild. Pt denies vaginal bleeding.  Otherwise pt in usual state of health.  OB History   Grav Para Term Preterm Abortions TAB SAB Ect Mult Living   3 2 2       2       Past Medical History  Diagnosis Date  . Headache(784.0)   . Urinary tract infection     Past Surgical History  Procedure Laterality Date  . No past surgeries      Family History  Problem Relation Age of Onset  . Other Neg Hx   . Diabetes Mother   . Hypertension Maternal Grandmother   . Diabetes Maternal Grandmother   . Cancer Paternal Grandmother   . Cancer Paternal Grandfather     History  Substance Use Topics  . Smoking status: Never Smoker   . Smokeless tobacco: Never Used  . Alcohol Use: No    Allergies:  Allergies  Allergen Reactions  . Keflex [Cephalexin] Itching and Rash    Prescriptions prior to admission  Medication Sig Dispense Refill  . acetaminophen (TYLENOL) 325 MG tablet Take 325 mg by mouth every 6 (six) hours as needed for pain.      . Prenatal Vit-Fe Fumarate-FA (PRENATAL MULTIVITAMIN) TABS Take 1 tablet by mouth daily at 12 noon.        Review of Systems  Constitutional: Negative for fever and chills.  Respiratory: Negative for cough and shortness of breath.   Gastrointestinal: Negative for heartburn, nausea and vomiting.  Genitourinary: Negative for dysuria, urgency, frequency, hematuria and flank pain.  Musculoskeletal: Negative for back pain.  Neurological: Negative for headaches.   Physical Exam   Blood pressure 102/59, pulse 77, temperature 98 F (36.7 C), temperature source Oral,  resp. rate 16, last menstrual period 08/13/2012, SpO2 98.00%.  Physical Exam  Nursing note and vitals reviewed. Constitutional: She appears well-developed and well-nourished. No distress.  Genitourinary: Vagina normal and uterus normal. Guaiac negative stool. No vaginal discharge (normal discharge of pregnancy) found.  Skin: She is not diaphoretic.   Limited third trimester [place]: SIUP, Ant Placenta, FHR 143, VTx, AFI 8.1, greatest pocket >2sm NST: FHR 140s, mod variability, multiple accels no decels Toco: >89m/ctx   MAU Course  Procedures  MDM Reactive NST, Reassuring AFI. Decreased fetal movement persistent but otherwise reassuring  Assessment and Plan  Debra Allen is a 27 y.o. G3P2002 at [redacted]w[redacted]d here for eval of decreased fetal movement. Pt with reassuring NST and limited bedside US. Pt instructed if infant is not moving 10x/2hr within next 24 hr to return for reevaluation. Pt is term and favorable, expect labor in near future. Discharged home.  Tawana Scale 07/02/2013, 4:57 PM

## 2013-07-02 NOTE — MAU Note (Signed)
Patient states she has not felt normal fetal movement for 2 days. States she has had leaking clear fluid for 3 days, not wearing a pad but has to change clothing. Having irregular contractions.

## 2013-07-02 NOTE — Patient Instructions (Signed)

## 2013-07-02 NOTE — Progress Notes (Signed)
Pt reports decreased FM x 2-3 days with 2-3 movements per hr.  Reports irregular ctx up to 5 /hr.  Reports not-continuous leakage of clear fluid x 1 week, and has to change clothes 2x/day.  Denies VB and vaginal discharge.  Reports right-sided abdominal pain where majority of baby lies that is relieved with lifting abdomen.  Denies HA, vision changes.  Will send to MAU for NST and [facility]

## 2013-07-02 NOTE — MAU Note (Signed)
Pt had appointment in the clinic today and was sent up for further evaluation. Pt complaining of decreased fetal movement and ? Leaking of fluids pt states she underwear have been wet

## 2013-07-02 NOTE — Progress Notes (Signed)
27 yo G3P2002 @ [redacted]w[redacted]d who is here for return ob appt.  - states has been having decreased FM. Has had before but now in the last 3 days is only feeling her baby once every 2-3 hours. Has tried kick counts and drinking sugary drinks without help - also has intermittent watery discharge but states there are times it does  - having irregular ctx - no vb  O: see flowsheet. SVE 1.5/40/ballotable  A/P - discussed with Diane Day and unable to do NST in clinic today - will send to MAU for NST and possible fern for r/o rupture although story is unlikely - cervix unfavorable at this time for induction - pt agrees to the plan

## 2013-07-03 LAB — POCT URINALYSIS DIP (DEVICE)
Glucose, UA: NEGATIVE mg/dL
Nitrite: POSITIVE — AB
Specific Gravity, Urine: 1.015 (ref 1.005–1.030)
Urobilinogen, UA: 0.2 mg/dL (ref 0.0–1.0)

## 2013-07-06 ENCOUNTER — Inpatient Hospital Stay (HOSPITAL_COMMUNITY)
Admission: AD | Admit: 2013-07-06 | Discharge: 2013-07-07 | Disposition: A | Discharge status: Home / Self Care | Payer: No Typology Code available for payment source | Source: Ambulatory Visit | Attending: Obstetrics & Gynecology | Admitting: Obstetrics & Gynecology

## 2013-07-06 ENCOUNTER — Encounter (HOSPITAL_COMMUNITY): Payer: Self-pay | Admitting: *Deleted

## 2013-07-06 ENCOUNTER — Inpatient Hospital Stay (HOSPITAL_COMMUNITY): Payer: No Typology Code available for payment source

## 2013-07-06 DIAGNOSIS — O368190 Decreased fetal movements, unspecified trimester, not applicable or unspecified: Secondary | ICD-10-CM | POA: Insufficient documentation

## 2013-07-06 DIAGNOSIS — O368130 Decreased fetal movements, third trimester, not applicable or unspecified: Secondary | ICD-10-CM

## 2013-07-06 NOTE — MAU Provider Note (Signed)
  History     CSN: [id number]  Arrival date and time: 07/06/13 2137   First Provider Initiated Contact with Patient 07/06/13 2224      No chief complaint on file.  HPI Debra Allen is a 27 y.o. G3P2002 at [redacted]w[redacted]d presents for reevaluation of decreased fetal movement. Pt states that she has not felt fetus move throughout the day. Pt reports that she did a dedicated kick count this evening for 2hrs and had <10kicks during that time. She attempted stimulating with PO fluids of juice, ice and water with no improvement. Pt otherwise complains of baseline back pain and occassional ctx.  OB History   Grav Para Term Preterm Abortions TAB SAB Ect Mult Living   3 2 2       2       Past Medical History  Diagnosis Date  . Headache(784.0)   . Urinary tract infection     Past Surgical History  Procedure Laterality Date  . No past surgeries      Family History  Problem Relation Age of Onset  . Other Neg Hx   . Diabetes Mother   . Hypertension Maternal Grandmother   . Diabetes Maternal Grandmother   . Cancer Paternal Grandmother   . Cancer Paternal Grandfather     History  Substance Use Topics  . Smoking status: Never Smoker   . Smokeless tobacco: Never Used  . Alcohol Use: No    Allergies:  Allergies  Allergen Reactions  . Keflex [Cephalexin] Itching and Rash    Prescriptions prior to admission  Medication Sig Dispense Refill  . acetaminophen (TYLENOL) 325 MG tablet Take 325 mg by mouth every 6 (six) hours as needed for pain.      . Prenatal Vit-Fe Fumarate-FA (PRENATAL MULTIVITAMIN) TABS Take 1 tablet by mouth daily.         Review of Systems  Constitutional: Negative for fever and chills.  Eyes: Negative for blurred vision and double vision.  Respiratory: Negative for shortness of breath.   Cardiovascular: Negative for chest pain and palpitations.  Gastrointestinal: Positive for abdominal pain. Negative for heartburn, nausea and vomiting.  Musculoskeletal:  Positive for back pain.  Neurological: Negative for headaches.   Physical Exam   Last menstrual period 08/13/2012.  Physical Exam  Nursing note and vitals reviewed. Constitutional: She appears well-developed and well-nourished. No distress.  Respiratory: Effort normal.  GI: Soft. Bowel sounds are normal. She exhibits no distension and no mass. There is no tenderness. There is no rebound and no guarding.  Skin: She is not diaphoretic.   FHT: 140s mod variability 1 accel 15x15, 1 small variable decel. Toco: q6-69min  MAU Course  Procedures  MDM 1045: Pt with reassuring but non reactive strip. Pt reports that she has felt fetus move since arrival. Will order BPP for further evaluation.  1209: BPP is 8/10 reassuring with strip.   Assessment and Plan  Debra Allen is a 27 y.o. G3P2002 at [redacted]w[redacted]d with decreased fetal movement. Reassuring but nonreactive strip and 8/10 BPP for NST. Will discharge with labor precautions.  Tawana Scale 07/06/2013, 10:35 PM

## 2013-07-06 NOTE — MAU Note (Signed)
Pt concerned because she has not felt her baby move all day today.  Denies contractions, leaking or bleeding.

## 2013-07-07 DIAGNOSIS — O368190 Decreased fetal movements, unspecified trimester, not applicable or unspecified: Secondary | ICD-10-CM

## 2013-07-09 ENCOUNTER — Encounter: Payer: No Typology Code available for payment source | Admitting: Advanced Practice Midwife

## 2013-07-10 ENCOUNTER — Ambulatory Visit (INDEPENDENT_AMBULATORY_CARE_PROVIDER_SITE_OTHER): Payer: No Typology Code available for payment source | Admitting: Obstetrics & Gynecology

## 2013-07-10 ENCOUNTER — Encounter (HOSPITAL_COMMUNITY): Payer: Self-pay | Admitting: *Deleted

## 2013-07-10 ENCOUNTER — Telehealth (HOSPITAL_COMMUNITY): Payer: Self-pay | Admitting: *Deleted

## 2013-07-10 VITALS — BP 112/70 | Temp 99.1°F | Wt 187.0 lb

## 2013-07-10 DIAGNOSIS — O480 Post-term pregnancy: Secondary | ICD-10-CM

## 2013-07-10 DIAGNOSIS — O479 False labor, unspecified: Secondary | ICD-10-CM

## 2013-07-10 DIAGNOSIS — O9989 Other specified diseases and conditions complicating pregnancy, childbirth and the puerperium: Secondary | ICD-10-CM

## 2013-07-10 DIAGNOSIS — O358XX0 Maternal care for other (suspected) fetal abnormality and damage, not applicable or unspecified: Secondary | ICD-10-CM

## 2013-07-10 LAB — POCT URINALYSIS DIP (DEVICE)
Bilirubin Urine: NEGATIVE
Glucose, UA: NEGATIVE mg/dL
Nitrite: NEGATIVE

## 2013-07-10 NOTE — Progress Notes (Signed)
Pt needs induction set at 41 weeks.  No complaints today.  NST reactive.

## 2013-07-10 NOTE — Progress Notes (Signed)
IOL scheduled 07/13/13 at 8 am.

## 2013-07-10 NOTE — Progress Notes (Signed)
Pulse- 89  Edema-hands/feet

## 2013-07-10 NOTE — Telephone Encounter (Signed)
Preadmission screen Interpreter number [phone]

## 2013-07-12 ENCOUNTER — Encounter (HOSPITAL_COMMUNITY): Payer: Self-pay | Admitting: *Deleted

## 2013-07-12 ENCOUNTER — Inpatient Hospital Stay (HOSPITAL_COMMUNITY)
Admission: AD | Admit: 2013-07-12 | Discharge: 2013-07-12 | Disposition: A | Discharge status: Home / Self Care | Payer: Self-pay | Source: Ambulatory Visit | Attending: Obstetrics and Gynecology | Admitting: Obstetrics and Gynecology

## 2013-07-12 DIAGNOSIS — O26893 Other specified pregnancy related conditions, third trimester: Secondary | ICD-10-CM

## 2013-07-12 DIAGNOSIS — O479 False labor, unspecified: Secondary | ICD-10-CM | POA: Insufficient documentation

## 2013-07-12 DIAGNOSIS — O2390 Unspecified genitourinary tract infection in pregnancy, unspecified trimester: Secondary | ICD-10-CM

## 2013-07-12 NOTE — MAU Note (Signed)
Pt states ctx's q5 minutes apart

## 2013-07-13 ENCOUNTER — Inpatient Hospital Stay (HOSPITAL_COMMUNITY): Admission: RE | Admit: 2013-07-13 | Payer: MEDICAID | Source: Ambulatory Visit

## 2013-07-13 ENCOUNTER — Inpatient Hospital Stay (HOSPITAL_COMMUNITY)
Admission: AD | Admit: 2013-07-13 | Discharge: 2013-07-14 | DRG: 774 | Disposition: A | Discharge status: Home / Self Care | Payer: Medicaid Other | Source: Ambulatory Visit | Attending: Obstetrics and Gynecology | Admitting: Obstetrics and Gynecology

## 2013-07-13 DIAGNOSIS — O2390 Unspecified genitourinary tract infection in pregnancy, unspecified trimester: Secondary | ICD-10-CM

## 2013-07-13 LAB — CBC
HCT: 32.2 % — ABNORMAL LOW (ref 36.0–46.0)
Hemoglobin: 9.8 g/dL — ABNORMAL LOW (ref 12.0–15.0)
MCH: 26.6 pg (ref 26.0–34.0)
MCH: 26.6 pg (ref 26.0–34.0)
MCHC: 32.9 g/dL (ref 30.0–36.0)
MCHC: 32.7 g/dL (ref 30.0–36.0)
MCV: 80.7 fL (ref 78.0–100.0)
Platelets: 177 10*3/uL (ref 150–400)
RDW: 15.4 % (ref 11.5–15.5)
RDW: 15.5 % (ref 11.5–15.5)

## 2013-07-13 LAB — TYPE AND SCREEN
ABO/RH(D): O POS
Antibody Screen: NEGATIVE

## 2013-07-13 MED ORDER — ONDANSETRON HCL 4 MG PO TABS: 4.0000 mg | ORAL_TABLET | ORAL | Status: DC | PRN

## 2013-07-13 MED ORDER — WITCH HAZEL-GLYCERIN EX PADS
1.0000 "application " | MEDICATED_PAD | CUTANEOUS | Status: DC | PRN
  Administered 2013-07-13: 1 via TOPICAL

## 2013-07-13 MED ORDER — LACTATED RINGERS IV SOLN
INTRAVENOUS | Status: DC
  Administered 2013-07-13 (×2): via INTRAVENOUS

## 2013-07-13 MED ORDER — OXYTOCIN 10 UNIT/ML IJ SOLN
INTRAMUSCULAR | Status: AC
  Administered 2013-07-13: 10 [IU]
  Filled 2013-07-13: qty 2

## 2013-07-13 MED ORDER — ONDANSETRON HCL 4 MG/2ML IJ SOLN: 4.0000 mg | INTRAMUSCULAR | Status: DC | PRN

## 2013-07-13 MED ORDER — FENTANYL CITRATE 0.05 MG/ML IJ SOLN
INTRAMUSCULAR | Status: AC
  Filled 2013-07-13: qty 2

## 2013-07-13 MED ORDER — MISOPROSTOL 200 MCG PO TABS
ORAL_TABLET | ORAL | Status: AC
  Filled 2013-07-13: qty 5

## 2013-07-13 MED ORDER — DIBUCAINE 1 % RE OINT: 1.0000 "application " | TOPICAL_OINTMENT | RECTAL | Status: DC | PRN

## 2013-07-13 MED ORDER — IBUPROFEN 600 MG PO TABS
600.0000 mg | ORAL_TABLET | Freq: Four times a day (QID) | ORAL | Status: DC | PRN
  Administered 2013-07-13: 600 mg via ORAL
  Filled 2013-07-13: qty 1

## 2013-07-13 MED ORDER — LIDOCAINE HCL (PF) 1 % IJ SOLN
30.0000 mL | INTRAMUSCULAR | Status: DC | PRN
  Filled 2013-07-13 (×2): qty 30

## 2013-07-13 MED ORDER — LANOLIN HYDROUS EX OINT: TOPICAL_OINTMENT | CUTANEOUS | Status: DC | PRN

## 2013-07-13 MED ORDER — CLINDAMYCIN PHOSPHATE 900 MG/50ML IV SOLN
900.0000 mg | Freq: Once | INTRAVENOUS | Status: AC
  Administered 2013-07-13: 900 mg via INTRAVENOUS
  Filled 2013-07-13: qty 50

## 2013-07-13 MED ORDER — MORPHINE SULFATE 4 MG/ML IJ SOLN
4.0000 mg | Freq: Once | INTRAMUSCULAR | Status: AC
  Administered 2013-07-13: 4 mg via INTRAMUSCULAR

## 2013-07-13 MED ORDER — CITRIC ACID-SODIUM CITRATE 334-500 MG/5ML PO SOLN: 30.0000 mL | ORAL | Status: DC | PRN

## 2013-07-13 MED ORDER — DIPHENHYDRAMINE HCL 25 MG PO CAPS: 25.0000 mg | ORAL_CAPSULE | Freq: Four times a day (QID) | ORAL | Status: DC | PRN

## 2013-07-13 MED ORDER — BUTORPHANOL TARTRATE 1 MG/ML IJ SOLN
1.0000 mg | Freq: Once | INTRAMUSCULAR | Status: AC
  Administered 2013-07-13: 1 mg via INTRAVENOUS
  Filled 2013-07-13: qty 1

## 2013-07-13 MED ORDER — OXYCODONE-ACETAMINOPHEN 5-325 MG PO TABS: 1.0000 | ORAL_TABLET | ORAL | Status: DC | PRN

## 2013-07-13 MED ORDER — ONDANSETRON HCL 4 MG/2ML IJ SOLN: 4.0000 mg | Freq: Four times a day (QID) | INTRAMUSCULAR | Status: DC | PRN

## 2013-07-13 MED ORDER — METHYLERGONOVINE MALEATE 0.2 MG/ML IJ SOLN
INTRAMUSCULAR | Status: AC
  Administered 2013-07-13: 0.2 mg via INTRAMUSCULAR
  Filled 2013-07-13: qty 1

## 2013-07-13 MED ORDER — METHYLERGONOVINE MALEATE 0.2 MG PO TABS
0.2000 mg | ORAL_TABLET | Freq: Four times a day (QID) | ORAL | Status: AC
  Administered 2013-07-13 – 2013-07-14 (×4): 0.2 mg via ORAL
  Filled 2013-07-13 (×4): qty 1

## 2013-07-13 MED ORDER — CARBOPROST TROMETHAMINE 250 MCG/ML IM SOLN
INTRAMUSCULAR | Status: AC
  Filled 2013-07-13: qty 1

## 2013-07-13 MED ORDER — ACETAMINOPHEN 325 MG PO TABS: 650.0000 mg | ORAL_TABLET | ORAL | Status: DC | PRN

## 2013-07-13 MED ORDER — TETANUS-DIPHTH-ACELL PERTUSSIS 5-2.5-18.5 LF-MCG/0.5 IM SUSP: 0.5000 mL | Freq: Once | INTRAMUSCULAR | Status: DC

## 2013-07-13 MED ORDER — OXYTOCIN 40 UNITS IN LACTATED RINGERS INFUSION - SIMPLE MED
INTRAVENOUS | Status: AC
  Filled 2013-07-13: qty 1000

## 2013-07-13 MED ORDER — OXYTOCIN BOLUS FROM INFUSION: 500.0000 mL | INTRAVENOUS | Status: DC

## 2013-07-13 MED ORDER — ZOLPIDEM TARTRATE 5 MG PO TABS: 5.0000 mg | ORAL_TABLET | Freq: Every evening | ORAL | Status: DC | PRN

## 2013-07-13 MED ORDER — IBUPROFEN 600 MG PO TABS
600.0000 mg | ORAL_TABLET | Freq: Four times a day (QID) | ORAL | Status: DC
  Administered 2013-07-13 – 2013-07-14 (×4): 600 mg via ORAL
  Filled 2013-07-13 (×5): qty 1

## 2013-07-13 MED ORDER — SIMETHICONE 80 MG PO CHEW: 80.0000 mg | CHEWABLE_TABLET | ORAL | Status: DC | PRN

## 2013-07-13 MED ORDER — OXYTOCIN 40 UNITS IN LACTATED RINGERS INFUSION - SIMPLE MED
62.5000 mL/h | INTRAVENOUS | Status: DC
  Filled 2013-07-13: qty 1000

## 2013-07-13 MED ORDER — METHYLERGONOVINE MALEATE 0.2 MG/ML IJ SOLN: 0.2000 mg | Freq: Four times a day (QID) | INTRAMUSCULAR | Status: AC

## 2013-07-13 MED ORDER — NALOXONE HCL 0.4 MG/ML IJ SOLN
INTRAMUSCULAR | Status: AC
  Filled 2013-07-13: qty 1

## 2013-07-13 MED ORDER — LACTATED RINGERS IV SOLN: 500.0000 mL | INTRAVENOUS | Status: DC | PRN

## 2013-07-13 MED ORDER — MORPHINE SULFATE 4 MG/ML IJ SOLN
INTRAMUSCULAR | Status: AC
  Administered 2013-07-13: 4 mg via INTRAMUSCULAR
  Filled 2013-07-13: qty 1

## 2013-07-13 MED ORDER — OXYCODONE-ACETAMINOPHEN 5-325 MG PO TABS
1.0000 | ORAL_TABLET | ORAL | Status: DC | PRN
  Administered 2013-07-13 (×2): 1 via ORAL
  Filled 2013-07-13 (×2): qty 1

## 2013-07-13 MED ORDER — BENZOCAINE-MENTHOL 20-0.5 % EX AERO
1.0000 "application " | INHALATION_SPRAY | CUTANEOUS | Status: DC | PRN
  Administered 2013-07-13: 1 via TOPICAL
  Filled 2013-07-13: qty 56

## 2013-07-13 MED ORDER — LIDOCAINE HCL (PF) 1 % IJ SOLN
INTRAMUSCULAR | Status: AC
  Filled 2013-07-13: qty 30

## 2013-07-13 MED ORDER — SENNOSIDES-DOCUSATE SODIUM 8.6-50 MG PO TABS
2.0000 | ORAL_TABLET | Freq: Every day | ORAL | Status: DC
  Administered 2013-07-13: 2 via ORAL

## 2013-07-13 MED ORDER — PRENATAL MULTIVITAMIN CH
1.0000 | ORAL_TABLET | Freq: Every day | ORAL | Status: DC
  Administered 2013-07-13 – 2013-07-14 (×2): 1 via ORAL
  Filled 2013-07-13 (×2): qty 1

## 2013-07-13 NOTE — Progress Notes (Signed)
0940- 8/31/14patient with constant trickle boggy uterus,md evaluated order received for methergine series, and bolus of fluids.

## 2013-07-13 NOTE — Progress Notes (Signed)
07-13-13-1530-patient unable to void, uterus boggy at times, on evaluation patient has a hematoma- orders for foley and ice and continue evaluation

## 2013-07-13 NOTE — Progress Notes (Signed)
Dr Pollie Meyer notified of patient, c/o ctx q64m, sve result and patient requesting iv pain medication. Will enter order to admit to l/d unit,.

## 2013-07-13 NOTE — H&P (Signed)
Debra Allen is a 27 y.o. female presenting for spontaneous onset of labor. Says she has had some bleeding and fluid leaking. Baby is moving well.  For [facility] has gone to low risk clinic at [facility]. Denies having any problems during this pregnancy.  History OB History   Grav Para Term Preterm Abortions TAB SAB Ect Mult Living   3 2 2       2      Past Medical History  Diagnosis Date  . Headache(784.0)   . Urinary tract infection    Past Surgical History  Procedure Laterality Date  . No past surgeries     Family History: family history includes Cancer in her paternal grandfather and paternal grandmother; Diabetes in her maternal grandmother and mother; Hypertension in her maternal grandmother. There is no history of Other. Social History:  reports that she has never smoked. She has never used smokeless tobacco. She reports that she does not drink alcohol or use illicit drugs.   Prenatal Transfer Tool  Maternal Diabetes: No Genetic Screening: Normal Maternal Ultrasounds/Referrals: Normal Fetal Ultrasounds or other Referrals:  None Maternal Substance Abuse:  No Significant Maternal Medications:  None Significant Maternal Lab Results:  None Other Comments:  None  ROS + ctx, + LOF, + vb, +FM  Last menstrual period 08/13/2012. Exam Physical Exam  Gen: NAD Heart: RRR Lungs: CTAB, NWOB Abd: gravid but otherwise soft, nontender to palpation Ext: no appreciable lower extremity edema bilaterally Neuro: grossly nonfocal, speech intact GU: normal appearing external genitalia Dilation: 6.5 Effacement (%): 80 Station: -1 Exam by:: peace okehie, rn  Prenatal labs: ABO, Rh: O/POS/-- (02/12 1047) Antibody: NEG (02/12 1047) Rubella: 11.20 (02/12 1047) RPR: NON REAC (06/04 1015)  HBsAg: NEGATIVE (02/12 1047)  HIV:    GBS: Negative (05/02 0000)   Assessment/Plan: Debra Allen is a 27 y.o. G3P2002 at [redacted]w[redacted]d who presents in active labor.  -Admit to L&D  for expectant management -GBS negative -rapid HIV test now as no HIV test documented (unsure why this is) -pt prefers not to have epidural -anticipate NSVD    Levert Feinstein 07/13/2013, 3:09 AM    I examined pt and agree with documentation above and resident plan of care. [facility]

## 2013-07-13 NOTE — Progress Notes (Signed)
Post Partum Day 0  Called to pt room for increased postpartum bleeding. Pt has soaked several pads.   Objective: Blood pressure 102/63, pulse 102, temperature 98.2 F (36.8 C), temperature source Oral, resp. rate 20, height 5\' 4"  (1.626 m), weight 86.183 kg (190 lb), last menstrual period 08/13/2012, SpO2 97.00%.  Physical Exam:  General: alert, cooperative, appears stated age and no distress Lochia: increased from what would be appropriate Uterine Fundus: Soft with numerous clots on vaginal sweep   Recent Labs  07/13/13 0240 07/13/13 0922  HGB 10.6* 9.8*  HCT 32.2* 30.0*   Vaginal sweep with less than firm uterus. Removed several large clots from uterus  Assessment/Plan: Debra Allen is a 27 y.o. G3P2002 at [redacted]w[redacted]d s/p NSVD without tears. For increased bleeding, pt s/p vaginal sweep. Will give 1 dose of clindamycin 900mg  for infection prophylaxis s/p vaginal sweel. Will give IM methergine and 0.2mg  PO q6hrs for 4 doses. Will also give pt dose of IM morphine and percocet PRN for pain control. Repeat CBC in AM.    LOS: 0 days   Montoya Watkin RYAN 07/13/2013, 10:33 AM

## 2013-07-13 NOTE — MAU Note (Signed)
Patient came in c/o contractions every 3 minutes. Patient denies lof, vaginal bleeding.

## 2013-07-14 ENCOUNTER — Encounter (HOSPITAL_COMMUNITY): Payer: Self-pay | Admitting: *Deleted

## 2013-07-14 LAB — CBC
MCV: 81.3 fL (ref 78.0–100.0)
Platelets: 158 10*3/uL (ref 150–400)
RDW: 15.7 % — ABNORMAL HIGH (ref 11.5–15.5)
WBC: 8.6 10*3/uL (ref 4.0–10.5)

## 2013-07-14 MED ORDER — IBUPROFEN 600 MG PO TABS: 600.0000 mg | ORAL_TABLET | Freq: Four times a day (QID) | ORAL | Status: DC

## 2013-07-14 NOTE — Discharge Summary (Signed)
Attestation of Attending Supervision of Advanced Practitioner (CNM/NP): Evaluation and management procedures were performed by the Advanced Practitioner under my supervision and collaboration.  I have reviewed the Advanced Practitioner's note and chart, and I agree with the management and plan.  Charlean Carneal 07/14/2013 12:01 PM

## 2013-07-14 NOTE — Progress Notes (Signed)
UR chart review completed.  

## 2013-07-14 NOTE — Progress Notes (Signed)
Post Partum Day 1 Subjective: no complaints, up ad lib, voiding and tolerating PO No further heavy bleeding  Objective: Blood pressure 101/57, pulse 71, temperature 97.8 F (36.6 C), temperature source Oral, resp. rate 20, height 5\' 4"  (1.626 m), weight 86.183 kg (190 lb), last menstrual period 08/13/2012, SpO2 99.00%, unknown if currently breastfeeding.  Physical Exam:  General: alert and no distress Lochia: appropriate Uterine Fundus: firm Incision: healing well DVT Evaluation: No evidence of DVT seen on physical exam.   Recent Labs  07/13/13 0922 07/14/13 0615  HGB 9.8* 9.5*  HCT 30.0* 29.5*    Assessment/Plan: Plan for discharge tomorrow Wants to stay just in case her bleeding picks up again   LOS: 1 day   [facility] 07/14/2013, 9:36 AM

## 2013-07-14 NOTE — Discharge Summary (Signed)
Obstetric Discharge Summary Reason for Admission: onset of labor Prenatal Procedures: NST Intrapartum Procedures: spontaneous vaginal delivery Postpartum Procedures: none Complications-Operative and Postpartum: hemorrhage Hospital Course: Debra Allen is a 27 y.o. female presenting for spontaneous onset of labor. Says she has had some bleeding and fluid leaking. Baby is moving well.  For [facility] has gone to low risk clinic at [facility]. Denies having any problems during this pregnancy.  Delivery Note  At 5:23 AM a viable and healthy female was delivered via Vaginal, Spontaneous Delivery (Presentation: ; Occiput Anterior). APGAR: 8, 9; weight 9 lb 9.3 oz (4345 g).  Placenta status: Intact, Spontaneous. Cord: 3 vessels with the following complications: None.  Anesthesia: None  Episiotomy: None  Lacerations: None  Est. Blood Loss (mL): 350  Mom to postpartum. Baby to nursery-stable.  [facility]  07/13/2013, 6:06 AM  Had a postpartum hemorrhage after delivery which was treated with expulsion of clots and methergine. Has done well since. No further bleeding since. No dizziness Lochia small to moderate now. Wants to go home early .    Hemoglobin  Date Value Range Status  07/14/2013 9.5* 12.0 - 15.0 g/dL Final     HCT  Date Value Range Status  07/14/2013 29.5* 36.0 - 46.0 % Final    Physical Exam:  General: alert and no distress Lochia: appropriate Uterine Fundus: firm Incision: healing well DVT Evaluation: No evidence of DVT seen on physical exam.  Discharge Diagnoses: Term Pregnancy-delivered Results for orders placed during the hospital encounter of 07/13/13 (from the past 24 hour(s))  CBC     Status: Abnormal   Collection Time    07/14/13  6:15 AM      Result Value Range   WBC 8.6  4.0 - 10.5 K/uL   RBC 3.63 (*) 3.87 - 5.11 MIL/uL   Hemoglobin 9.5 (*) 12.0 - 15.0 g/dL   HCT 16.1 (*) 09.6 - 04.5 %   MCV 81.3  78.0 - 100.0 fL   MCH 26.2  26.0 - 34.0 pg   MCHC 32.2  30.0 - 36.0 g/dL   RDW 40.9 (*) 81.1 - 91.4 %   Platelets 158  150 - 400 K/uL    Discharge Information: Date: 07/14/2013 Activity: pelvic rest Diet: routine Medications: PNV and Ibuprofen Condition: stable and improved Instructions: refer to practice specific booklet Discharge to: home Follow-up Information   [facility] up with [facility] In 6 weeks.      Newborn Data: Live born female  Birth Weight: 9 lb 9.3 oz (4345 g) APGAR: 8, 9  Home with mother.  [facility] 07/14/2013, 11:52 AM

## 2013-07-14 NOTE — Progress Notes (Signed)
Patient requests foley to be taken out. Assessment WNL, no perineal swelling, bruising noted. Dr. Ike Bene notified. Order received. Will continue to monitor patient.

## 2013-07-16 ENCOUNTER — Encounter: Payer: Self-pay | Admitting: *Deleted

## 2013-07-16 NOTE — H&P (Signed)
Attestation of Attending Supervision of Advanced Practitioner (CNM/NP): Evaluation and management procedures were performed by the Advanced Practitioner under my supervision and collaboration.  I have reviewed the Advanced Practitioner's note and chart, and I agree with the management and plan.  Correy Weidner 07/16/2013 1:32 PM

## 2013-08-21 ENCOUNTER — Ambulatory Visit: Payer: No Typology Code available for payment source | Admitting: Advanced Practice Midwife

## 2013-08-21 NOTE — Progress Notes (Unsigned)
  Subjective:     Debra Allen is a 27 y.o. female who presents for a postpartum visit. She is {1-10:13787} {time; units:18646} postpartum following a {delivery:12449}. I have fully reviewed the prenatal and intrapartum course. The delivery was at *** gestational weeks. Outcome: {delivery outcome:32078}. Anesthesia: {anesthesia types:812}. Postpartum course has been ***. Baby's course has been ***. Baby is feeding by formula. Bleeding no bleeding. Bowel function is normal. Bladder function is normal. Patient is sexually active. Contraception method is none. Postpartum depression screening: negative.  The following portions of the patient's history were reviewed and updated as appropriate: allergies, current medications, past family history, past medical history, past social history, past surgical history and problem list.  Review of Systems A comprehensive review of systems was negative.   Objective:    There were no vitals taken for this visit.  General:  alert, cooperative and no distress       Physical Examination: Chest - clear to auscultation, no wheezes, rales or rhonchi, symmetric air entry Heart - normal rate, regular rhythm, normal S1, S2, no murmurs, rubs, clicks or gallops Abdomen - soft, nontender, nondistended, no masses or organomegaly Neurological - alert, oriented, normal speech, no focal findings or movement disorder noted Assessment:     Normal postpartum exam. Pap smear not done at today's visit.   Plan:    1. Contraception: OCP (estrogen/progesterone) 2. Follow up in: 1 year or as needed.

## 2013-09-18 ENCOUNTER — Other Ambulatory Visit: Payer: Self-pay

## 2013-10-02 IMAGING — US US OB TRANSVAGINAL
1 series · 13 of 25 positions shown · non-contrast
Comparison: none

[Series 1: us ob transvaginal · 13 of 25 slices shown]
[im 1/25]
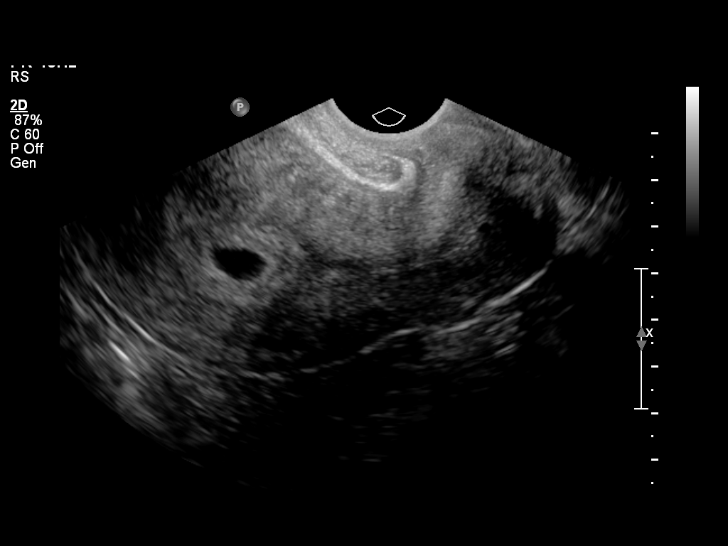
[im 3/25]
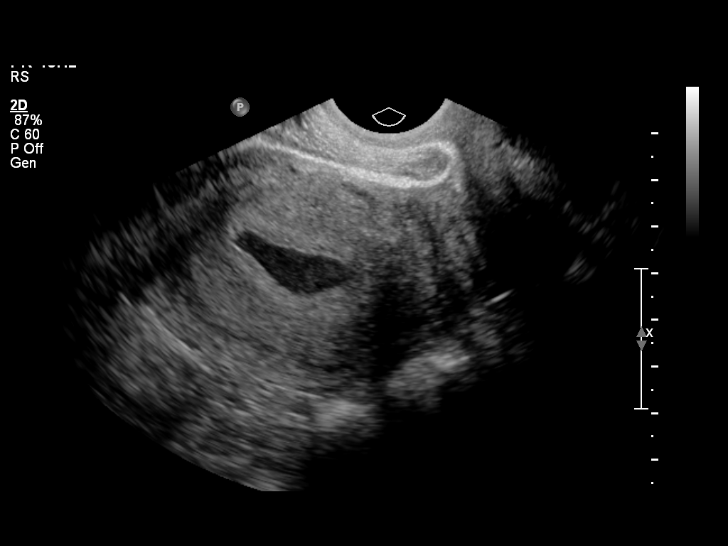
[im 5/25]
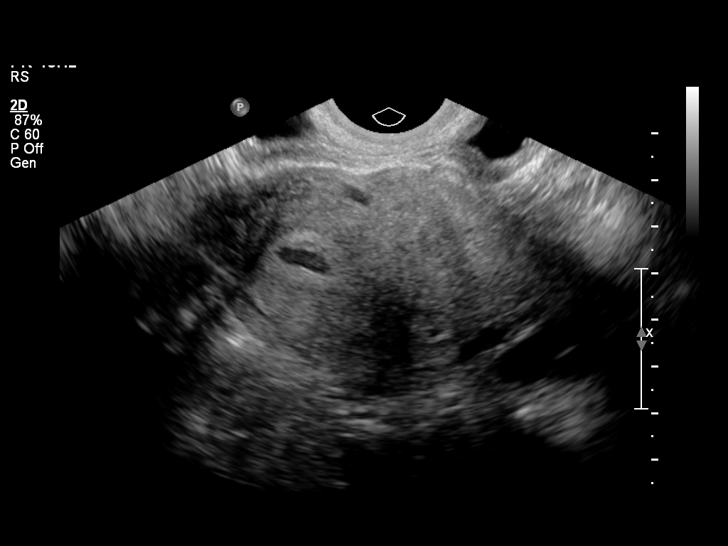
[im 7/25]
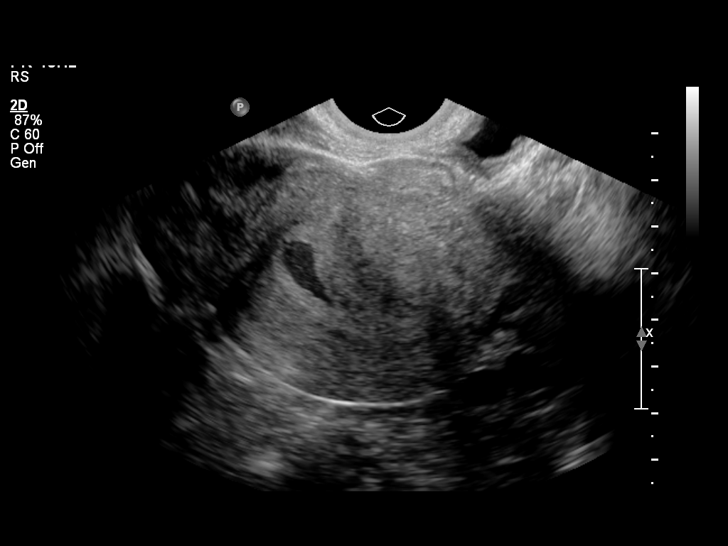
[im 9/25]
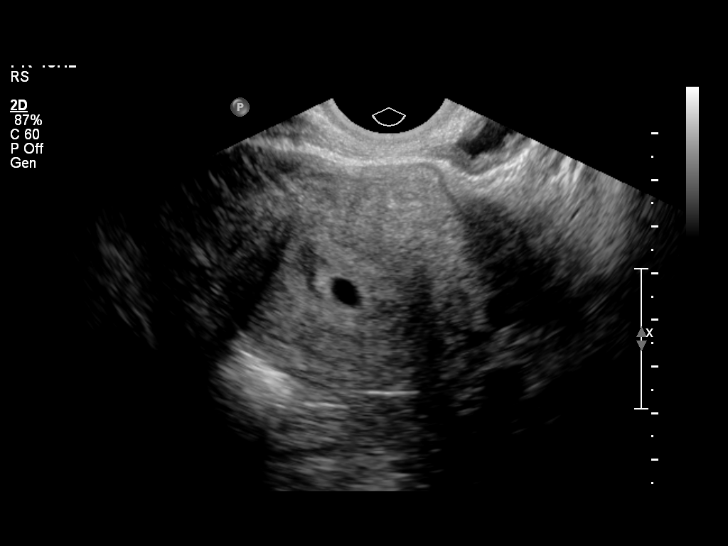
[im 11/25]
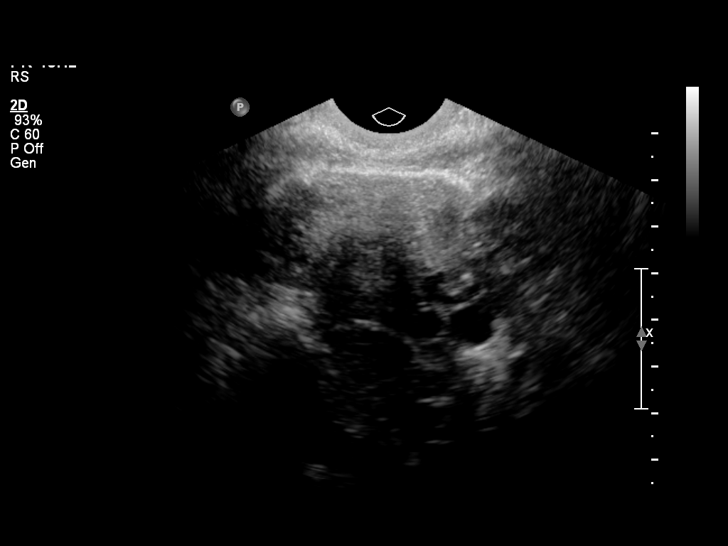
[im 13/25]
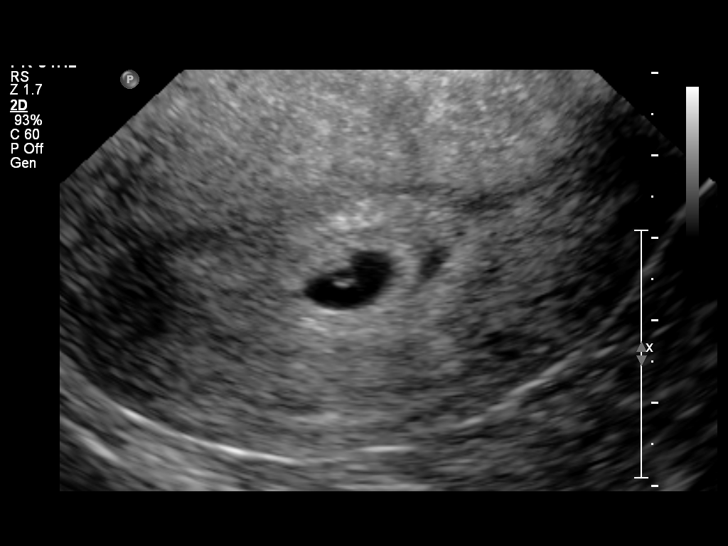
[im 15/25]
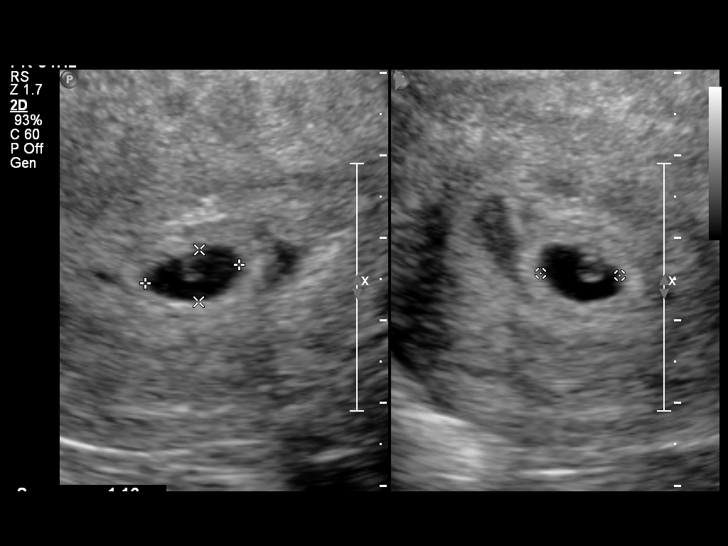
[im 17/25]
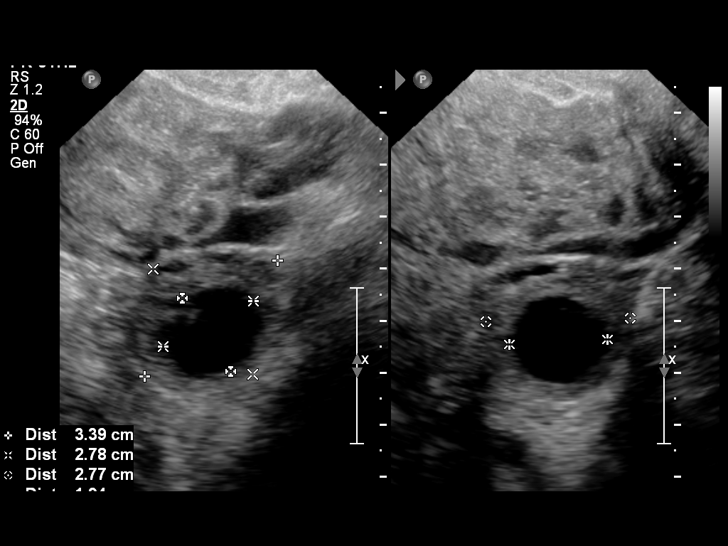
[im 19/25]
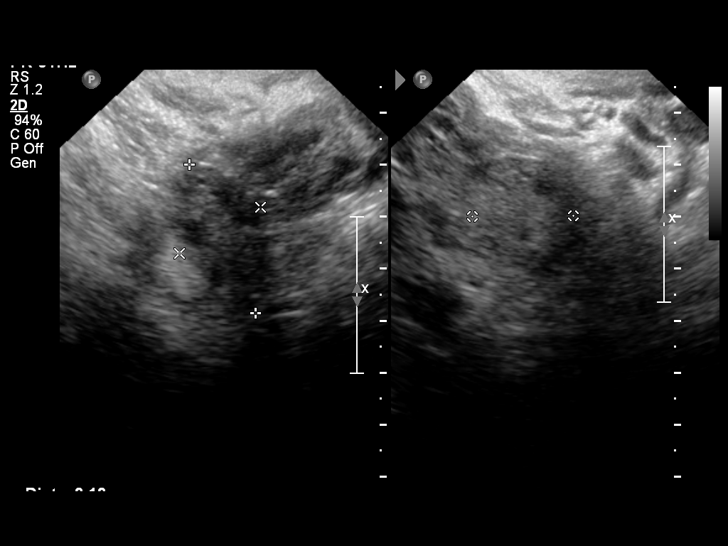
[im 21/25]
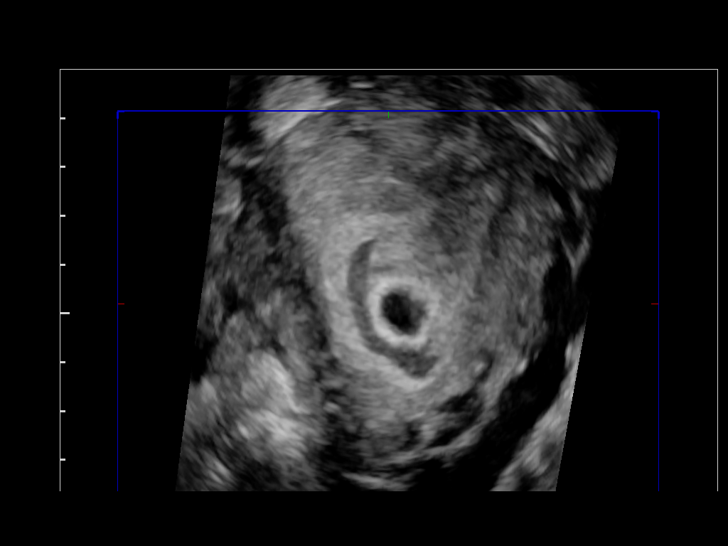
[im 23/25]
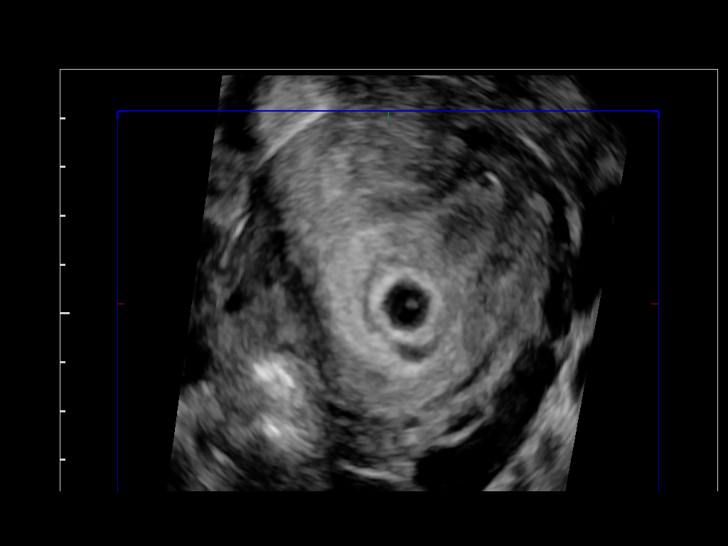
[im 25/25]
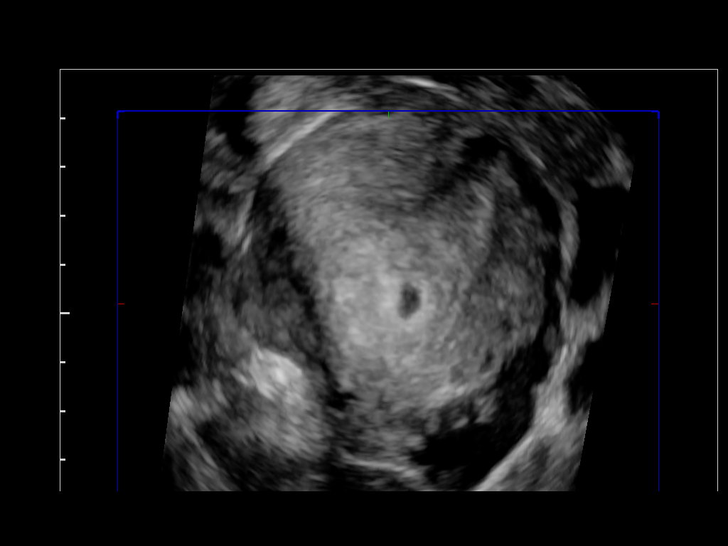

[13 of 25 positions shown; findings below may reference images not displayed]

OBSTETRICS REPORT
                      (Signed Final 11/07/2012 [DATE])

             KURIBAYASHI

Service(s) Provided

 US OB TRANSVAGINAL                                    76817.0
Indications

 Pregnancy with inconclusive fetal viability
Fetal Evaluation

 Num Of Fetuses:    1
 Preg. Location:    Intrauterine
 Gest. Sac:         Intrauterine, large
                    subchorionic bleed
 Yolk Sac:          Visualized
 Fetal Pole:        Not visualized
 Cardiac Activity:  No embryo visualized
Biometry

 GS:       9.2  mm    G. Age:   5w 4d                  EDD:   07/06/13
Cervix Uterus Adnexa

 Cervix:       Normal appearance by transvaginal scan
 Uterus:       Normal shape and size.
 Cul De Sac:   No free fluid seen.
 Left Ovary:   Within normal limits.
 Right Ovary:  Within normal limits.

 Adnexa:     No abnormality visualized.
Impression

 Intrauterine gestational sac and yolk sac are now visualized.
 No embryo is visualized at this time, but not unexpected
 given the small sac diameter.
 Large subchorionic hemorrhage appears similar to ultrasound
 of 10/30/2012.
Recommendations

 Ultrasound follow-up in 10 to 14 days is recommended to
 evaluate progression of the pregnancy.

## 2013-10-03 ENCOUNTER — Encounter: Payer: Self-pay | Admitting: Obstetrics & Gynecology

## 2013-10-03 ENCOUNTER — Ambulatory Visit (INDEPENDENT_AMBULATORY_CARE_PROVIDER_SITE_OTHER): Payer: No Typology Code available for payment source | Admitting: Obstetrics & Gynecology

## 2013-10-03 LAB — POCT URINALYSIS DIP (DEVICE)
Ketones, ur: NEGATIVE mg/dL
Nitrite: NEGATIVE
Specific Gravity, Urine: >=1.03 (ref 1.005–1.030)
Urobilinogen, UA: 0.2 mg/dL (ref 0.0–1.0)
pH: 5.5 (ref 5.0–8.0)

## 2013-10-03 LAB — POCT PREGNANCY, URINE: Preg Test, Ur: NEGATIVE

## 2013-10-03 NOTE — Patient Instructions (Signed)
Levonorgestrel intrauterine device (IUD) What is this medicine? LEVONORGESTREL IUD (LEE voe nor jes trel) is a contraceptive (birth control) device. The device is placed inside the uterus by a healthcare professional. It is used to prevent pregnancy and can also be used to treat heavy bleeding that occurs during your period. Depending on the device, it can be used for 3 to 5 years. This medicine may be used for other purposes; ask your health care provider or pharmacist if you have questions. COMMON BRAND NAME(S): Mirena, Skyla What should I tell my health care provider before I take this medicine? They need to know if you have any of these conditions: -abnormal Pap smear -cancer of the breast, uterus, or cervix -diabetes -endometritis -genital or pelvic infection now or in the past -have more than one sexual partner or your partner has more than one partner -heart disease -history of an ectopic or tubal pregnancy -immune system problems -IUD in place -liver disease or tumor -problems with blood clots or take blood-thinners -use intravenous drugs -uterus of unusual shape -vaginal bleeding that has not been explained -an unusual or allergic reaction to levonorgestrel, other hormones, silicone, or polyethylene, medicines, foods, dyes, or preservatives -pregnant or trying to get pregnant -breast-feeding How should I use this medicine? This device is placed inside the uterus by a health care professional. Talk to your pediatrician regarding the use of this medicine in children. Special care may be needed. Overdosage: If you think you have taken too much of this medicine contact a poison control center or emergency room at once. NOTE: This medicine is only for you. Do not share this medicine with others. What if I miss a dose? This does not apply. What may interact with this medicine? Do not take this medicine with any of the following  medications: -amprenavir -bosentan -fosamprenavir This medicine may also interact with the following medications: -aprepitant -barbiturate medicines for inducing sleep or treating seizures -bexarotene -griseofulvin -medicines to treat seizures like carbamazepine, ethotoin, felbamate, oxcarbazepine, phenytoin, topiramate -modafinil -pioglitazone -rifabutin -rifampin -rifapentine -some medicines to treat HIV infection like atazanavir, indinavir, lopinavir, nelfinavir, tipranavir, ritonavir -St. John's wort -warfarin This list may not describe all possible interactions. Give your health care provider a list of all the medicines, herbs, non-prescription drugs, or dietary supplements you use. Also tell them if you smoke, drink alcohol, or use illegal drugs. Some items may interact with your medicine. What should I watch for while using this medicine? Visit your doctor or health care professional for regular check ups. See your doctor if you or your partner has sexual contact with others, becomes HIV positive, or gets a sexual transmitted disease. This product does not protect you against HIV infection (AIDS) or other sexually transmitted diseases. You can check the placement of the IUD yourself by reaching up to the top of your vagina with clean fingers to feel the threads. Do not pull on the threads. It is a good habit to check placement after each menstrual period. Call your doctor right away if you feel more of the IUD than just the threads or if you cannot feel the threads at all. The IUD may come out by itself. You may become pregnant if the device comes out. If you notice that the IUD has come out use a backup birth control method like condoms and call your health care provider. Using tampons will not change the position of the IUD and are okay to use during your period. What side effects may I   notice from receiving this medicine? Side effects that you should report to your doctor or  health care professional as soon as possible: -allergic reactions like skin rash, itching or hives, swelling of the face, lips, or tongue -fever, flu-like symptoms -genital sores -high blood pressure -no menstrual period for 6 weeks during use -pain, swelling, warmth in the leg -pelvic pain or tenderness -severe or sudden headache -signs of pregnancy -stomach cramping -sudden shortness of breath -trouble with balance, talking, or walking -unusual vaginal bleeding, discharge -yellowing of the eyes or skin Side effects that usually do not require medical attention (report to your doctor or health care professional if they continue or are bothersome): -acne -breast pain -change in sex drive or performance -changes in weight -cramping, dizziness, or faintness while the device is being inserted -headache -irregular menstrual bleeding within first 3 to 6 months of use -nausea This list may not describe all possible side effects. Call your doctor for medical advice about side effects. You may report side effects to FDA at 1-800-FDA-1088. Where should I keep my medicine? This does not apply. NOTE: This sheet is a summary. It may not cover all possible information. If you have questions about this medicine, talk to your doctor, pharmacist, or health care provider.  2014, Elsevier/Gold Standard. (2011-11-30 13:54:04)  

## 2013-10-03 NOTE — Progress Notes (Signed)
Patient ID: [name], female   DOB: 11/23/1985, 27 y.o.   MRN: [id number] Subjective:2 mo postpartum     Debra Allen is a 27 y.o. female who presents for a postpartum visit. She is 8 week postpartum following a spontaneous vaginal delivery. I have fully reviewed the prenatal and intrapartum course. The delivery was at full term gestational weeks. Outcome: spontaneous vaginal delivery. Anesthesia: none. Postpartum course has been complicated by pelvic pain. Baby's course has been normal. Baby is feeding by bottle. Bleeding no bleeding. Bowel function is normal. Bladder function is notible for frequency. Patient is sexually active. Contraception method is requests Mirena. Postpartum depression screening: negative.  The following portions of the patient's history were reviewed and updated as appropriate: allergies, current medications, past family history, past medical history, past social history, past surgical history and problem list.  Review of Systems Pertinent items are noted in HPI.   Objective:    BP 106/71  Pulse 87  Temp(Src) 96.7 F (35.9 C) (Oral)  Ht 5\' 1"  (1.549 m)  Wt 173 lb 4.8 oz (78.608 kg)  BMI 32.76 kg/m2  LMP 09/17/2013  Breastfeeding? No  General:  alert, cooperative and no distress   Breasts:     Lungs:    Heart:     Abdomen: soft, non-tender; bowel sounds normal; no masses,  no organomegaly   Vulva:  normal  Vagina: normal vagina  Cervix:  no lesions  Corpus: normal  Adnexa:  normal adnexa  Rectal Exam: Not performed.        Assessment:     normal  postpartum exam. Pap smear not done at today's visit.   Plan:    1. Contraception: IUD 2. Request MIrena 3. Follow up in: 2 weeks or as needed.   Adam Phenix, MD 10/03/2013

## 2013-10-04 ENCOUNTER — Ambulatory Visit: Payer: No Typology Code available for payment source

## 2013-10-04 LAB — URINE CULTURE

## 2013-10-07 ENCOUNTER — Encounter: Payer: Self-pay | Admitting: Internal Medicine

## 2013-10-07 ENCOUNTER — Ambulatory Visit: Payer: No Typology Code available for payment source | Attending: Internal Medicine | Admitting: Internal Medicine

## 2013-10-07 VITALS — BP 110/77 | HR 82 | Temp 97.7°F | Resp 17 | Ht 62.0 in | Wt 174.8 lb

## 2013-10-07 DIAGNOSIS — D2370 Other benign neoplasm of skin of unspecified lower limb, including hip: Secondary | ICD-10-CM | POA: Insufficient documentation

## 2013-10-07 DIAGNOSIS — E669 Obesity, unspecified: Secondary | ICD-10-CM

## 2013-10-07 DIAGNOSIS — K029 Dental caries, unspecified: Secondary | ICD-10-CM | POA: Insufficient documentation

## 2013-10-07 DIAGNOSIS — D229 Melanocytic nevi, unspecified: Secondary | ICD-10-CM | POA: Insufficient documentation

## 2013-10-07 DIAGNOSIS — D239 Other benign neoplasm of skin, unspecified: Secondary | ICD-10-CM

## 2013-10-07 DIAGNOSIS — Z139 Encounter for screening, unspecified: Secondary | ICD-10-CM

## 2013-10-07 LAB — COMPLETE METABOLIC PANEL WITH GFR
ALT: 76 U/L — ABNORMAL HIGH (ref 0–35)
AST: 29 U/L (ref 0–37)
Albumin: 4.8 g/dL (ref 3.5–5.2)
CO2: 25 mEq/L (ref 19–32)
Calcium: 9.6 mg/dL (ref 8.4–10.5)
Chloride: 102 mEq/L (ref 96–112)
Creat: 0.47 mg/dL — ABNORMAL LOW (ref 0.50–1.10)
GFR, Est African American: >89 mL/min
Potassium: 4.3 mEq/L (ref 3.5–5.3)
Sodium: 141 mEq/L (ref 135–145)
Total Protein: 7.5 g/dL (ref 6.0–8.3)

## 2013-10-07 LAB — LIPID PANEL
LDL Cholesterol: 101 mg/dL — ABNORMAL HIGH (ref 0–99)
Triglycerides: 59 mg/dL (ref <150)
VLDL: 12 mg/dL (ref 0–40)

## 2013-10-07 LAB — CBC WITH DIFFERENTIAL/PLATELET
Basophils Absolute: 0 10*3/uL (ref 0.0–0.1)
Lymphocytes Relative: 40 % (ref 12–46)
Lymphs Abs: 2.3 10*3/uL (ref 0.7–4.0)
MCV: 81.3 fL (ref 78.0–100.0)
Neutro Abs: 3 10*3/uL (ref 1.7–7.7)
Neutrophils Relative %: 52 % (ref 43–77)
Platelets: 213 10*3/uL (ref 150–400)
RBC: 4.18 MIL/uL (ref 3.87–5.11)
RDW: 16.3 % — ABNORMAL HIGH (ref 11.5–15.5)
WBC: 5.7 10*3/uL (ref 4.0–10.5)

## 2013-10-07 LAB — TSH: TSH: 1.81 u[IU]/mL (ref 0.350–4.500)

## 2013-10-07 NOTE — Progress Notes (Signed)
Patient ID: Debra Allen, female   DOB: Mar 29, 1986, 27 y.o.   MRN: [id number] MRN: [id number] Name: Debra Allen  Sex: female Age: 27 y.o. DOB: 1986/07/27  Allergies: Keflex  Chief Complaint  Patient presents with   new patient    HPI: Patient is 27 y.o. female who comes today for [place] to establish medical care, she reported noticed a mole on her left leg which is now been increasing in the size and had some tenderness. Patient is also requesting to see a dentist had some history of dental caries, patient also wants to lose weight and is requesting to see a dietitian. She denies any headache dizziness shortness of breath.   Past Medical History  Diagnosis Date   Headache(784.0)    Urinary tract infection     Past Surgical History  Procedure Laterality Date   No past surgeries        Medication List    Notice As of 10/07/2013 11:10 AM   You have not been prescribed any medications.      No orders of the defined types were placed in this encounter.    Immunization History  Administered Date(s) Administered   Influenza Split 09/07/2011, 12/25/2012   Tdap 06/11/2013    History  Substance Use Topics   Smoking status: Never Smoker    Smokeless tobacco: Never Used   Alcohol Use: No    Review of Systems  As noted in HPI  Filed Vitals:   10/07/13 1011  BP: 110/77  Pulse: 82  Temp: 97.7 F (36.5 C)  Resp: 17    Physical Exam  Physical Exam  Constitutional: No distress.  Eyes: EOM are normal. Pupils are equal, round, and reactive to light.  Cardiovascular: Normal rate and regular rhythm.   Pulmonary/Chest: Breath sounds normal. No respiratory distress. She has no wheezes. She has no rales.  Musculoskeletal:  Black Mole on left leg     CBC    Component Value Date/Time   WBC 8.6 07/14/2013 0615   RBC 3.63* 07/14/2013 0615   HGB 9.5* 07/14/2013 0615   HCT 29.5* 07/14/2013 0615   PLT 158 07/14/2013 0615   MCV 81.3 07/14/2013 0615   LYMPHSABS 1.6 12/25/2012 1047   MONOABS 0.3 12/25/2012 1047   EOSABS 0.1 12/25/2012 1047   BASOSABS 0.0 12/25/2012 1047    CMP     Component Value Date/Time   NA 138 08/13/2012 1630   K 3.6 08/13/2012 1630   CL 101 08/13/2012 1630   CO2 25 08/13/2012 1630   GLUCOSE 108* 08/13/2012 1630   BUN 10 08/13/2012 1630   CREATININE 0.49* 08/13/2012 1630   CALCIUM 9.9 08/13/2012 1630   PROT 7.8 08/13/2012 1630   ALBUMIN 3.9 08/13/2012 1630   AST 18 08/13/2012 1630   ALT 36* 08/13/2012 1630   ALKPHOS 75 08/13/2012 1630   BILITOT 0.6 08/13/2012 1630   GFRNONAA >90 08/13/2012 1630   GFRAA >90 08/13/2012 1630    No results found for this basename: chol,  tri,  ldl    No components found with this basename: hga1c    Lab Results  Component Value Date/Time   AST 18 08/13/2012  4:30 PM    Assessment and Plan  Mole of skin - Plan: Ambulatory referral to Dermatology for further evaluation and management  Dental caries - Plan: Ambulatory referral to Dentistry.  Screening - Plan: CBC with Differential, COMPLETE METABOLIC PANEL WITH GFR, Lipid panel, TSH, Vit D  25 hydroxy (rtn osteoporosis monitoring)  Obesity (BMI 30-39.9) - Plan: Amb ref to Medical Nutrition Therapy-MNT  Burtis Junes Will follow with her gynecologist.  Return in about 6 weeks (around 11/18/2013).  Doris Cheadle, MD

## 2013-10-07 NOTE — Progress Notes (Signed)
Patient here to establish care Complains of  Mole on her left lower leg Bothersome- has some localized pain around the area Would like to start birth control Needs referrals to dentist Eye doctor and nutritionist

## 2013-10-28 ENCOUNTER — Other Ambulatory Visit: Payer: Self-pay | Admitting: Emergency Medicine

## 2013-10-28 ENCOUNTER — Telehealth: Payer: Self-pay | Admitting: Emergency Medicine

## 2013-10-28 MED ORDER — VITAMIN D (ERGOCALCIFEROL) 1.25 MG (50000 UNIT) PO CAPS: 50000.0000 [IU] | ORAL_CAPSULE | ORAL | Status: DC

## 2013-10-28 NOTE — Telephone Encounter (Signed)
Pt called in requesting lab results from 10/08/13 Vitamin D replaced with script Ergocalciferol 50,000 units x 12 weeks with repeat Pt verbalized understanding

## 2013-11-18 ENCOUNTER — Ambulatory Visit: Payer: Self-pay | Admitting: Internal Medicine

## 2013-12-10 ENCOUNTER — Encounter: Payer: Self-pay | Admitting: Dietician

## 2013-12-10 ENCOUNTER — Encounter: Payer: Self-pay | Attending: Internal Medicine | Admitting: Dietician

## 2013-12-10 DIAGNOSIS — Z713 Dietary counseling and surveillance: Secondary | ICD-10-CM | POA: Insufficient documentation

## 2013-12-10 DIAGNOSIS — R635 Abnormal weight gain: Secondary | ICD-10-CM | POA: Insufficient documentation

## 2013-12-10 NOTE — Patient Instructions (Addendum)
Goals:   Follow "Plate Method" for portion control; fill 1/2 plate with vegetables  Limit carbohydrate 1-2 servings/meal  Choose more whole grains, lean protein, low-fat dairy, and fruits/non-starchy vegetables.   Continue for >30 min of physical activity daily  Limit sugar-sweetened beverages and concentrated sweets  Focus on eating slowly

## 2013-12-10 NOTE — Progress Notes (Signed)
  Medical Nutrition Therapy:  Appt start time: 7782 end time:  1230.   Assessment:  Debra Allen is here today with concerns about her weight. She reports that her weight is increasing quickly even though she states that she doesn't eat much. She states that she was 120 pounds prior to her first pregnancy several years ago and 138 lbs prior to her most recent pregnancy. She is 5 months postpartum. Debra Allen reports that her weight increased to 190 pounds during this pregnancy. Today she weighs 170.8 pounds. She states that she weighed 154 lbs 2 weeks ago at the doctor and is confused by the fluctuation. She is unemployed. Her family rarely eats outside of the home (twice a month). When eating at home, the family eats together at the table with no distractions. Per patient report, she eats a wide variety of fresh foods. However, Debra Allen may eat several starch servings at a meal. The family has tortillas with every meal, sometimes in addition to rice and/or beans. She does not snack on typical "junk foods" like candy, pizza, chips, or cookies. She reports that she eats quickly.  Preferred Learning Style:   No preference indicated   Learning Readiness:  Ready  MEDICATIONS: none   DIETARY INTAKE:  24-hr recall:  B (7 AM): water  Snk (9-10 AM): sometimes coffee or juice and cereal  L (11-12 PM): tortilla, beans, chicken with vegetables Snk (4 PM): fruit or yogurt D (5-6 PM): chicken, salad or vegetables, rice or beans, always tortilla Snk ( PM): none Beverages: apple juice, orange juice, water  Usual physical activity: 30-60 minutes of running/cardio every day  Estimated energy needs: 2000 calories   Progress Towards Goal(s):  In progress.   Nutritional Diagnosis:  Cloud-3.4 Unintentional weight gain As related to pregnancy and excessive carbohydrate intake.  As evidenced by BMI 31 and pt reports rapid weight gain.    Intervention:  Nutrition education provided.  Teaching Method  Utilized: Visual Auditory Hands on  Handouts given during visit include:  MyPlate (Spanish)  Barriers to learning/adherence to lifestyle change: none  Demonstrated degree of understanding via:  Teach Back   Monitoring/Evaluation:  Dietary intake, exercise, and body weight in 2 month(s)

## 2014-02-05 ENCOUNTER — Other Ambulatory Visit: Payer: Self-pay

## 2014-02-05 ENCOUNTER — Inpatient Hospital Stay (HOSPITAL_COMMUNITY)
Admission: AD | Admit: 2014-02-05 | Discharge: 2014-02-05 | Disposition: A | Discharge status: Home / Self Care | Payer: Self-pay | Source: Ambulatory Visit | Attending: Obstetrics & Gynecology | Admitting: Obstetrics & Gynecology

## 2014-02-05 ENCOUNTER — Encounter (HOSPITAL_COMMUNITY): Payer: Self-pay | Admitting: *Deleted

## 2014-02-05 ENCOUNTER — Inpatient Hospital Stay (HOSPITAL_COMMUNITY): Payer: Self-pay

## 2014-02-05 DIAGNOSIS — O209 Hemorrhage in early pregnancy, unspecified: Secondary | ICD-10-CM

## 2014-02-05 DIAGNOSIS — O9989 Other specified diseases and conditions complicating pregnancy, childbirth and the puerperium: Principal | ICD-10-CM

## 2014-02-05 DIAGNOSIS — R1032 Left lower quadrant pain: Secondary | ICD-10-CM | POA: Insufficient documentation

## 2014-02-05 DIAGNOSIS — R197 Diarrhea, unspecified: Secondary | ICD-10-CM | POA: Insufficient documentation

## 2014-02-05 DIAGNOSIS — O99891 Other specified diseases and conditions complicating pregnancy: Secondary | ICD-10-CM | POA: Insufficient documentation

## 2014-02-05 LAB — URINE MICROSCOPIC-ADD ON

## 2014-02-05 LAB — URINALYSIS, ROUTINE W REFLEX MICROSCOPIC
Bilirubin Urine: NEGATIVE
GLUCOSE, UA: NEGATIVE mg/dL
Ketones, ur: NEGATIVE mg/dL
Leukocytes, UA: NEGATIVE
Nitrite: NEGATIVE
Protein, ur: NEGATIVE mg/dL
Urobilinogen, UA: 0.2 mg/dL (ref 0.0–1.0)
pH: 5.5 (ref 5.0–8.0)

## 2014-02-05 LAB — POCT PREGNANCY, URINE: Preg Test, Ur: POSITIVE — AB

## 2014-02-05 LAB — CBC
HEMATOCRIT: 35.4 % — AB (ref 36.0–46.0)
HEMOGLOBIN: 11.8 g/dL — AB (ref 12.0–15.0)
MCH: 28.3 pg (ref 26.0–34.0)
MCHC: 33.3 g/dL (ref 30.0–36.0)
MCV: 84.9 fL (ref 78.0–100.0)
Platelets: 166 10*3/uL (ref 150–400)
RBC: 4.17 MIL/uL (ref 3.87–5.11)
RDW: 14.2 % (ref 11.5–15.5)
WBC: 5.5 10*3/uL (ref 4.0–10.5)

## 2014-02-05 LAB — WET PREP, GENITAL
CLUE CELLS WET PREP: NONE SEEN
TRICH WET PREP: NONE SEEN
Yeast Wet Prep HPF POC: NONE SEEN

## 2014-02-05 LAB — HCG, QUANTITATIVE, PREGNANCY: hCG, Beta Chain, Quant, S: 7542 m[IU]/mL — ABNORMAL HIGH (ref <5)

## 2014-02-05 NOTE — Discharge Instructions (Signed)
Hemorragia vaginal durante el embarazo  (Vaginal Bleeding During Pregnancy)  En cualquier momento del embarazo podr perder una pequea cantidad de sangre por la vagina. Esta situacin generalmente mejora por s misma. Sin embargo, algunos sangrados pueden ser graves. Asegrese de informar a su mdico si tiene un sangrado vaginal. CUIDADOS EN EL HOGAR   Descanse y duerma lo suficiente.  Permanezca en la cama y slo levntese para ir al bao segn las indicaciones del mdico.  Anote cuntos apsitos [place] [employer] forces operational officer. Describa el grado en que estn empapados.  No use tampones. No higienice la vagina con un chorro de agua (ducha).  No tenga sexo (relaciones sexuales) ni coloque nada dentro de la vagina. Pida la aprobacin de su mdico.  Si elimina tejidos por la vagina, gurdelos. Mustreselos a su mdico.  Slo tome los medicamentos que le indique el mdico.  Siga los consejos del mdico acerca de levantar objetos pesados, conducir automviles y Optometrist actividad fsica. SOLICITE AYUDA DE INMEDIATO SI:   Siente que el beb se mueve menos, o no se mueve.  Se desvanece (se desmaya) al ir al bao.  Aumenta el sangrado.  Comienza a Field seismologist.  Siente clicos intensos en el estmago, en la espalda o en el vientre (abdomen).  Tiene una prdida importante o sale lquido a borbotones por la vagina.  Se siente mareada o dbil.  Tiene escalofros.  Elimina grumos de tejido o cogulos de sangre por la vagina.  Tiene fiebre. ASEGRESE DE QUE:   Comprende estas instrucciones.  Controlar su enfermedad.  Solicitar ayuda de inmediato si no mejora o si empeora. Document Released: 10/16/2012 [facility] Patient Information 2014 [place], [place].  Reposo plvico  (Pelvic Rest) El reposo plvico se recomienda a las mujeres cuando:   La placenta cubre parcial o completamente la abertura del cuello del tero (placenta previa).  Hay sangrado entre la pared del tero y el saco  amnitico en el primer trimestre (hemorragia subcorinica).  El cuello uterino comienza a abrirse sin iniciarse el trabajo de parto (cuello uterino incompetente, insuficiencia cervical).  El [place] de parto se inicia muy pronto (parto prematuro). INSTRUCCIONES PARA EL CUIDADO EN EL HOGAR   No tenga relaciones sexuales, estimulacin, ni orgasmos.  No use tampones, no se haga duchas vaginales ni coloque ningn objeto en la vagina.  No levante objetos que pesen ms de 10 libras (4,5 kg).  Evite las actividades extenuantes o tensionar los msculos de la pelvis. SOLICITE ATENCIN MDICA SI:   Tiene un sangrado vaginal durante el embarazo. Considrelo como una posible emergencia.  Siente clicos en la zona baja del estmago (ms fuertes que los clicos menstruales).  Nota flujo vaginal (acuoso, con moco o [place]).  Siente un dolor en la espalda leve y sordo.  Tiene contracciones regulares o endurecimiento del tero. SOLICITE ATENCIN MDICA DE INMEDIATO SI:  Observa sangrado vaginal y tiene placenta previa.  Document Released: 07/24/2012 [facility] Patient Information 2014 [place], [place].

## 2014-02-05 NOTE — MAU Provider Note (Signed)
History     CSN: [id number]  Arrival date and time: 02/05/14 1019   First Provider Initiated Contact with Patient 02/05/14 1105      Chief Complaint  Patient presents with  . Abdominal Pain  . Vaginal Bleeding  . Diarrhea  . Dizziness   HPI This is a 28 y.o. female at [redacted]w[redacted]d who presents with c/o LLQ pain x 2 days with one episode of bleeding two days ago. She points to bilateral mid abdomen, however.  Has had diarrhea and chills.   RN Note:  Patient presents to MAU with c/o lower left abdomina pain that started 2 days ago. Did have an episode of bleeding 2 days ago that was dark red and spotting. Denies any bleeding now. Last LMP 12/31/13. Had a +HPT. Reports diarrhea x 1 week, dizziness and chills.        OB History   Grav Para Term Preterm Abortions TAB SAB Ect Mult Living   4 3 3       3       Past Medical History  Diagnosis Date  . Headache(784.0)   . Urinary tract infection     Past Surgical History  Procedure Laterality Date  . No past surgeries      Family History  Problem Relation Age of Onset  . Other Neg Hx   . Diabetes Mother   . Hypertension Maternal Grandmother   . Diabetes Maternal Grandmother   . Cancer Paternal Grandmother   . Cancer Paternal Grandfather   . Hypertension Father   . Cancer Paternal Aunt     bladder cancer   . Asthma Other   . Obesity Other   . Sleep apnea Other   . Heart disease Other     History  Substance Use Topics  . Smoking status: Never Smoker   . Smokeless tobacco: Never Used  . Alcohol Use: No    Allergies:  Allergies  Allergen Reactions  . Keflex [Cephalexin] Itching and Rash    No prescriptions prior to admission    Review of Systems  Constitutional: Negative for fever, chills and malaise/fatigue.  Gastrointestinal: Positive for abdominal pain (points to mid abdomen bilaterally) and diarrhea. Negative for nausea, vomiting and constipation.  Genitourinary: Negative for dysuria.  Neurological:  Negative for dizziness and headaches.   Physical Exam   Blood pressure 104/66, pulse 78, temperature 98.4 F (36.9 C), temperature source Oral, resp. rate 16, height 5\' 3"  (1.6 m), last menstrual period 12/31/2013, SpO2 100.00%.  Physical Exam  Constitutional: She is oriented to person, place, and time. She appears well-developed and well-nourished. No distress.  Cardiovascular: Normal rate.   Respiratory: Effort normal.  GI: Soft.  Genitourinary: Vagina normal and uterus normal. No vaginal discharge (white normal discharge, no blood, cervix long/closed, uterus nontender) found.  Musculoskeletal: Normal range of motion.  Neurological: She is alert and oriented to person, place, and time.  Skin: Skin is warm and dry.  Psychiatric: She has a normal mood and affect.    MAU Course  Procedures  MDM Quant and CBC done. Cultures done. [place] ordered Results for orders placed during the hospital encounter of 02/05/14 (from the past 24 hour(s))  URINALYSIS, ROUTINE W REFLEX MICROSCOPIC     Status: Abnormal   Collection Time    02/05/14 10:25 AM      Result Value Ref Range   Color, Urine YELLOW  YELLOW   APPearance CLEAR  CLEAR   Specific Gravity, Urine >1.030 (*) 1.005 - 1.030  pH 5.5  5.0 - 8.0   Glucose, UA NEGATIVE  NEGATIVE mg/dL   Hgb urine dipstick TRACE (*) NEGATIVE   Bilirubin Urine NEGATIVE  NEGATIVE   Ketones, ur NEGATIVE  NEGATIVE mg/dL   Protein, ur NEGATIVE  NEGATIVE mg/dL   Urobilinogen, UA 0.2  0.0 - 1.0 mg/dL   Nitrite NEGATIVE  NEGATIVE   Leukocytes, UA NEGATIVE  NEGATIVE  URINE MICROSCOPIC-ADD ON     Status: Abnormal   Collection Time    02/05/14 10:25 AM      Result Value Ref Range   Squamous Epithelial / LPF FEW (*) RARE   WBC, UA 0-2  <3 WBC/hpf   RBC / HPF 3-6  <3 RBC/hpf   Urine-Other MUCOUS PRESENT    POCT PREGNANCY, URINE     Status: Abnormal   Collection Time    02/05/14 10:37 AM      Result Value Ref Range   Preg Test, Ur POSITIVE (*) NEGATIVE  CBC      Status: Abnormal   Collection Time    02/05/14 10:53 AM      Result Value Ref Range   WBC 5.5  4.0 - 10.5 K/uL   RBC 4.17  3.87 - 5.11 MIL/uL   Hemoglobin 11.8 (*) 12.0 - 15.0 g/dL   HCT 35.4 (*) 36.0 - 46.0 %   MCV 84.9  78.0 - 100.0 fL   MCH 28.3  26.0 - 34.0 pg   MCHC 33.3  30.0 - 36.0 g/dL   RDW 14.2  11.5 - 15.5 %   Platelets 166  150 - 400 K/uL  HCG, QUANTITATIVE, PREGNANCY     Status: Abnormal   Collection Time    02/05/14 10:53 AM      Result Value Ref Range   hCG, Beta Chain, Quant, S 7542 (*) <5 mIU/mL  WET PREP, GENITAL     Status: Abnormal   Collection Time    02/05/14 11:10 AM      Result Value Ref Range   Yeast Wet Prep HPF POC NONE SEEN  NONE SEEN   Trich, Wet Prep NONE SEEN  NONE SEEN   Clue Cells Wet Prep HPF POC NONE SEEN  NONE SEEN   WBC, Wet Prep HPF POC TOO NUMEROUS TO COUNT (*) NONE SEEN   US Ob Transvaginal  02/05/2014   CLINICAL DATA:  Rule out ectopic. Vaginal bleeding. Quantitative beta HCG is 7,542. By LMP, patient is 5 weeks 1 day. EDC by LMP would be 10/07/2014.  EXAM: TRANSVAGINAL OB ULTRASOUND; OBSTETRIC <14 WK ULTRASOUND  TECHNIQUE: Transvaginal ultrasound was performed for complete evaluation of the gestation as well as the maternal uterus, adnexal regions, and pelvic cul-de-sac.  COMPARISON:  None applicable  FINDINGS: Intrauterine gestational sac: Visualized/normal in shape.  Yolk sac:  None  Embryo:  None  Cardiac Activity: None  MSD: 9.2  mm   5 w   4  d  Maternal uterus/adnexae: Right corpus luteum cyst is present. Subchorionic hemorrhage is noted, measuring 2.5 x 1.0 x 2.2 cm.    IMPRESSION: 1. Intrauterine gestational sac without presence of yolk sac or embryo at this time. 2. Note is made of a subchorionic hemorrhage. 3. Follow-up ultrasound is recommended in 14 days to document presence of fetal pole and for dating purposes.     Electronically Signed   By: Shon Hale M.D.   On: 02/05/2014 15:03   Assessment and Plan  A:  Intrauterine  gestational sac at 5.4 wks, no  yolk sac     P:  DIscussed inconclusive findings yet       Plan to repeat Quant in 2 days then [place] next week       Ectopic precautions  [facility] 02/05/2014, 12:19 PM

## 2014-02-05 NOTE — MAU Note (Signed)
Patient presents to MAU with c/o lower left abdomina pain that started 2 days ago. Did have an episode of bleeding 2 days ago that was dark red and spotting. Denies any bleeding now. Last LMP 12/31/13. Had a +HPT. Reports diarrhea x 1 week, dizziness and chills.

## 2014-02-06 LAB — GC/CHLAMYDIA PROBE AMP
CT Probe RNA: NEGATIVE
GC Probe RNA: NEGATIVE

## 2014-02-06 NOTE — MAU Provider Note (Signed)
Attestation of Attending Supervision of Advanced Practitioner (CNM/NP): Evaluation and management procedures were performed by the Advanced Practitioner under my supervision and collaboration.  I have reviewed the Advanced Practitioner's note and chart, and I agree with the management and plan.  HARRAWAY-SMITH, Lanae Federer 11:06 AM

## 2014-02-07 ENCOUNTER — Inpatient Hospital Stay (HOSPITAL_COMMUNITY)
Admission: AD | Admit: 2014-02-07 | Discharge: 2014-02-08 | Disposition: A | Discharge status: Home / Self Care | Payer: Self-pay | Source: Ambulatory Visit | Attending: Obstetrics & Gynecology | Admitting: Obstetrics & Gynecology

## 2014-02-07 DIAGNOSIS — O200 Threatened abortion: Secondary | ICD-10-CM

## 2014-02-07 LAB — HCG, QUANTITATIVE, PREGNANCY: hCG, Beta Chain, Quant, S: 9004 m[IU]/mL — ABNORMAL HIGH (ref <5)

## 2014-02-07 NOTE — MAU Note (Signed)
SAYS  SHE HAS PAIN - SAME AS ON 3-26-  ON RIGHT LOWER ABD.    NO BLEEDING.  TOLD TO RETURN  FOR  LABS.

## 2014-02-08 MED ORDER — ACETAMINOPHEN-CODEINE #3 300-30 MG PO TABS: 1.0000 | ORAL_TABLET | Freq: Four times a day (QID) | ORAL | Status: DC | PRN

## 2014-02-08 MED ORDER — PROMETHAZINE HCL 25 MG PO TABS: 25.0000 mg | ORAL_TABLET | Freq: Four times a day (QID) | ORAL | Status: DC | PRN

## 2014-02-08 NOTE — MAU Provider Note (Signed)
  History     CSN: [id number]  Arrival date and time: 02/07/14 2242   None     No chief complaint on file.  HPI Comments: Debra Allen 28 y.o. [id number] presents to MAU for follow up on BHCG. She had pain and bleeding on 3/26 and her ultrasound showed IUG with no yolk sac and her BHCG was 7,542. Since then she has had a bloated feeling and continued with same levels of pain. She has taken OTC meds with no help. She has a scheduled repeat ultrasound next week.       Past Medical History  Diagnosis Date  . Headache(784.0)   . Urinary tract infection     Past Surgical History  Procedure Laterality Date  . No past surgeries      Family History  Problem Relation Age of Onset  . Other Neg Hx   . Diabetes Mother   . Hypertension Maternal Grandmother   . Diabetes Maternal Grandmother   . Cancer Paternal Grandmother   . Cancer Paternal Grandfather   . Hypertension Father   . Cancer Paternal Aunt     bladder cancer   . Asthma Other   . Obesity Other   . Sleep apnea Other   . Heart disease Other     History  Substance Use Topics  . Smoking status: Never Smoker   . Smokeless tobacco: Never Used  . Alcohol Use: No    Allergies:  Allergies  Allergen Reactions  . Keflex [Cephalexin] Itching and Rash    No prescriptions prior to admission    Review of Systems  Gastrointestinal: Positive for abdominal pain.   Physical Exam   Last menstrual period 12/31/2013.  Physical Exam  Constitutional: She is oriented to person, place, and time. She appears well-developed and well-nourished. No distress.  HENT:  Head: Normocephalic and atraumatic.  Eyes: Pupils are equal, round, and reactive to light.  Neck: Normal range of motion.  Musculoskeletal: Normal range of motion.  Neurological: She is alert and oriented to person, place, and time. She has normal reflexes.  Skin: Skin is warm and dry.  Psychiatric: She has a normal mood and affect. Her behavior is  normal. Judgment and thought content normal.   Results for orders placed during the hospital encounter of 02/07/14 (from the past 24 hour(s))  HCG, QUANTITATIVE, PREGNANCY     Status: Abnormal   Collection Time    02/07/14 11:01 PM      Result Value Ref Range   hCG, Beta Chain, Quant, S 9004 (*) <5 mIU/mL    MAU Course  Procedures  MDM  Repeat BHCG  Assessment and Plan   A: Threatened miscarriage  P: Tylenol # 3 and phenergan 25 mg po for pain and nausea Repeat BHCG in 48 hours Keep follow up U/S appointment Return to MAU if sx worsen  Georgia Duff 02/08/2014, 12:21 AM

## 2014-02-08 NOTE — Discharge Instructions (Signed)
Amenaza de aborto  (Threatened Miscarriage)  La hemorragia en las primeras 20 semanas de embarazo es algo frecuente. Se denomina amenaza de aborto Es un problema del embarazo que ocurre antes de la vigésima semana y que sugiere la probabilidad de que ocurra un aborto espontáneo. Generalmente esta hemorragia se detiene con reposo o con disminución de las actividades, según le ha sugerido el profesional que la asiste, y el embarazo continúa sin mayores problemas. Le indicarán que no mantenga relaciones sexuales, no tenga orgasmos ni use tampones hasta que la autoricen. En algunos casos la amenaza de aborto progresará hasta el aborto completo o incompleto. En algunos casos puede ser necesario un tratamiento adicional, en otros casos no. Algunos abortos ocurren antes que la mujer note que no ha menstruado y de que sepa que está embarazada.  Un aborto ocurre en el 15% al 20% de todos los embarazos y generalmente durante las primeras 13 semanas. En la mayoría de los casos se desconoce la causa exacta. Es una manera en que la naturaliza pone fin a un embarazo anormal o que no llegará a término. Algunas de las cosas que ponen en riesgo el embarazo son:  · Los cambios hormonales.  · Infección o tumores en el útero.  · Enfermedades crónicas, por ejemplo la diabetes, especialmente si no se ha controlado.  · Forma anormal del útero.  · Fibromas en el útero  · Cérvix incompetente (es demasiado débil para contener al bebé)  · El consumo de cigarrillos.  · Beber alcohol en exceso. Lo mejor es abstenerse de beber alcohol durante el embarazo.  · El consumo de drogas  TRATAMIENTO  No es necesario realizar un tratamiento adicional cuando el aborto es completo y todos los componentes de la concepción (todos los tejidos) se han eliminado. Si ha eliminado tejidos, consérvelos en un recipiente y llévelos al médico para que los evalúe. Si el aborto es incompleto (partes del feto o de la placenta quedan adentro) será necesario un  tratamiento adicional. La razón más frecuente para realizar un tratamiento es el sangrado (hemorragia) continuo debido a que los tejidos no se han eliminado completamente. Esto sucede cuando el aborto es incompleto. También puede suceder que los tejidos que no se han expulsado se infecten. El tratamiento consiste en la dilatación y curetaje (remoción de los productos del embarazo que pudieran quedar en el útero). Se realizará simplemente por medio de la succión (curetaje por succión) o por un raspado simple del interior del útero. Podrá llevarse a cabo en el hospital o en el consultorio del profesional. Sólo se lleva a cabo cuando el profesional se asegura que no hay posibilidades de que el embarazo llegue a término. Esto se determina por medio del examen físico, la prueba de embarazo negativa, el recuento hormonal y el ultrasonido que confirme la muerte del feto.  El aborto generalmente es una situación emocionalmente difícil para los padres. No es por su culpa ni la de su pareja. No ocurre por conductas inapropiadas por parte suya o de su compañero. Casi todos los abortos se producen porque el embarazo ha comenzado de un modo incorrecto. Al menos la mitad de estos embarazos presenta anormalidades cromosómicas. Casi nunca se trata de un trastorno congénito. En otros puede haber problemas de desarrollo en el feto o en la placenta. Esto no siempre se revela, aún cuando se estudien los productos del aborto con el microscopio. No se sienta culpable y probablemente no podría haber evitado que esto ocurra. Si tiene problemas emocionales debido a   este problema, convérselo con el profesional y solicite ayuda psicológico antes de un nuevo embarazo. Casi siempre puede tratar de embarazarse nuevamente tan pronto el profesional la autorice.  INSTRUCCIONES PARA EL CUIDADO DOMICILIARIO  · El profesional le indicará reposo, según la importancia de la hemorragia y los dolores que presente. Probablemente sólo la autorice a  levantarse para ir al baño. También podrá autorizarla a realizar una actividad ligera. En este momento usted necesitará hacer algunos arreglos para que otra persona se ocupe del cuidado de los niños y de otras responsabilidades adicionales.  · Lleve un registro de la cantidad y la saturación de las toallas higiénicas que utiliza cada día. Anote esta información.  · NO USE TAMPONES. No se haga duchas vaginales ni tenga relaciones sexuales u orgasmos hasta que el médico la autorice.  · Puede ser que le indiquen una cita para un seguimiento en el que volverán a evaluar el estado de su embarazo y le repetirán las pruebas de sangre. Concurra para una nueva evaluación dentro de 2 días y nuevamente dentro de 4 a 6 semanas. Es muy importante que realice el seguimiento en el momento que le han indicado.  · Si su grupo sanguíneo es Rh negativo y el del padre es Rh positivo, o si no conoce el grupo sanguíneo del padre, le indicarán una inyección (inmunoglobulina Rh) para prevenir los anticuerpos anormales que pueden desarrollarse y afectar al bebé en futuros embarazos.  SOLICITE ATENCIÓN MÉDICA DE INMEDIATO SI:  · Siente calambres intensos en el estómago, en la espalda o en el abdomen.  · Siente un dolor intenso de comienzo súbito en la zona inferior del abdomen.  · Comienza a sentir escalofríos.  · La temperatura se eleva por encima de 101° F (38.3° C).  · Elimina coágulos o tejidos grandes. Guarde una muestra de esos tejidos para que el profesional lo inspeccione.  · La hemorragia aumenta o se siente mareada, débil o tiene episodios de desmayos.  · Tiene una pérdida de líquido por la vagina.  · Se desmaya. No puede mover el intestino. Podría tratarse de un embarazo ectópico.  Document Released: 08/09/2005 Document Revised: 01/22/2012  [employer]® Patient Information ©2014 [employer].

## 2014-02-09 ENCOUNTER — Inpatient Hospital Stay (HOSPITAL_COMMUNITY)
Admission: AD | Admit: 2014-02-09 | Discharge: 2014-02-09 | Disposition: A | Discharge status: Home / Self Care | Payer: Self-pay | Source: Ambulatory Visit | Attending: Obstetrics & Gynecology | Admitting: Obstetrics & Gynecology

## 2014-02-09 ENCOUNTER — Inpatient Hospital Stay (HOSPITAL_COMMUNITY): Payer: Self-pay

## 2014-02-09 ENCOUNTER — Ambulatory Visit: Payer: Self-pay | Admitting: Dietician

## 2014-02-09 DIAGNOSIS — O9989 Other specified diseases and conditions complicating pregnancy, childbirth and the puerperium: Principal | ICD-10-CM

## 2014-02-09 DIAGNOSIS — R109 Unspecified abdominal pain: Secondary | ICD-10-CM

## 2014-02-09 DIAGNOSIS — O468X1 Other antepartum hemorrhage, first trimester: Secondary | ICD-10-CM

## 2014-02-09 DIAGNOSIS — O418X10 Other specified disorders of amniotic fluid and membranes, first trimester, not applicable or unspecified: Secondary | ICD-10-CM

## 2014-02-09 DIAGNOSIS — O208 Other hemorrhage in early pregnancy: Secondary | ICD-10-CM | POA: Insufficient documentation

## 2014-02-09 DIAGNOSIS — O26899 Other specified pregnancy related conditions, unspecified trimester: Secondary | ICD-10-CM

## 2014-02-09 DIAGNOSIS — O99891 Other specified diseases and conditions complicating pregnancy: Secondary | ICD-10-CM | POA: Insufficient documentation

## 2014-02-09 LAB — HCG, QUANTITATIVE, PREGNANCY: hCG, Beta Chain, Quant, S: 11348 m[IU]/mL — ABNORMAL HIGH (ref <5)

## 2014-02-09 NOTE — MAU Note (Signed)
Pt here for F/U BHCG today, has upper & lower abd pain, denies bleeding.

## 2014-02-09 NOTE — MAU Note (Signed)
Pt offered a room to lie down in, states she is fine to wait in the lobby.

## 2014-02-09 NOTE — MAU Provider Note (Signed)
Chief Complaint: Follow-up   None    SUBJECTIVE HPI: Debra Allen is a 28 y.o. [phone] at [redacted]w[redacted]d by LMP who presents to maternity admissions for follow up quant hcg.  She was seen 4 days ago for abdominal pain in pregnancy and had f/u quant 2 days ago which was rising but not by >60%.  She continues to report abdominal pain and bloating, rating this a 7/10 today.  She denies vaginal bleeding, vaginal itching/burning, urinary symptoms, h/a, dizziness, n/v, or fever/chills.     Past Medical History  Diagnosis Date  . Headache(784.0)   . Urinary tract infection    Past Surgical History  Procedure Laterality Date  . No past surgeries     History   Social History  . Marital Status: Single    Spouse Name: N/A    Number of Children: N/A  . Years of Education: N/A   Occupational History  . Not on file.   Social History Main Topics  . Smoking status: Never Smoker   . Smokeless tobacco: Never Used  . Alcohol Use: No  . Drug Use: No  . Sexual Activity: Yes    Birth Control/ Protection: None   Other Topics Concern  . Not on file   Social History Narrative  . No narrative on file   No current facility-administered medications on file prior to encounter.   Current Outpatient Prescriptions on File Prior to Encounter  Medication Sig Dispense Refill  . acetaminophen-codeine (TYLENOL #3) 300-30 MG per tablet Take 1-2 tablets by mouth every 6 (six) hours as needed for moderate pain.  15 tablet  0  . promethazine (PHENERGAN) 25 MG tablet Take 1 tablet (25 mg total) by mouth every 6 (six) hours as needed for nausea or vomiting.  30 tablet  0   Allergies  Allergen Reactions  . Keflex [Cephalexin] Itching and Rash    ROS: Pertinent items in HPI  OBJECTIVE Blood pressure 108/67, pulse 75, temperature 98.8 F (37.1 C), temperature source Oral, resp. rate 18, last menstrual period 12/31/2013. GENERAL: Well-developed, well-nourished female in no acute distress.  HEENT:  Normocephalic HEART: normal rate RESP: normal effort ABDOMEN: Soft, non-tender EXTREMITIES: Nontender, no edema NEURO: Alert and oriented SPECULUM EXAM: Deferred--done 3/26 in MAU  LAB RESULTS      Result Value Ref Range  HCG, QUANTITATIVE, PREGNANCY     Status: Abnormal   Collection Time    02/05/14 10:53 AM      Result Value Ref Range   hCG, Beta Chain, Quant, S 7542 (*) <5 mIU/mL  HCG, QUANTITATIVE, PREGNANCY     Status: Abnormal   Collection Time    02/07/14 11:01 PM      Result Value Ref Range   hCG, Beta Chain, Quant, S 9004 (*) <5 mIU/mL  HCG, QUANTITATIVE, PREGNANCY     Status: Abnormal   Collection Time    02/09/14  5:00 PM      Result Value Ref Range   hCG, Beta Chain, Quant, S 11348 (*) <5 mIU/mL    IMAGING   US Ob Transvaginal  02/09/2014   CLINICAL DATA:  Inappropriate elevation of quantitative beta HCG. Abdominal pain.  EXAM: TRANSVAGINAL OB ULTRASOUND  TECHNIQUE: Transvaginal ultrasound was performed for complete evaluation of the gestation as well as the maternal uterus, adnexal regions, and pelvic cul-de-sac.  COMPARISON:  Ultrasound dated 02/05/2014  FINDINGS: Intrauterine gestational sac: Visualized/normal in shape.  Yolk sac:  Yes  Embryo:  Yes  Cardiac Activity: Yes  Heart  Rate: 115 bpm  CRL:   2.5  mm   5 w 6 d                  [place] EDC: 10/06/2014  Maternal uterus/adnexae: There is a large subchorionic hemorrhage now measuring 5.0 x 2.9 x 1.7 cm, increased from 2.5 x 2.2 x 1.0 cm.  The right ovary is normal. Left ovary is not visualized. No free fluid.  IMPRESSION: 1. Intrauterine pregnancy of approximately 5 weeks [redacted] weeks gestation. 2. Increasing large subchorionic hemorrhage.   Electronically Signed   By: Rozetta Nunnery M.D.   On: 02/09/2014 19:47   US Ob Comp Less 14 Wks  02/05/2014   CLINICAL DATA:  Rule out ectopic. Vaginal bleeding. Quantitative beta HCG is 7,542. By LMP, patient is 5 weeks 1 day. EDC by LMP would be 10/07/2014.  EXAM: TRANSVAGINAL OB  ULTRASOUND; OBSTETRIC <14 WK ULTRASOUND  TECHNIQUE: Transvaginal ultrasound was performed for complete evaluation of the gestation as well as the maternal uterus, adnexal regions, and pelvic cul-de-sac.  COMPARISON:  None applicable  FINDINGS: Intrauterine gestational sac: Visualized/normal in shape.  Yolk sac:  None  Embryo:  None  Cardiac Activity: None  MSD: 9.2  mm   5 w   4  d  Maternal uterus/adnexae: Right corpus luteum cyst is present. Subchorionic hemorrhage is noted, measuring 2.5 x 1.0 x 2.2 cm.  IMPRESSION: 1. Intrauterine gestational sac without presence of yolk sac or embryo at this time. 2. Note is made of a subchorionic hemorrhage. 3. Follow-up ultrasound is recommended in 14 days to document presence of fetal pole and for dating purposes.   Electronically Signed   By: Shon Hale M.D.   On: 02/05/2014 15:03     ASSESSMENT 1. Abdominal pain complicating pregnancy   2. Subchorionic hemorrhage in first trimester     PLAN Discharge home with bleeding precautions Discussed [facility] and threatened miscarriage with pt Plan to begin prenatal care as soon as possible Return to MAU as needed for emergencies    Medication List         acetaminophen-codeine 300-30 MG per tablet  Commonly known as:  TYLENOL #3  Take 1-2 tablets by mouth every 6 (six) hours as needed for moderate pain.     promethazine 25 MG tablet  Commonly known as:  PHENERGAN  Take 1 tablet (25 mg total) by mouth every 6 (six) hours as needed for nausea or vomiting.         Fatima Blank Certified Nurse-Midwife 02/09/2014  8:12 PM

## 2014-02-09 NOTE — MAU Provider Note (Signed)
Attestation of Attending Supervision of Advanced Practitioner (PA/CNM/NP): Evaluation and management procedures were performed by the Advanced Practitioner under my supervision and collaboration.  I have reviewed the Advanced Practitioner's note and chart, and I agree with the management and plan.  Stephaney Steven, MD, FACOG Attending Obstetrician & Gynecologist Faculty Practice, [facility]  

## 2014-04-02 ENCOUNTER — Encounter (HOSPITAL_COMMUNITY): Payer: Self-pay | Admitting: *Deleted

## 2014-04-02 ENCOUNTER — Inpatient Hospital Stay (HOSPITAL_COMMUNITY)
Admission: AD | Admit: 2014-04-02 | Discharge: 2014-04-02 | Disposition: A | Discharge status: Home / Self Care | Payer: MEDICAID | Source: Ambulatory Visit | Attending: Obstetrics & Gynecology | Admitting: Obstetrics & Gynecology

## 2014-04-02 DIAGNOSIS — O210 Mild hyperemesis gravidarum: Secondary | ICD-10-CM | POA: Insufficient documentation

## 2014-04-02 DIAGNOSIS — M545 Low back pain, unspecified: Secondary | ICD-10-CM | POA: Insufficient documentation

## 2014-04-02 DIAGNOSIS — O219 Vomiting of pregnancy, unspecified: Secondary | ICD-10-CM

## 2014-04-02 DIAGNOSIS — M549 Dorsalgia, unspecified: Secondary | ICD-10-CM

## 2014-04-02 LAB — URINE MICROSCOPIC-ADD ON

## 2014-04-02 LAB — URINALYSIS, ROUTINE W REFLEX MICROSCOPIC
BILIRUBIN URINE: NEGATIVE
Glucose, UA: NEGATIVE mg/dL
Ketones, ur: 15 mg/dL — AB
Leukocytes, UA: NEGATIVE
NITRITE: NEGATIVE
PH: 6 (ref 5.0–8.0)
Protein, ur: NEGATIVE mg/dL
UROBILINOGEN UA: 0.2 mg/dL (ref 0.0–1.0)

## 2014-04-02 MED ORDER — CYCLOBENZAPRINE HCL 5 MG PO TABS: 5.0000 mg | ORAL_TABLET | Freq: Three times a day (TID) | ORAL | Status: DC | PRN

## 2014-04-02 MED ORDER — PROMETHAZINE HCL 25 MG/ML IJ SOLN
12.5000 mg | Freq: Once | INTRAMUSCULAR | Status: AC
  Administered 2014-04-02: 12.5 mg via INTRAVENOUS
  Filled 2014-04-02: qty 1

## 2014-04-02 MED ORDER — DEXTROSE IN LACTATED RINGERS 5 % IV SOLN
INTRAVENOUS | Status: DC
  Administered 2014-04-02: 13:00:00 via INTRAVENOUS

## 2014-04-02 MED ORDER — CYCLOBENZAPRINE HCL 5 MG PO TABS
5.0000 mg | ORAL_TABLET | Freq: Once | ORAL | Status: AC
  Administered 2014-04-02: 5 mg via ORAL
  Filled 2014-04-02: qty 1

## 2014-04-02 MED ORDER — PROMETHAZINE HCL 25 MG PO TABS: 25.0000 mg | ORAL_TABLET | Freq: Four times a day (QID) | ORAL | Status: DC | PRN

## 2014-04-02 NOTE — MAU Provider Note (Signed)
History     CSN: [id number]  Arrival date and time: 04/02/14 1004   First Provider Initiated Contact with Patient 04/02/14 1146      Chief Complaint  Patient presents with  . Abdominal Pain   HPI Comments: Debra Allen 28 y.o. [id number] presents to MAU today with pain in lower back and lower abdomen with nausea and vomiting since Sunday. Has worsened since onset. Back pain is described as cramping and worsens when walking, rated 8/10 currently. Abdominal pressure described as "pressure". Taken Tylenol for pain without reduction in pain. Last liquid patient able to keep done was yesterday morning. Feeling dizzy. Denies urinary frequency, dysuria, hematuria. Having daily bowel movements, denies diarrhea or constipation. Reports a productive cough with white phlegm for past week and fever of 100.3 on Saturday. None since then.     Past Medical History  Diagnosis Date  . Headache(784.0)   . Urinary tract infection     Past Surgical History  Procedure Laterality Date  . No past surgeries      Family History  Problem Relation Age of Onset  . Other Neg Hx   . Diabetes Mother   . Hypertension Maternal Grandmother   . Diabetes Maternal Grandmother   . Cancer Paternal Grandmother   . Cancer Paternal Grandfather   . Hypertension Father   . Cancer Paternal Aunt     bladder cancer   . Asthma Other   . Obesity Other   . Sleep apnea Other   . Heart disease Other     History  Substance Use Topics  . Smoking status: Never Smoker   . Smokeless tobacco: Never Used  . Alcohol Use: No    Allergies:  Allergies  Allergen Reactions  . Keflex [Cephalexin] Itching and Rash    Prescriptions prior to admission  Medication Sig Dispense Refill  . acetaminophen-codeine (TYLENOL #3) 300-30 MG per tablet Take 1-2 tablets by mouth every 6 (six) hours as needed for moderate pain.  15 tablet  0  . guaiFENesin (MUCINEX) 600 MG 12 hr tablet Take 600 mg by mouth 2 (two) times  daily as needed for cough.      . promethazine (PHENERGAN) 25 MG tablet Take 1 tablet (25 mg total) by mouth every 6 (six) hours as needed for nausea or vomiting.  30 tablet  0    Review of Systems  Constitutional: Positive for fever (on saturday, 100.3).  HENT: Negative.   Respiratory: Positive for cough and sputum production.   Cardiovascular: Negative.   Gastrointestinal: Positive for nausea and vomiting. Negative for diarrhea and constipation.  Genitourinary: Negative.   Musculoskeletal:       Lower back pain, see HPI   Neurological: Positive for dizziness.  Psychiatric/Behavioral: Negative.    Physical Exam   Blood pressure 102/65, temperature 98.3 F (36.8 C), temperature source Oral, resp. rate 18, height 5\' 1"  (1.549 m), weight 75.751 kg (167 lb), last menstrual period 12/31/2013.  Physical Exam  Nursing note and vitals reviewed. Constitutional: She is oriented to person, place, and time. She appears well-developed and well-nourished. No distress.  HENT:  Head: Normocephalic and atraumatic.  Neck: Normal range of motion.  Cardiovascular: Normal rate, regular rhythm and normal heart sounds.   Respiratory: Effort normal and breath sounds normal.  GI: Soft. Bowel sounds are normal. There is tenderness (right lower quadrant).  Musculoskeletal: Normal range of motion.  Neurological: She is alert and oriented to person, place, and time.  Skin: Skin is warm and  dry.  Psychiatric: She has a normal mood and affect. Her behavior is normal. Judgment and thought content normal.   Results for orders placed during the hospital encounter of 04/02/14 (from the past 24 hour(s))  URINALYSIS, ROUTINE W REFLEX MICROSCOPIC     Status: Abnormal   Collection Time    04/02/14 10:29 AM      Result Value Ref Range   Color, Urine YELLOW  YELLOW   APPearance HAZY (*) CLEAR   Specific Gravity, Urine >1.030 (*) 1.005 - 1.030   pH 6.0  5.0 - 8.0   Glucose, UA NEGATIVE  NEGATIVE mg/dL   Hgb  urine dipstick TRACE (*) NEGATIVE   Bilirubin Urine NEGATIVE  NEGATIVE   Ketones, ur 15 (*) NEGATIVE mg/dL   Protein, ur NEGATIVE  NEGATIVE mg/dL   Urobilinogen, UA 0.2  0.0 - 1.0 mg/dL   Nitrite NEGATIVE  NEGATIVE   Leukocytes, UA NEGATIVE  NEGATIVE  URINE MICROSCOPIC-ADD ON     Status: Abnormal   Collection Time    04/02/14 10:29 AM      Result Value Ref Range   Squamous Epithelial / LPF FEW (*) RARE   WBC, UA 0-2  <3 WBC/hpf   RBC / HPF 0-2  <3 RBC/hpf   Urine-Other MUCOUS PRESENT     +FHT  MAU Course  Procedures  MDM UA, OB Urine culture D5LR liter bolus, Phenergan injection 12.5mg , Flexeril 5mg   Assessment and Plan  A: Back Pain Nausea/Vomiting in early pregnancy  P: Phenergan 25mg  PO q6h prn for nausea/vomiting Follow-up with OBGYN appointment scheduled on June 1st    Seabron Spates 04/02/2014, 11:55 AM   Seen and examined by me also.  Agree with note Patient felt a lot better after IVF and Flexeril. Seabron Spates, CNM

## 2014-04-02 NOTE — MAU Note (Addendum)
Pain started on Sunday, low back, lower abd. Worse when walks.  Almost like a labor pain.  Been getting very dizzy. Some headache. Pressure with urination

## 2014-04-02 NOTE — Discharge Instructions (Signed)
Dolor de Doctor, general practice  (Back Pain in Pregnancy)  El dolor de espalda es habitual durante el embarazo. Ocurre en aproximadamente la mitad de todos los [place]. Es importante para usted y su beb que permanezca activa durante el [place].Si siente que Conservation officer, historic buildings de espalda es lo que no le permite mantenerse activa o dormir bien, Public affairs consultant a su mdico. La causa del dolor de espalda puede deberse a varios factores relacionados con los cambios durante el [place].Afortunadamente, excepto que haya tenido problemas de espalda antes del [place], es probable que el dolor mejore despus del [place]. El dolor lumbar por lo general ocurre entre el quinto y sptimo mes del Media planner. Sin embargo, puede ocurrir [place] primeros meses. Otros factores que aumentan el riesgo son:   Problemas previos en la espalda.  Lesiones en la espalda.  Tener gemelos o embarazos mltiples.  Tos persistente.  El estrs.  Movimientos repetitivos relacionados con Leander Rams.  Enfermedad muscular o de la columna vertebral en la espalda.  Antecedentes familiares de problemas de espalda, rotura (hernia) de discos u osteoporosis.  Depresin, ansiedad y crisis de [place]. CAUSAS   En las embarazadas, el cuerpo produce una hormona llamada relaxina. Esta hormona hace que los ligamentos que conectan la zona lumbar y los huesos del pubis sean ms flexibles. Esta flexibilidad permite que el beb nazca con ms facilidad. Cuando los ligamentos estn relajados, los msculos tienen que trabajar ms para apoyar la espalda. El dolor en la espalda puede deberse al cansancio muscular. El dolor tambin puede tener su causa en la irritacin de los tejidos de a espalda que se irritan ya que estn recibiendo menos apoyo.  A medida que el beb crece, ejerce presin [employer]vin[employer]itor nervios y los vasos sanguneos de la pelvis. Esto causa dolor de espalda.  A medida que el beb crece y aumenta el peso durante el [place], el  tero presiona los msculos del estmago hacia adelante y [place] su centro de gravedad. Esto hace que los msculos de la espalda deban trabajar ms para mantener una buena [place]. SNTOMAS  Dolor lumbar durante el embarazo Generalmente se produce en la zona o por arriba de la cintura en el centro de la espalda. Puede haber dolor y entumecimiento que se irradia hacia la pierna o el pie. Es similar al dolor de espalda baja experimentada por las mujeres no embarazadas. Por lo general, aumenta al [employer] de pie o sentada por largos perodos de [place], o con levantamientos repetitivos Tambin puede haber sensibilidad en los msculos en la zona superior de la espalda .  Dolor plvico posterior Agricultural consultant en la parte posterior de la pelvis es ms frecuente que el dolor lumbar en el embarazo. Se trata de un dolor profundo que se siente a un lado en la cintura, o a travs del cxis (sacro), o en ambos lugares. Puede sentir dolor en uno o ambos lados Este dolor tambin puede sentirse en las nalgas y el dorso de los muslos Tambin puede haber dolor pbico y en la ingle. El dolor no se mejora rpidamente con el reposo, y [place] puede haber rigidez matutina. Muchas actividades pueden causarlo. Un buen estado fsico antes y durante el embarazo puede o no prevenir este problema. Las contracciones del parto suelen aparecer cada 1 a 2 minutos, tienen una duracin de aproximadamente 1 minuto, e implica una sensacin de empujar o presin en la pelvis. Sin embargo, si usted est a trmino con Water quality scientist, Conservation officer, historic buildings constante  en la zona lumbar puede indicar el comienzo de un parto prematuro, y usted debe ser consciente de ello.  DIAGNSTICO  No se deben tomar radiografas de la [employer]Star las primeras 12 a 14 semanas del embarazo y durante el resto del Media planner, slo cuando sea absolutamente necesario. La resonancia magntica no emite radiacin y es un estudio seguro durante el Media planner. Pero tambin se  deben hacer solamente cuando sea absolutamente necesario.  INSTRUCCIONES PARA EL CUIDADO EN EL HOGAR   Realice actividad fsica segn las indicaciones del mdico. El ejercicio es la manera ms eficaz para prevenir o tratar Conservation officer, historic buildings de espalda. Si tiene un problema en la espalda, es especialmente importante evitar los deportes que requieran de movimientos corporales rpidos. La natacin y las caminatas son las mejores actividades.  No permanezca sentada o de pie en el mismo lugar durante largos perodos.  No use tacos altos.  Sintese en la silla con una buena postura. Use una almohada en su espalda baja si es necesario. Asegrese de que su cabeza descansa sobre sus hombros y no est colgando hacia delante.  Trate de dormir de lado, de preferencia el lado izquierdo, con una o [employer]roleum piernas. Si est dolorida despus de una noche de descanso, la cama puede ser Cendant Corporation.Trate de colocar una tabla entre el colchn y el somier.  Prstele atencin a su cuerpo cuando se levante.Si siente dolor,pida ayuda o trate de doblar las rodillas ms para [employer] International Group de las piernas en lugar de los msculos de la espalda. Pngase en cuclillas al levantar algo del suelo. No se doble.  Consuma una dieta saludable. Trate de aumentar de peso dentro de las recomendaciones de su mdico.  Utilice compresas de calor o fro de 3 a 4 veces al da durante 15 minutos para Glass blower/designer.  Solo tome medicamentos que se pueden comprar sin receta o recetados para Conservation officer, historic buildings, Tree surgeon o fiebre, como le indica el mdico. Dolor de espaldas repentino (agudo).  Haga reposo en cama slo en caso de los episodios ms extremos y agudos de Social research officer, government. El reposo prolongado en cama de ms de 48 horas agravar su trastorno.  El hielo es muy efectivo en los problemas agudos.  Ponga el hielo en una bolsa plstica.  Colquese una toalla entre la piel y la bolsa de hielo.  Deje el hielo durante 10 a 20  minutos cada 2 horas o segn lo nesecite, mientras se encuentre despierta.  Las compresas de calor durante 30 minutos antes de las actividades tambin puede ayudar. Dolor crnico en la espalda. Consulte a su mdico si el dolor es continuo. El mdico podr ayudarla o derivarla para que realice los ejercicios y trabajos de fortalecimiento apropiados. Con un buen entrenamiento fsico, podr evitar la mayor parte de los [place]. En algunos casos, la causa es un problema ms grave. Debe ser controlada inmediatamente si aparecen nuevos problemas. El mdico tambin podr recomendar:   Una faja de maternidad.  Un arns elstico.  Un cors para la espalda.  Un masajista o acupuntura. SOLICITE ATENCIN MDICA SI:   No puede Personal assistant de sus actividades diarias, an tomando los medicamentos para Personnel officer.  Gari Crown ser derivada a un fisioterapeuta o quiroprxico.  Gari Crown intentar con acupuntura. SOLICITE ATENCIN [place] DE INMEDIATO SI:   Siente entumecimiento, hormigueo, debilidad o problemas con el uso de los brazos o las piernas.  Siente un dolor de espalda muy intenso que  no se alivia con medicamentos.  Tiene modificaciones repentinas en el control de la vejiga o el intestino.  Aumenta el dolor en otras partes del cuerpo.  Siente que le falta el aire, se marea o sufre un [place].  Tiene nuseas, vmitos o sudoracin.  Siente un dolor en la espalda similar al del [place] de [place].  Cuando aparece [employer]ra [employer], rompe la bolsa de aguas o tiene un sangrado vaginal.  El dolor o el adormecimiento se extienden hacia la pierna.  El dolor aparece despus de una cada.  Siente dolor de un solo lado. Podra tener clculos renales.  Observa sangre en la orina. Podra tener una infeccin en la vejiga o clculos renales.  Siente dolor y aparecen ronchas. Podra tener culebrilla. El dolor de espalda es bastante frecuente durante el embarazo pero no debe  aceptarse slo como parte del [place]. Siempre debe tratarse lo ms rpidamente posible. Har que su embarazo sea lo ms placentero posible.   Document Released: 07/12/2011 Document Revised: 01/22/2012 [facility] Patient Information 2014 [place], [place].  Nuseas matinales (Morning Sickness) Se denominan nuseas matinales a las ganas de vomitar (nuseas) durante el Media planner. Esta sensacin puede estar acompaada o no de vmitos. Aparecen por la maana, pero puede ser un problema a lo largo de [employer]. Las Honeywell son ms frecuentes Forensic scientist trimestre, Armed forces training and education officer pueden continuar durante todo el [place]. Aunque son molestas, generalmente no causan ningn dao, excepto que presente vmitos continuos e intensos (hiperemesis gravdica). Este problema requiere un tratamiento ms intenso.  CAUSAS  La causa de las nuseas matinales no se conoce completamente pero parecen estar relacionadas con los cambios hormonales que ocurren durante el [place]. FACTORES DE RIESGO Usted tendr mayor riesgo si:  Sufra de nuseas o vmitos antes de [place].  Tuvo nuseas matinales durante los embarazos previos.  Est embarazada de ms de un beb, por ejemplo mellizos. TRATAMIENTO  No utilice ningn medicamento (prescripto, de venta libre ni hierbas) para este problema sin consultar con su mdico. El mdico tambin podr Consulting civil engineer o recomendar:  Suplementos de vitamina B6.  Medicamentos para las nauseas  La medicina herbal llamada jengibre. [place] slo medicamentos de venta libre o recetados, segn las indicaciones del mdico.  Tomar un multivitamnico antes de Product/process development scientist puede prevenir o disminuir la gravedad de las nuseas matinales en la mayora de las mujeres.  Coma un trozo de [place] seca o una cracker sin sal antes de levantarse de la cama por la maana.  Coma 5 o 6 comidas pequeas por da.  Consuma alimentos blandos y  secos (arroz, papas asadas). Los alimentos ricos en hidratos de carbono generalmente ayudan.  No  beba lquidos con las comidas. [place] comidas.  Evite los alimentos muy grasos o condimentados.  [place] a otra persona que cocine para usted si [employer] Corporation de algn alimento le provoca nuseas o vmitos.  Si tiene ganas de vomitar despus de tomar las vitaminas prenatales, tmelas a la noche o con una colacin.  Tome colaciones de alimentos proteicos entre comidas si siente apetito.  Coma gelatina sin azcar de postre.  Una pulsera de acupresin ( que se utiliza para Tree surgeon en viajes) puede ser de [place].  La acupuntura puede ayudarla.  No fume.  Consiga un humidificador para Medical sales representative de su casa libre de [place].  Trate de respirar aire fresco. SOLICITE ATENCIN MDICA SI:   Los remedios caseros no funcionan y  necesita medicamentos.  Se siente mareada o sufre un desmayo.  Pierde peso. SOLICITE ATENCIN MDICA DE INMEDIATO SI:   Tiene nuseas y vmitos de manera persistente y no puede controlarlos.  Pierde el conocimiento (se desmaya). Document Released: 02/15/2009 Document Revised: 07/02/2013 [facility] Patient Information 2014 [place], [place].

## 2014-04-03 LAB — CULTURE, OB URINE
Colony Count: 75000
Special Requests: NORMAL

## 2014-06-10 ENCOUNTER — Ambulatory Visit (INDEPENDENT_AMBULATORY_CARE_PROVIDER_SITE_OTHER): Payer: Self-pay | Admitting: Obstetrics and Gynecology

## 2014-06-10 ENCOUNTER — Encounter: Payer: Self-pay | Admitting: Obstetrics and Gynecology

## 2014-06-10 VITALS — BP 107/63 | HR 89 | Temp 98.3°F | Wt 173.4 lb

## 2014-06-10 DIAGNOSIS — N888 Other specified noninflammatory disorders of cervix uteri: Secondary | ICD-10-CM

## 2014-06-10 DIAGNOSIS — R102 Pelvic and perineal pain: Principal | ICD-10-CM

## 2014-06-10 DIAGNOSIS — N949 Unspecified condition associated with female genital organs and menstrual cycle: Secondary | ICD-10-CM

## 2014-06-10 DIAGNOSIS — O26899 Other specified pregnancy related conditions, unspecified trimester: Secondary | ICD-10-CM

## 2014-06-10 DIAGNOSIS — N898 Other specified noninflammatory disorders of vagina: Secondary | ICD-10-CM

## 2014-06-10 DIAGNOSIS — E669 Obesity, unspecified: Secondary | ICD-10-CM

## 2014-06-10 DIAGNOSIS — O9989 Other specified diseases and conditions complicating pregnancy, childbirth and the puerperium: Secondary | ICD-10-CM

## 2014-06-10 LAB — POCT URINALYSIS DIP (DEVICE)
Bilirubin Urine: NEGATIVE
GLUCOSE, UA: NEGATIVE mg/dL
Hgb urine dipstick: NEGATIVE
Ketones, ur: NEGATIVE mg/dL
NITRITE: NEGATIVE
PH: 5.5 (ref 5.0–8.0)
PROTEIN: NEGATIVE mg/dL
Specific Gravity, Urine: 1.02 (ref 1.005–1.030)
Urobilinogen, UA: 0.2 mg/dL (ref 0.0–1.0)

## 2014-06-10 NOTE — Patient Instructions (Addendum)
Segundo trimestre de embarazo (Second Trimester of Pregnancy) El segundo trimestre va desde la semana13 hasta la 28, desde el cuarto hasta el sexto mes, y suele ser el momento en el que mejor se siente. Su organismo se ha adaptado a estar embarazada y comienza a sentirse fsicamente mejor. En general, las nuseas matutinas han disminuido o han desaparecido completamente, p El segundo trimestre es tambin la poca en la que el feto se desarrolla rpidamente. Hacia el final del sexto mes, el feto mide aproximadamente 9pulgadas (23cm) y pesa alrededor de 1 libras (700g). Es probable que sienta que el beb se mueve (da pataditas) entre las 18 y 20semanas del embarazo. CAMBIOS EN EL ORGANISMO Su organismo atraviesa por muchos cambios durante el embarazo, y estos varan de una mujer a otra.   Seguir aumentando de peso. Notar que la parte baja del abdomen sobresale.  Podrn aparecer las primeras estras en las caderas, el abdomen y las mamas.  Es posible que tenga dolores de cabeza que pueden aliviarse con los medicamentos que su mdico autorice.  Tal vez tenga necesidad de orinar con ms frecuencia porque el feto est ejerciendo presin sobre la vejiga.  Debido al embarazo podr sentir acidez estomacal con frecuencia.  Puede estar estreida, ya que ciertas hormonas enlentecen los movimientos de los msculos que empujan los desechos a travs de los intestinos.  Pueden aparecer hemorroides o abultarse e hincharse las venas (venas varicosas).  Puede tener dolor de espalda que se debe al aumento de peso y a que las hormonas del embarazo relajan las articulaciones entre los huesos de la pelvis, y como consecuencia de la modificacin del peso y los msculos que mantienen el equilibrio.  Las mamas seguirn creciendo y le dolern.  Las encas pueden sangrar y estar sensibles al cepillado y al hilo dental.  Pueden aparecer zonas oscuras o manchas (cloasma, mscara del embarazo) en el rostro que  probablemente se atenuarn despus del nacimiento del beb.  Es posible que se forme una lnea oscura desde el ombligo hasta la zona del pubis (linea nigra) que probablemente se atenuarn despus del nacimiento del beb.  Tal vez haya cambios en el cabello que pueden incluir su engrosamiento, crecimiento rpido y cambios en la textura. Adems, a algunas mujeres se les cae el cabello durante o despus del embarazo, o tienen el cabello seco o fino. Lo ms probable es que el cabello se le normalice despus del nacimiento del beb. QU DEBE ESPERAR EN LAS CONSULTAS PRENATALES Durante una visita prenatal de rutina:  La pesarn para asegurarse de que usted y el feto estn creciendo normalmente.  Le tomarn la presin arterial.  Le medirn el abdomen para controlar el desarrollo del beb.  Se escucharn los latidos cardacos fetales.  Se evaluarn los resultados de los estudios solicitados en visitas anteriores. El mdico puede preguntarle lo siguiente:  Cmo se siente.  Si siente los movimientos del beb.  Si ha tenido sntomas anormales, como prdida de lquido, sangrado, dolores de cabeza intensos o clicos abdominales.  Si tiene alguna pregunta. Otros estudios que podrn realizarse durante el segundo trimestre incluyen lo siguiente:  Anlisis de sangre para detectar:  Concentraciones de hierro bajas (anemia).  Diabetes gestacional (entre la semana 24 y la 28).  Anticuerpos Rh.  Anlisis de orina para detectar infecciones, diabetes o protenas en la orina.  Una ecografa para confirmar que el beb crece y se desarrolla correctamente.  Una amniocentesis para diagnosticar posibles problemas genticos.  Estudios del feto para descartar espina   bfida y sndrome de [place]. INSTRUCCIONES PARA EL CUIDADO EN EL HOGAR   Evite fumar, consumir hierbas, beber alcohol y tomar frmacos que no le hayan recetado. Estas sustancias qumicas afectan la formacin y el desarrollo del beb.  [place] mdico en relacin con el uso de medicamentos. Durante el embarazo, hay medicamentos que son seguros de tomar y otros que no.  Haga actividad fsica solo en la forma indicada por el mdico. Sentir clicos uterinos es un buen signo para Ambulance person actividad fsica.  Contine comiendo alimentos que sanos con regularidad.  Use un sostn que le brinde buen soporte si le [place]strom.  No se d baos de inmersin en agua caliente, baos turcos ni saunas.  Colquese el cinturn de seguridad cuando conduzca.  No coma carne cruda ni queso sin cocinar; evite el contacto con las bandejas sanitarias de los gatos y la tierra que estos animales usan. Estos elementos contienen grmenes que pueden causar defectos congnitos en el beb.  [place].  Si est estreida, pruebe un laxante suave (si el mdico lo autoriza). Consuma ms alimentos ricos en fibra, como vegetales y frutas frescos y Psychologist, prison and probation services. Beba gran cantidad de lquido para mantener la orina de tono claro o color amarillo plido.  Dese baos de asiento con agua tibia para Best boy o las molestias causadas por las hemorroides. Use una crema para las hemorroides si el mdico la autoriza.  Si tiene venas varicosas, use medias de descanso. Eleve los pies durante 32minutos, 3 o 4veces por da. Limite la cantidad de sal en su dieta.  No levante objetos pesados, use zapatos de tacones bajos y mantenga una buena postura.  Descanse con las piernas elevadas si tiene calambres o dolor de cintura.  Visite a su dentista si an no lo ha [place]ar[place] manager. Use un cepillo de dientes blando para higienizarse los dientes y psese el hilo dental con suavidad.  Puede seguir [employer], a menos que el mdico le indique lo contrario.  Concurra a todas las visitas prenatales segn las indicaciones de su mdico. SOLICITE ATENCIN MDICA SI:   Natale Milch.  Siente  clicos leves, presin en la pelvis o dolor persistente en el abdomen.  Tiene nuseas, vmitos o diarrea persistentes.  Tiene secrecin vaginal con mal olor.  Siente dolor al [employer]. SOLICITE ATENCIN MDICA DE INMEDIATO SI:   Tiene fiebre.  Tiene una prdida de lquido por la vagina.  Tiene sangrado o pequeas prdidas vaginales.  Siente dolor intenso o clicos en el abdomen.  Sube o baja de peso rpidamente.  Tiene dificultad para respirar y siente dolor de pecho.  Sbitamente se le hinchan mucho el rostro, las [place], los tobillos, los pies o las piernas.  No ha sentido los movimientos del beb durante [place].  Siente un dolor de cabeza intenso que no se alivia con medicamentos.  Hay cambios en la visin. Document Released: 08/09/2005 Document Revised: 11/04/2013 [facility] Patient Information 2015 [place]. This information is not intended to replace advice given to you by your health care provider. Make sure you discuss any questions you have with your health care provider. Pelvic Rest Pelvic rest is sometimes recommended for women when:   The placenta is partially or completely covering the opening of the cervix (placenta previa).  There is bleeding between the uterine wall and the amniotic sac in the first trimester (subchorionic hemorrhage).  The cervix begins to open without labor starting (  incompetent cervix, cervical insufficiency).  The labor is too early (preterm labor). HOME CARE INSTRUCTIONS  Do not have sexual intercourse, stimulation, or an orgasm.  Do not use tampons, douche, or put anything in the vagina.  Do not lift anything over 10 pounds (4.5 kg).  Avoid strenuous activity or straining your pelvic muscles. SEEK MEDICAL CARE IF:  You have any vaginal bleeding during pregnancy. Treat this as a potential emergency.  You have cramping pain felt low in the stomach (stronger than menstrual cramps).  You notice vaginal discharge (watery,  mucus, or bloody).  You have a low, dull backache.  There are regular contractions or uterine tightening. SEEK IMMEDIATE MEDICAL CARE IF: You have vaginal bleeding and have placenta previa.  Document Released: 02/24/2011 Document Revised: 01/22/2012 Document Reviewed: 02/24/2011 [facility] Patient Information 2015 [place], [place]. This information is not intended to replace advice given to you by your health care provider. Make sure you discuss any questions you have with your health care provider.

## 2014-06-10 NOTE — Progress Notes (Signed)
Subjective:    Debra Allen is a [id number] [redacted]w[redacted]d being seen today for her first obstetrical visit. She has been seen in [facility] and had [place] at [redacted]w[redacted]d.  Her obstetrical history is significant for macrosomia 9#9oz with no GDM or SD. Patient does intend to breast feed. Pregnancy history fully reviewed.  Patient reports backache. Has had LBP throughout pregnancy and used Flexeril with relief. [place] about a week ago and it got worse. Had spotting 4 days ago and has irritative vaginal discharge.   Filed Vitals:   06/10/14 0937  BP: 107/63  Pulse: 89  Temp: 98.3 F (36.8 C)  Weight: 173 lb 6.4 oz (78.654 kg)    HISTORY: OB History  Gravida Para Term Preterm AB SAB TAB Ectopic Multiple Living  4 3 3  0 0 0 0 0 0 3    # Outcome Date GA Lbr Len/2nd Weight Sex Delivery Anes PTL Lv  4 CUR           3 TRM 07/13/13 [redacted]w[redacted]d 10:16 / 00:07 9 lb 9.3 oz (4.345 kg) F SVD None  Y  2 TRM 03/31/11 [redacted]w[redacted]d  8 lb (3.629 kg) M SVD None  Y  1 TRM 03/23/06 [redacted]w[redacted]d  8 lb 5 oz (3.771 kg) F SVD EPI  Y     Past Medical History  Diagnosis Date  . Headache(784.0)   . Urinary tract infection    Past Surgical History  Procedure Laterality Date  . No past surgeries     Family History  Problem Relation Age of Onset  . Other Neg Hx   . Diabetes Mother   . Hypertension Maternal Grandmother   . Diabetes Maternal Grandmother   . Cancer Paternal Grandmother   . Cancer Paternal Grandfather   . Hypertension Father   . Cancer Paternal Aunt     bladder cancer   . Asthma Other   . Obesity Other   . Sleep apnea Other   . Heart disease Other      Exam    Uterus:  Fundal Height: 25 cm S=D, FHR 150  Pelvic Exam:    Perineum: No Hemorrhoids, Normal Perineum   Vulva: normal, Bartholin's, Urethra, Skene's normal   Vagina:  normal mucosa, wet prep done, thick white discharge       Cervix: multiparous appearance and friable with Pap. GC/CT sent   Adnexa: not evaluated   Bony Pelvis: average  System: Breast:   normal appearance, no masses or tenderness   Skin: normal coloration and turgor, no rashes    Neurologic: oriented, grossly non-focal   Extremities: normal strength, tone, and muscle mass   HEENT PERRLA and thyroid without masses   Mouth/Teeth mucous membranes moist, pharynx normal without lesions and dental hygiene good   Neck supple   Cardiovascular: regular rate and rhythm, no murmurs or gallops   Respiratory:  appears well, vitals normal, no respiratory distress, acyanotic, normal RR, ear and throat exam is normal, neck free of mass or lymphadenopathy, chest clear, no wheezing, crepitations, rhonchi, normal symmetric air entry   Abdomen: soft, non-tender; bowel sounds normal; no masses,  no organomegaly   Urinary: urethral meatus normal      Assessment:    Pregnancy: [id number] Patient Active Problem List   Diagnosis Date Noted  . Friable cervix 06/10/2014  . Mildly obese 06/10/2014  . Normal intrauterine pregnancy on prenatal ultrasound 02/12/2013        Plan:     Initial labs drawn. Prenatal vitamins. Problem  list reviewed and updated. Genetic Screening discussed Quad Screen: too late.  Ultrasound discussed; fetal survey: ordered.  Follow up in 4 weeks. 50% of 30 min visit spent on counseling and coordination of care.  1 hr OGTT   POE,DEIRDRE 06/10/2014

## 2014-06-10 NOTE — Progress Notes (Signed)
Patient reports pelvic pressure and lower back pain; patient states she is feeling moment but not a lot

## 2014-06-11 LAB — OBSTETRIC PANEL
Antibody Screen: NEGATIVE
Basophils Absolute: 0 10*3/uL (ref 0.0–0.1)
Basophils Relative: 0 % (ref 0–1)
Eosinophils Absolute: 0.1 10*3/uL (ref 0.0–0.7)
Eosinophils Relative: 1 % (ref 0–5)
HCT: 32 % — ABNORMAL LOW (ref 36.0–46.0)
Hemoglobin: 10.9 g/dL — ABNORMAL LOW (ref 12.0–15.0)
Hepatitis B Surface Ag: NEGATIVE
Lymphocytes Relative: 23 % (ref 12–46)
Lymphs Abs: 1.6 10*3/uL (ref 0.7–4.0)
MCH: 29.4 pg (ref 26.0–34.0)
MCHC: 34.1 g/dL (ref 30.0–36.0)
MCV: 86.3 fL (ref 78.0–100.0)
Monocytes Absolute: 0.3 10*3/uL (ref 0.1–1.0)
Monocytes Relative: 5 % (ref 3–12)
Neutro Abs: 4.9 10*3/uL (ref 1.7–7.7)
Neutrophils Relative %: 71 % (ref 43–77)
Platelets: 170 10*3/uL (ref 150–400)
RBC: 3.71 MIL/uL — ABNORMAL LOW (ref 3.87–5.11)
RDW: 14 % (ref 11.5–15.5)
Rh Type: POSITIVE
Rubella: 10.2 Index — ABNORMAL HIGH (ref <0.90)
WBC: 6.9 10*3/uL (ref 4.0–10.5)

## 2014-06-11 LAB — GLUCOSE TOLERANCE, 1 HOUR (50G) W/O FASTING: Glucose, 1 Hour GTT: 109 mg/dL (ref 70–140)

## 2014-06-11 LAB — WET PREP, GENITAL
Clue Cells Wet Prep HPF POC: NONE SEEN
Trich, Wet Prep: NONE SEEN
WBC, Wet Prep HPF POC: NONE SEEN
Yeast Wet Prep HPF POC: NONE SEEN

## 2014-06-11 LAB — HIV ANTIBODY (ROUTINE TESTING W REFLEX): HIV: NONREACTIVE

## 2014-06-12 ENCOUNTER — Ambulatory Visit (HOSPITAL_COMMUNITY)
Admission: RE | Admit: 2014-06-12 | Discharge: 2014-06-12 | Disposition: A | Discharge status: Home / Self Care | Payer: Self-pay | Source: Ambulatory Visit | Attending: Obstetrics and Gynecology | Admitting: Obstetrics and Gynecology

## 2014-06-12 DIAGNOSIS — O26899 Other specified pregnancy related conditions, unspecified trimester: Secondary | ICD-10-CM

## 2014-06-12 DIAGNOSIS — N949 Unspecified condition associated with female genital organs and menstrual cycle: Secondary | ICD-10-CM | POA: Insufficient documentation

## 2014-06-12 DIAGNOSIS — R102 Pelvic and perineal pain: Secondary | ICD-10-CM

## 2014-06-12 DIAGNOSIS — Z3689 Encounter for other specified antenatal screening: Secondary | ICD-10-CM | POA: Insufficient documentation

## 2014-06-12 DIAGNOSIS — E669 Obesity, unspecified: Secondary | ICD-10-CM | POA: Insufficient documentation

## 2014-06-12 DIAGNOSIS — O99891 Other specified diseases and conditions complicating pregnancy: Secondary | ICD-10-CM | POA: Insufficient documentation

## 2014-06-12 DIAGNOSIS — N888 Other specified noninflammatory disorders of cervix uteri: Secondary | ICD-10-CM

## 2014-06-12 DIAGNOSIS — O9989 Other specified diseases and conditions complicating pregnancy, childbirth and the puerperium: Principal | ICD-10-CM

## 2014-06-12 DIAGNOSIS — N898 Other specified noninflammatory disorders of vagina: Secondary | ICD-10-CM

## 2014-06-12 DIAGNOSIS — O99210 Obesity complicating pregnancy, unspecified trimester: Secondary | ICD-10-CM

## 2014-06-12 DIAGNOSIS — Z3492 Encounter for supervision of normal pregnancy, unspecified, second trimester: Secondary | ICD-10-CM

## 2014-06-12 LAB — CULTURE, OB URINE

## 2014-06-12 LAB — PRESCRIPTION MONITORING PROFILE (19 PANEL)
Amphetamine/Meth: NEGATIVE ng/mL
BARBITURATE SCREEN, URINE: NEGATIVE ng/mL
BUPRENORPHINE, URINE: NEGATIVE ng/mL
Benzodiazepine Screen, Urine: NEGATIVE ng/mL
CANNABINOID SCRN UR: NEGATIVE ng/mL
COCAINE METABOLITES: NEGATIVE ng/mL
Carisoprodol, Urine: NEGATIVE ng/mL
Creatinine, Urine: 167.18 mg/dL (ref >20.0)
FENTANYL URINE: NEGATIVE ng/mL
MDMA URINE: NEGATIVE ng/mL
METHAQUALONE SCREEN (URINE): NEGATIVE ng/mL
Meperidine, Ur: NEGATIVE ng/mL
Methadone Screen, Urine: NEGATIVE ng/mL
Nitrites, Initial: NEGATIVE ug/mL
OPIATE SCREEN, URINE: NEGATIVE ng/mL
OXYCODONE SCRN UR: NEGATIVE ng/mL
PHENCYCLIDINE, UR: NEGATIVE ng/mL
Propoxyphene: NEGATIVE ng/mL
Tapentadol, urine: NEGATIVE ng/mL
Tramadol Scrn, Ur: NEGATIVE ng/mL
Zolpidem, Urine: NEGATIVE ng/mL
pH, Initial: 6.1 pH (ref 4.5–8.9)

## 2014-06-12 LAB — CYTOLOGY - PAP

## 2014-06-18 ENCOUNTER — Telehealth: Payer: Self-pay

## 2014-06-18 NOTE — Telephone Encounter (Signed)
Patient called stating she has been having tooth pain and called her dentist but they cannot see her for 3+weeks. Informed patient I would send referral to [facility] dental clinic and that they should call her with an appointment. Gave her clinic number [phone]) should she not hear from them. Informed patient tylenol would be fine to take to help with pain in the interim. Patient verbalized understanding and gratitude. No further questions or concerns.  Patient last office notes and demographics faxed to [place] [facility] [phone].

## 2014-07-15 ENCOUNTER — Encounter: Payer: Self-pay | Admitting: Advanced Practice Midwife

## 2014-07-15 ENCOUNTER — Ambulatory Visit (INDEPENDENT_AMBULATORY_CARE_PROVIDER_SITE_OTHER): Payer: Self-pay | Admitting: Advanced Practice Midwife

## 2014-07-15 VITALS — BP 104/62 | HR 89 | Temp 98.2°F | Wt 177.1 lb

## 2014-07-15 DIAGNOSIS — O368190 Decreased fetal movements, unspecified trimester, not applicable or unspecified: Secondary | ICD-10-CM

## 2014-07-15 DIAGNOSIS — Z23 Encounter for immunization: Secondary | ICD-10-CM

## 2014-07-15 DIAGNOSIS — Z3492 Encounter for supervision of normal pregnancy, unspecified, second trimester: Secondary | ICD-10-CM

## 2014-07-15 DIAGNOSIS — IMO0002 Reserved for concepts with insufficient information to code with codable children: Secondary | ICD-10-CM | POA: Insufficient documentation

## 2014-07-15 DIAGNOSIS — IMO0001 Reserved for inherently not codable concepts without codable children: Secondary | ICD-10-CM

## 2014-07-15 DIAGNOSIS — O3090 Multiple gestation, unspecified, unspecified trimester: Secondary | ICD-10-CM

## 2014-07-15 DIAGNOSIS — O368121 Decreased fetal movements, second trimester, fetus 1: Secondary | ICD-10-CM

## 2014-07-15 DIAGNOSIS — Z3480 Encounter for supervision of other normal pregnancy, unspecified trimester: Secondary | ICD-10-CM

## 2014-07-15 LAB — CBC
HEMATOCRIT: 29.9 % — AB (ref 36.0–46.0)
Hemoglobin: 10.3 g/dL — ABNORMAL LOW (ref 12.0–15.0)
MCH: 28.9 pg (ref 26.0–34.0)
MCHC: 34.4 g/dL (ref 30.0–36.0)
MCV: 83.8 fL (ref 78.0–100.0)
Platelets: 168 10*3/uL (ref 150–400)
RBC: 3.57 MIL/uL — ABNORMAL LOW (ref 3.87–5.11)
RDW: 13.8 % (ref 11.5–15.5)
WBC: 5.8 10*3/uL (ref 4.0–10.5)

## 2014-07-15 LAB — POCT URINALYSIS DIP (DEVICE)
BILIRUBIN URINE: NEGATIVE
Glucose, UA: NEGATIVE mg/dL
Hgb urine dipstick: NEGATIVE
Ketones, ur: NEGATIVE mg/dL
Nitrite: NEGATIVE
PH: 5.5 (ref 5.0–8.0)
Protein, ur: NEGATIVE mg/dL
Specific Gravity, Urine: 1.015 (ref 1.005–1.030)
Urobilinogen, UA: 0.2 mg/dL (ref 0.0–1.0)

## 2014-07-15 LAB — HIV ANTIBODY (ROUTINE TESTING W REFLEX): HIV: NONREACTIVE

## 2014-07-15 LAB — GLUCOSE TOLERANCE, 1 HOUR (50G) W/O FASTING: GLUCOSE 1 HOUR GTT: 105 mg/dL (ref 70–140)

## 2014-07-15 LAB — RPR

## 2014-07-15 MED ORDER — TETANUS-DIPHTH-ACELL PERTUSSIS 5-2.5-18.5 LF-MCG/0.5 IM SUSP
0.5000 mL | Freq: Once | INTRAMUSCULAR | Status: AC
  Administered 2014-07-15: 0.5 mL via INTRAMUSCULAR

## 2014-07-15 NOTE — Progress Notes (Signed)
Pt wants to change to gummie vitamins, prenatal vitamins make her sick. Pt complains RLQ pain for past 2 wks.

## 2014-07-15 NOTE — Patient Instructions (Signed)
Third Trimester of Pregnancy The third trimester is from week 29 through week 42, months 7 through 9. The third trimester is a time when the fetus is growing rapidly. At the end of the ninth month, the fetus is about 20 inches in length and weighs 6-10 pounds.  BODY CHANGES Your body goes through many changes during pregnancy. The changes vary from woman to woman.   Your weight will continue to increase. You can expect to gain 25-35 pounds (11-16 kg) by the end of the pregnancy.  You may begin to get stretch marks on your hips, abdomen, and breasts.  You may urinate more often because the fetus is moving lower into your pelvis and pressing on your bladder.  You may develop or continue to have heartburn as a result of your pregnancy.  You may develop constipation because certain hormones are causing the muscles that push waste through your intestines to slow down.  You may develop hemorrhoids or swollen, bulging veins (varicose veins).  You may have pelvic pain because of the weight gain and pregnancy hormones relaxing your joints between the bones in your pelvis. Backaches may result from overexertion of the muscles supporting your posture.  You may have changes in your hair. These can include thickening of your hair, rapid growth, and changes in texture. Some women also have hair loss during or after pregnancy, or hair that feels dry or thin. Your hair will most likely return to normal after your baby is born.  Your breasts will continue to grow and be tender. A yellow discharge may leak from your breasts called colostrum.  Your belly button may stick out.  You may feel short of breath because of your expanding uterus.  You may notice the fetus "dropping," or moving lower in your abdomen.  You may have a bloody mucus discharge. This usually occurs a few days to a week before labor begins.  Your cervix becomes thin and soft (effaced) near your due date. WHAT TO EXPECT AT YOUR PRENATAL  EXAMS  You will have prenatal exams every 2 weeks until week 36. Then, you will have weekly prenatal exams. During a routine prenatal visit:  You will be weighed to make sure you and the fetus are growing normally.  Your blood pressure is taken.  Your abdomen will be measured to track your baby's growth.  The fetal heartbeat will be listened to.  Any test results from the previous visit will be discussed.  You may have a cervical check near your due date to see if you have effaced. At around 36 weeks, your caregiver will check your cervix. At the same time, your caregiver will also perform a test on the secretions of the vaginal tissue. This test is to determine if a type of bacteria, Group B streptococcus, is present. Your caregiver will explain this further. Your caregiver may ask you:  What your birth plan is.  How you are feeling.  If you are feeling the baby move.  If you have had any abnormal symptoms, such as leaking fluid, bleeding, severe headaches, or abdominal cramping.  If you have any questions. Other tests or screenings that may be performed during your third trimester include:  Blood tests that check for low iron levels (anemia).  Fetal testing to check the health, activity level, and growth of the fetus. Testing is done if you have certain medical conditions or if there are problems during the pregnancy. FALSE LABOR You may feel small, irregular contractions that   eventually go away. These are called Braxton Hicks contractions, or false labor. Contractions may last for hours, days, or even weeks before true labor sets in. If contractions come at regular intervals, intensify, or become painful, it is best to be seen by your caregiver.  SIGNS OF LABOR   Menstrual-like cramps.  Contractions that are 5 minutes apart or less.  Contractions that start on the top of the uterus and spread down to the lower abdomen and back.  A sense of increased pelvic pressure or back  pain.  A watery or bloody mucus discharge that comes from the vagina. If you have any of these signs before the 37th week of pregnancy, call your caregiver right away. You need to go to the hospital to get checked immediately. HOME CARE INSTRUCTIONS   Avoid all smoking, herbs, alcohol, and unprescribed drugs. These chemicals affect the formation and growth of the baby.  Follow your caregiver's instructions regarding medicine use. There are medicines that are either safe or unsafe to take during pregnancy.  Exercise only as directed by your caregiver. Experiencing uterine cramps is a good sign to stop exercising.  Continue to eat regular, healthy meals.  Wear a good support bra for breast tenderness.  Do not use hot tubs, steam rooms, or saunas.  Wear your seat belt at all times when driving.  Avoid raw meat, uncooked cheese, cat litter boxes, and soil used by cats. These carry germs that can cause birth defects in the baby.  Take your prenatal vitamins.  Try taking a stool softener (if your caregiver approves) if you develop constipation. Eat more high-fiber foods, such as fresh vegetables or fruit and whole grains. Drink plenty of fluids to keep your urine clear or pale yellow.  Take warm sitz baths to soothe any pain or discomfort caused by hemorrhoids. Use hemorrhoid cream if your caregiver approves.  If you develop varicose veins, wear support hose. Elevate your feet for 15 minutes, 3-4 times a day. Limit salt in your diet.  Avoid heavy lifting, wear low heal shoes, and practice good posture.  Rest a lot with your legs elevated if you have leg cramps or low back pain.  Visit your dentist if you have not gone during your pregnancy. Use a soft toothbrush to brush your teeth and be gentle when you floss.  A sexual relationship may be continued unless your caregiver directs you otherwise.  Do not travel far distances unless it is absolutely necessary and only with the approval  of your caregiver.  Take prenatal classes to understand, practice, and ask questions about the labor and delivery.  Make a trial run to the hospital.  Pack your hospital bag.  Prepare the baby's nursery.  Continue to go to all your prenatal visits as directed by your caregiver. SEEK MEDICAL CARE IF:  You are unsure if you are in labor or if your water has broken.  You have dizziness.  You have mild pelvic cramps, pelvic pressure, or nagging pain in your abdominal area.  You have persistent nausea, vomiting, or diarrhea.  You have a bad smelling vaginal discharge.  You have pain with urination. SEEK IMMEDIATE MEDICAL CARE IF:   You have a fever.  You are leaking fluid from your vagina.  You have spotting or bleeding from your vagina.  You have severe abdominal cramping or pain.  You have rapid weight loss or gain.  You have shortness of breath with chest pain.  You notice sudden or extreme swelling   of your face, hands, ankles, feet, or legs.  You have not felt your baby move in over an hour.  You have severe headaches that do not go away with medicine.  You have vision changes. Document Released: 10/24/2001 Document Revised: 11/04/2013 Document Reviewed: 12/31/2012 Exit[employer] Patient Information 2015 [employer]. This information is not intended to replace advice given to you by your health care provider. Make sure you discuss any questions you have with your health care provider.  

## 2014-07-15 NOTE — Progress Notes (Signed)
Describes decrease in fetal movement over past week. Also having 2-3 UCs per hour at times, not consistently.  Also has some reflux and epigastric pain. [place] reviewed:  Anatomy normal, has marginal cord insertion.  Consulted Dr Nehemiah Settle: Recommends getting NST  >> very reactive with several +15 b accels. No decels.

## 2014-07-17 ENCOUNTER — Encounter: Payer: Self-pay | Admitting: Advanced Practice Midwife

## 2014-07-17 DIAGNOSIS — O0930 Supervision of pregnancy with insufficient antenatal care, unspecified trimester: Secondary | ICD-10-CM | POA: Insufficient documentation

## 2014-07-28 ENCOUNTER — Encounter: Payer: Self-pay | Admitting: Internal Medicine

## 2014-07-28 ENCOUNTER — Ambulatory Visit: Payer: Self-pay | Attending: Internal Medicine | Admitting: Internal Medicine

## 2014-07-28 VITALS — BP 102/64 | HR 80 | Temp 98.4°F | Resp 16 | Wt 181.8 lb

## 2014-07-28 DIAGNOSIS — K0889 Other specified disorders of teeth and supporting structures: Secondary | ICD-10-CM

## 2014-07-28 DIAGNOSIS — K089 Disorder of teeth and supporting structures, unspecified: Secondary | ICD-10-CM | POA: Insufficient documentation

## 2014-07-28 DIAGNOSIS — J029 Acute pharyngitis, unspecified: Secondary | ICD-10-CM | POA: Insufficient documentation

## 2014-07-28 DIAGNOSIS — Z331 Pregnant state, incidental: Secondary | ICD-10-CM | POA: Insufficient documentation

## 2014-07-28 MED ORDER — CLINDAMYCIN HCL 150 MG PO CAPS: 150.0000 mg | ORAL_CAPSULE | Freq: Three times a day (TID) | ORAL | Status: DC

## 2014-07-28 NOTE — Progress Notes (Signed)
MRN: [id number] Name: Debra Allen  Sex: female Age: 28 y.o. DOB: 22-Apr-1986  Allergies: Keflex  Chief Complaint  Patient presents with  . Sore Throat    HPI: Patient is 28 y.o. female who complained of fever sore throat one week ago which now has resolved but she has right upper and lower molar dental pain,  because she's pregnant she was advised to take only Tylenol when necessary for pain as per patient she has taken it as well as applied Orajel, still she is symptomatic currently denies any fever chills chest and shortness of breath. Patient also needs information to see a dentist.  Past Medical History  Diagnosis Date  . Headache(784.0)   . Urinary tract infection     Past Surgical History  Procedure Laterality Date  . No past surgeries        Medication List       This list is accurate as of: 07/28/14 10:03 AM.  Always use your most recent med list.               clindamycin 150 MG capsule  Commonly known as:  CLEOCIN  Take 1 capsule (150 mg total) by mouth 3 (three) times daily.     prenatal vitamin w/FE, FA 27-1 MG Tabs tablet  Take 1 tablet by mouth daily at 12 noon.        Meds ordered this encounter  Medications  . clindamycin (CLEOCIN) 150 MG capsule    Sig: Take 1 capsule (150 mg total) by mouth 3 (three) times daily.    Dispense:  21 capsule    Refill:  0    Immunization History  Administered Date(s) Administered  . Influenza Split 09/07/2011, 12/25/2012  . Tdap 06/11/2013, 07/15/2014    Family History  Problem Relation Age of Onset  . Other Neg Hx   . Diabetes Mother   . Hypertension Maternal Grandmother   . Diabetes Maternal Grandmother   . Cancer Paternal Grandmother   . Cancer Paternal Grandfather   . Hypertension Father   . Cancer Paternal Aunt     bladder cancer   . Asthma Other   . Obesity Other   . Sleep apnea Other   . Heart disease Other     History  Substance Use Topics  . Smoking status: Never Smoker    . Smokeless tobacco: Never Used  . Alcohol Use: No    Review of Systems   As noted in HPI  Filed Vitals:   07/28/14 0931  BP: 102/64  Pulse: 80  Temp: 98.4 F (36.9 C)  Resp: 16    Physical Exam  Physical Exam  Constitutional: No distress.  HENT:  Nose: Nose normal.  Mouth/Throat: Oropharynx is clear and moist.  Dental cavities.  Eyes: EOM are normal. Pupils are equal, round, and reactive to light.  Cardiovascular: Normal rate and regular rhythm.   Pulmonary/Chest: Breath sounds normal. No respiratory distress. She has no wheezes. She has no rales.    CBC    Component Value Date/Time   WBC 5.8 07/15/2014 1025   RBC 3.57* 07/15/2014 1025   HGB 10.3* 07/15/2014 1025   HCT 29.9* 07/15/2014 1025   PLT 168 07/15/2014 1025   MCV 83.8 07/15/2014 1025   LYMPHSABS 1.6 06/10/2014 1314   MONOABS 0.3 06/10/2014 1314   EOSABS 0.1 06/10/2014 1314   BASOSABS 0.0 06/10/2014 1314    CMP     Component Value Date/Time   NA 141 10/07/2013 1038  K 4.3 10/07/2013 1038   CL 102 10/07/2013 1038   CO2 25 10/07/2013 1038   GLUCOSE 89 10/07/2013 1038   BUN 11 10/07/2013 1038   CREATININE 0.47* 10/07/2013 1038   CREATININE 0.49* 08/13/2012 1630   CALCIUM 9.6 10/07/2013 1038   PROT 7.5 10/07/2013 1038   ALBUMIN 4.8 10/07/2013 1038   AST 29 10/07/2013 1038   ALT 76* 10/07/2013 1038   ALKPHOS 76 10/07/2013 1038   BILITOT 1.0 10/07/2013 1038   GFRNONAA >89 10/07/2013 1038   GFRNONAA >90 08/13/2012 1630   GFRAA >89 10/07/2013 1038   GFRAA >90 08/13/2012 1630    Lab Results  Component Value Date/Time   CHOL 160 10/07/2013 10:38 AM    No components found with this basename: [id number]    Lab Results  Component Value Date/Time   AST 29 10/07/2013 10:38 AM    Assessment and Plan  Pain, dental - Plan: Patient will take Tylenol as when necessary, have started patient on clindamycin (CLEOCIN) 150 MG capsule, also Ambulatory referral to Dentistry  Sore throat - Plan: Rapid Strep A is  negative, Throat culture [facility] Loop) will be sent.  Return if symptoms worsen or fail to improve.  Lorayne Marek, MD

## 2014-07-28 NOTE — Progress Notes (Signed)
Patient complains of having a sore throat for the past week Having some nasal congestion as well Patient states did have fever but has since broke Complains of tooth pain to the right side of her mouth

## 2014-07-29 ENCOUNTER — Ambulatory Visit (INDEPENDENT_AMBULATORY_CARE_PROVIDER_SITE_OTHER): Payer: Self-pay | Admitting: Physician Assistant

## 2014-07-29 VITALS — BP 109/61 | HR 93 | Temp 98.2°F | Wt 180.5 lb

## 2014-07-29 DIAGNOSIS — O0930 Supervision of pregnancy with insufficient antenatal care, unspecified trimester: Secondary | ICD-10-CM

## 2014-07-29 DIAGNOSIS — O368190 Decreased fetal movements, unspecified trimester, not applicable or unspecified: Secondary | ICD-10-CM

## 2014-07-29 DIAGNOSIS — Z23 Encounter for immunization: Secondary | ICD-10-CM

## 2014-07-29 LAB — POCT URINALYSIS DIP (DEVICE)
Bilirubin Urine: NEGATIVE
Glucose, UA: NEGATIVE mg/dL
HGB URINE DIPSTICK: NEGATIVE
KETONES UR: NEGATIVE mg/dL
Nitrite: NEGATIVE
PH: 5.5 (ref 5.0–8.0)
PROTEIN: NEGATIVE mg/dL
Specific Gravity, Urine: 1.015 (ref 1.005–1.030)
Urobilinogen, UA: 0.2 mg/dL (ref 0.0–1.0)

## 2014-07-29 NOTE — Progress Notes (Signed)
No interpreter necessary.  Reports decrease in fetal movement-- states before Saturday " I was feeling baby move a lot every hour and at night. But now only feel baby move 1-2 times per hour."  Reports in hands and feet.

## 2014-07-29 NOTE — Patient Instructions (Signed)
Tercer trimestre de embarazo (Third Trimester of Pregnancy) El tercer trimestre va desde la semana29 hasta la 42, desde el sptimo hasta el noveno mes, y es la poca en la que el feto crece ms rpidamente. Hacia el final del noveno mes, el feto mide alrededor de 20pulgadas (45cm) de largo y pesa entre 6 y 10 libras (2,700 y 4,500kg).  CAMBIOS EN EL ORGANISMO Su organismo atraviesa por muchos cambios durante el embarazo, y estos varan de una mujer a otra.   Seguir aumentando de peso. Es de esperar que aumente entre 25 y 35libras (11 y 16kg) hacia el final del embarazo.  Podrn aparecer las primeras estras en las caderas, el abdomen y las mamas.  Puede tener necesidad de orinar con ms frecuencia porque el feto baja hacia la pelvis y ejerce presin sobre la vejiga.  Debido al embarazo podr sentir acidez estomacal con frecuencia.  Puede estar estreida, ya que ciertas hormonas enlentecen los movimientos de los msculos que empujan los desechos a travs de los intestinos.  Pueden aparecer hemorroides o abultarse e hincharse las venas (venas varicosas).  Puede sentir dolor plvico debido al aumento de peso y a que las hormonas del embarazo relajan las articulaciones entre los huesos de la pelvis. El dolor de espalda puede ser consecuencia de la sobrecarga de los msculos que soportan la postura.  Tal vez haya cambios en el cabello que pueden incluir su engrosamiento, crecimiento rpido y cambios en la textura. Adems, a algunas mujeres se les cae el cabello durante o despus del embarazo, o tienen el cabello seco o fino. Lo ms probable es que el cabello se le normalice despus del nacimiento del beb.  Las mamas seguirn creciendo y le dolern. A veces, puede haber una secrecin amarilla de las mamas llamada calostro.  El ombligo puede salir hacia afuera.  Puede sentir que le falta el aire debido a que se expande el tero.  Puede notar que el feto "baja" o lo siente ms bajo, en el  abdomen.  Puede tener una prdida de secrecin mucosa con sangre. Esto suele ocurrir en el trmino de unos pocos das a una semana antes de que comience el trabajo de parto.  El cuello del tero se vuelve delgado y blando (se borra) cerca de la fecha de parto. QU DEBE ESPERAR EN LOS EXMENES PRENATALES  Le harn exmenes prenatales cada 2semanas hasta la semana36. A partir de ese momento le harn exmenes semanales. Durante una visita prenatal de rutina:  La pesarn para asegurarse de que usted y el feto estn creciendo normalmente.  Le tomarn la presin arterial.  Le medirn el abdomen para controlar el desarrollo del beb.  Se escucharn los latidos cardacos fetales.  Se evaluarn los resultados de los estudios solicitados en visitas anteriores.  Le revisarn el cuello del tero cuando est prxima la fecha de parto para controlar si este se ha borrado. Alrededor de la semana36, el mdico le revisar el cuello del tero. Al mismo tiempo, realizar un anlisis de las secreciones del tejido vaginal. Este examen es para determinar si hay un tipo de bacteria, estreptococo Grupo B. El mdico le explicar esto con ms detalle. El mdico puede preguntarle lo siguiente:  Cmo le gustara que fuera el parto.  Cmo se siente.  Si siente los movimientos del beb.  Si ha tenido sntomas anormales, como prdida de lquido, sangrado, dolores de cabeza intensos o clicos abdominales.  Si tiene alguna pregunta. Otros exmenes o estudios de deteccin que pueden realizarse   durante el tercer trimestre incluyen lo siguiente:  Anlisis de sangre para controlar las concentraciones de hierro (anemia).  Controles fetales para determinar su salud, nivel de actividad y crecimiento. Si tiene alguna enfermedad o hay problemas durante el embarazo, le harn estudios. FALSO TRABAJO DE PARTO Es posible que sienta contracciones leves e irregulares que finalmente desaparecen. Se llaman contracciones de  Braxton Hicks o falso trabajo de parto. Las contracciones pueden durar horas, das o incluso semanas, antes de que el verdadero trabajo de parto se inicie. Si las contracciones ocurren a intervalos regulares, se intensifican o se hacen dolorosas, lo mejor es que la revise el mdico.  SIGNOS DE TRABAJO DE PARTO   Clicos de tipo menstrual.  Contracciones cada 5minutos o menos.  Contracciones que comienzan en la parte superior del tero y se extienden hacia abajo, a la zona inferior del abdomen y la espalda.  Sensacin de mayor presin en la pelvis o dolor de espalda.  Una secrecin de mucosidad acuosa o con sangre que sale de la vagina. Si tiene alguno de estos signos antes de la semana37 del embarazo, llame a su mdico de inmediato. Debe concurrir al hospital para que la controlen inmediatamente. INSTRUCCIONES PARA EL CUIDADO EN EL HOGAR   Evite fumar, consumir hierbas, beber alcohol y tomar frmacos que no le hayan recetado. Estas sustancias qumicas afectan la formacin y el desarrollo del beb.  Siga las indicaciones del mdico en relacin con el uso de medicamentos. Durante el embarazo, hay medicamentos que son seguros de tomar y otros que no.  Haga actividad fsica solo en la forma indicada por el mdico. Sentir clicos uterinos es un buen signo para detener la actividad fsica.  Contine comiendo alimentos que sanos con regularidad.  Use un sostn que le brinde buen soporte si le duelen las mamas.  No se d baos de inmersin en agua caliente, baos turcos ni saunas.  Colquese el cinturn de seguridad cuando conduzca.  No coma carne cruda ni queso sin cocinar; evite el contacto con las bandejas sanitarias de los gatos y la tierra que estos animales usan. Estos elementos contienen grmenes que pueden causar defectos congnitos en el beb.  Tome las vitaminas prenatales.  Si est estreida, pruebe un laxante suave (si el mdico lo autoriza). Consuma ms alimentos ricos en  fibra, como vegetales y frutas frescos y cereales integrales. Beba gran cantidad de lquido para mantener la orina de tono claro o color amarillo plido.  Dese baos de asiento con agua tibia para aliviar el dolor o las molestias causadas por las hemorroides. Use una crema para las hemorroides si el mdico la autoriza.  Si tiene venas varicosas, use medias de descanso. Eleve los pies durante 15minutos, 3 o 4veces por da. Limite la cantidad de sal en su dieta.  Evite levantar objetos pesados, use zapatos de tacones bajos y mantenga una buena postura.  Descanse con las piernas elevadas si tiene calambres o dolor de cintura.  Visite a su dentista si no lo ha hecho durante el embarazo. Use un cepillo de dientes blando para higienizarse los dientes y psese el hilo dental con suavidad.  Puede seguir manteniendo relaciones sexuales, a menos que el mdico le indique lo contrario.  No haga viajes largos excepto que sea absolutamente necesario y solo con la autorizacin del mdico.  Tome clases prenatales para entender, practicar y hacer preguntas sobre el trabajo de parto y el parto.  Haga un ensayo de la partida al hospital.  Prepare el bolso que   llevar al hospital.  Prepare la habitacin del beb.  Concurra a todas las visitas prenatales segn las indicaciones de su mdico. SOLICITE ATENCIN MDICA SI:  No est segura de que est en trabajo de parto o de que ha roto la bolsa de las aguas.  Tiene mareos.  Siente clicos leves, presin en la pelvis o dolor persistente en el abdomen.  Tiene nuseas, vmitos o diarrea persistentes.  Tiene secrecin vaginal con mal olor.  Siente dolor al orinar. SOLICITE ATENCIN MDICA DE INMEDIATO SI:   Tiene fiebre.  Tiene una prdida de lquido por la vagina.  Tiene sangrado o pequeas prdidas vaginales.  Siente dolor intenso o clicos en el abdomen.  Sube o baja de peso rpidamente.  Tiene dificultad para respirar y siente dolor de  pecho.  Sbitamente se le hinchan mucho el rostro, las manos, los tobillos, los pies o las piernas.  No ha sentido los movimientos del beb durante una hora.  Siente un dolor de cabeza intenso que no se alivia con medicamentos.  Hay cambios en la visin. Document Released: 08/09/2005 Document Revised: 11/04/2013 [employer] Patient Information 2015 [employer]. This information is not intended to replace advice given to you by your health care provider. Make sure you discuss any questions you have with your health care provider.  

## 2014-07-29 NOTE — Progress Notes (Signed)
NST reactive- pt was not aware of all FM present during NST.  BID kick count instructions given verbally and in writing (Spanish).  Pt voiced understanding.

## 2014-07-29 NOTE — Progress Notes (Signed)
30 weeks, reports further decreased fetal movement from last visit.   She denies vaginal bleeding/LOF/contractions/dysuria. NST today for decreased FM Flu shot today RTC 2 weeks for ob f/u

## 2014-07-30 LAB — CULTURE, GROUP A STREP: ORGANISM ID, BACTERIA: NORMAL

## 2014-08-12 ENCOUNTER — Encounter: Payer: Self-pay | Admitting: Advanced Practice Midwife

## 2014-08-12 ENCOUNTER — Ambulatory Visit (INDEPENDENT_AMBULATORY_CARE_PROVIDER_SITE_OTHER): Payer: Self-pay | Admitting: Advanced Practice Midwife

## 2014-08-12 VITALS — BP 108/61 | HR 83 | Wt 182.5 lb

## 2014-08-12 DIAGNOSIS — O09293 Supervision of pregnancy with other poor reproductive or obstetric history, third trimester: Secondary | ICD-10-CM | POA: Insufficient documentation

## 2014-08-12 DIAGNOSIS — O0930 Supervision of pregnancy with insufficient antenatal care, unspecified trimester: Secondary | ICD-10-CM

## 2014-08-12 DIAGNOSIS — O09299 Supervision of pregnancy with other poor reproductive or obstetric history, unspecified trimester: Secondary | ICD-10-CM

## 2014-08-12 DIAGNOSIS — Z8759 Personal history of other complications of pregnancy, childbirth and the puerperium: Secondary | ICD-10-CM | POA: Insufficient documentation

## 2014-08-12 LAB — POCT URINALYSIS DIP (DEVICE)
Bilirubin Urine: NEGATIVE
Glucose, UA: NEGATIVE mg/dL
HGB URINE DIPSTICK: NEGATIVE
KETONES UR: NEGATIVE mg/dL
Nitrite: NEGATIVE
PH: 5.5 (ref 5.0–8.0)
PROTEIN: NEGATIVE mg/dL
SPECIFIC GRAVITY, URINE: 1.015 (ref 1.005–1.030)
Urobilinogen, UA: 0.2 mg/dL (ref 0.0–1.0)

## 2014-08-12 NOTE — Patient Instructions (Signed)
Breastfeeding Deciding to breastfeed is one of the best choices you can make for you and your baby. A change in hormones during pregnancy causes your breast tissue to grow and increases the number and size of your milk ducts. These hormones also allow proteins, sugars, and fats from your blood supply to make breast milk in your milk-producing glands. Hormones prevent breast milk from being released before your baby is born as well as prompt milk flow after birth. Once breastfeeding has begun, thoughts of your baby, as well as his or her sucking or crying, can stimulate the release of milk from your milk-producing glands.  BENEFITS OF BREASTFEEDING For Your Baby  Your first milk (colostrum) helps your baby's digestive system function better.   There are antibodies in your milk that help your baby fight off infections.   Your baby has a lower incidence of asthma, allergies, and sudden infant death syndrome.   The nutrients in breast milk are better for your baby than infant formulas and are designed uniquely for your baby's needs.   Breast milk improves your baby's brain development.   Your baby is less likely to develop other conditions, such as childhood obesity, asthma, or type 2 diabetes mellitus.  For You   Breastfeeding helps to create a very special bond between you and your baby.   Breastfeeding is convenient. Breast milk is always available at the correct temperature and costs nothing.   Breastfeeding helps to burn calories and helps you lose the weight gained during pregnancy.   Breastfeeding makes your uterus contract to its prepregnancy size faster and slows bleeding (lochia) after you give birth.   Breastfeeding helps to lower your risk of developing type 2 diabetes mellitus, osteoporosis, and breast or ovarian cancer later in life. SIGNS THAT YOUR BABY IS HUNGRY Early Signs of Hunger  Increased alertness or activity.  Stretching.  Movement of the head from  side to side.  Movement of the head and opening of the mouth when the corner of the mouth or cheek is stroked (rooting).  Increased sucking sounds, smacking lips, cooing, sighing, or squeaking.  Hand-to-mouth movements.  Increased sucking of fingers or hands. Late Signs of Hunger  Fussing.  Intermittent crying. Extreme Signs of Hunger Signs of extreme hunger will require calming and consoling before your baby will be able to breastfeed successfully. Do not wait for the following signs of extreme hunger to occur before you initiate breastfeeding:   Restlessness.  A loud, strong cry.   Screaming. BREASTFEEDING BASICS Breastfeeding Initiation  Find a comfortable place to sit or lie down, with your neck and back well supported.  Place a pillow or rolled up blanket under your baby to bring him or her to the level of your breast (if you are seated). Nursing pillows are specially designed to help support your arms and your baby while you breastfeed.  Make sure that your baby's abdomen is facing your abdomen.   Gently massage your breast. With your fingertips, massage from your chest wall toward your nipple in a circular motion. This encourages milk flow. You may need to continue this action during the feeding if your milk flows slowly.  Support your breast with 4 fingers underneath and your thumb above your nipple. Make sure your fingers are well away from your nipple and your baby's mouth.   Stroke your baby's lips gently with your finger or nipple.   When your baby's mouth is open wide enough, quickly bring your baby to your  breast, placing your entire nipple and as much of the colored area around your nipple (areola) as possible into your baby's mouth.   More areola should be visible above your baby's upper lip than below the lower lip.   Your baby's tongue should be between his or her lower gum and your breast.   Ensure that your baby's mouth is correctly positioned  around your nipple (latched). Your baby's lips should create a seal on your breast and be turned out (everted).  It is common for your baby to suck about 2-3 minutes in order to start the flow of breast milk. Latching Teaching your baby how to latch on to your breast properly is very important. An improper latch can cause nipple pain and decreased milk supply for you and poor weight gain in your baby. Also, if your baby is not latched onto your nipple properly, he or she may swallow some air during feeding. This can make your baby fussy. Burping your baby when you switch breasts during the feeding can help to get rid of the air. However, teaching your baby to latch on properly is still the best way to prevent fussiness from swallowing air while breastfeeding. Signs that your baby has successfully latched on to your nipple:    Silent tugging or silent sucking, without causing you pain.   Swallowing heard between every 3-4 sucks.    Muscle movement above and in front of his or her ears while sucking.  Signs that your baby has not successfully latched on to nipple:   Sucking sounds or smacking sounds from your baby while breastfeeding.  Nipple pain. If you think your baby has not latched on correctly, slip your finger into the corner of your baby's mouth to break the suction and place it between your baby's gums. Attempt breastfeeding initiation again. Signs of Successful Breastfeeding Signs from your baby:   A gradual decrease in the number of sucks or complete cessation of sucking.   Falling asleep.   Relaxation of his or her body.   Retention of a small amount of milk in his or her mouth.   Letting go of your breast by himself or herself. Signs from you:  Breasts that have increased in firmness, weight, and size 1-3 hours after feeding.   Breasts that are softer immediately after breastfeeding.  Increased milk volume, as well as a change in milk consistency and color by  the fifth day of breastfeeding.   Nipples that are not sore, cracked, or bleeding. Signs That Your Randel Books is Getting Enough Milk  Wetting at least 3 diapers in a 24-hour period. The urine should be clear and pale yellow by age 11 days.  At least 3 stools in a 24-hour period by age 11 days. The stool should be soft and yellow.  At least 3 stools in a 24-hour period by age 18 days. The stool should be seedy and yellow.  No loss of weight greater than 10% of birth weight during the first 55 days of age.  Average weight gain of 4-7 ounces (113-198 g) per week after age 36 days.  Consistent daily weight gain by age 9 days, without weight loss after the age of 2 weeks. After a feeding, your baby may spit up a small amount. This is common. BREASTFEEDING FREQUENCY AND DURATION Frequent feeding will help you make more milk and can prevent sore nipples and breast engorgement. Breastfeed when you feel the need to reduce the fullness of your breasts  or when your baby shows signs of hunger. This is called "breastfeeding on demand." Avoid introducing a pacifier to your baby while you are working to establish breastfeeding (the first 4-6 weeks after your baby is born). After this time you may choose to use a pacifier. Research has shown that pacifier use during the first year of a baby's life decreases the risk of sudden infant death syndrome (SIDS). Allow your baby to feed on each breast as long as he or she wants. Breastfeed until your baby is finished feeding. When your baby unlatches or falls asleep while feeding from the first breast, offer the second breast. Because newborns are often sleepy in the first few weeks of life, you may need to awaken your baby to get him or her to feed. Breastfeeding times will vary from baby to baby. However, the following rules can serve as a guide to help you ensure that your baby is properly fed:  Newborns (babies 5 weeks of age or younger) may breastfeed every 1-3  hours.  Newborns should not go longer than 3 hours during the day or 5 hours during the night without breastfeeding.  You should breastfeed your baby a minimum of 8 times in a 24-hour period until you begin to introduce solid foods to your baby at around 54 months of age. BREAST MILK PUMPING Pumping and storing breast milk allows you to ensure that your baby is exclusively fed your breast milk, even at times when you are unable to breastfeed. This is especially important if you are going back to work while you are still breastfeeding or when you are not able to be present during feedings. Your lactation consultant can give you guidelines on how long it is safe to store breast milk.  A breast pump is a machine that allows you to pump milk from your breast into a sterile bottle. The pumped breast milk can then be stored in a refrigerator or freezer. Some breast pumps are operated by hand, while others use electricity. Ask your lactation consultant which type will work best for you. Breast pumps can be purchased, but some hospitals and breastfeeding support groups lease breast pumps on a monthly basis. A lactation consultant can teach you how to hand express breast milk, if you prefer not to use a pump.  CARING FOR YOUR BREASTS WHILE YOU BREASTFEED Nipples can become dry, cracked, and sore while breastfeeding. The following recommendations can help keep your breasts moisturized and healthy:  Avoid using soap on your nipples.   Wear a supportive bra. Although not required, special nursing bras and tank tops are designed to allow access to your breasts for breastfeeding without taking off your entire bra or top. Avoid wearing underwire-style bras or extremely tight bras.  Air dry your nipples for 3-98minutes after each feeding.   Use only cotton bra pads to absorb leaked breast milk. Leaking of breast milk between feedings is normal.   Use lanolin on your nipples after breastfeeding. Lanolin helps to  maintain your skin's normal moisture barrier. If you use pure lanolin, you do not need to wash it off before feeding your baby again. Pure lanolin is not toxic to your baby. You may also hand express a few drops of breast milk and gently massage that milk into your nipples and allow the milk to air dry. In the first few weeks after giving birth, some women experience extremely full breasts (engorgement). Engorgement can make your breasts feel heavy, warm, and tender to the  touch. Engorgement peaks within 3-5 days after you give birth. The following recommendations can help ease engorgement:  Completely empty your breasts while breastfeeding or pumping. You may want to start by applying warm, moist heat (in the shower or with warm water-soaked hand towels) just before feeding or pumping. This increases circulation and helps the milk flow. If your baby does not completely empty your breasts while breastfeeding, pump any extra milk after he or she is finished.  Wear a snug bra (nursing or regular) or tank top for 1-2 days to signal your body to slightly decrease milk production.  Apply ice packs to your breasts, unless this is too uncomfortable for you.  Make sure that your baby is latched on and positioned properly while breastfeeding. If engorgement persists after 48 hours of following these recommendations, contact your health care provider or a lactation consultant. OVERALL HEALTH CARE RECOMMENDATIONS WHILE BREASTFEEDING  Eat healthy foods. Alternate between meals and snacks, eating 3 of each per day. Because what you eat affects your breast milk, some of the foods may make your baby more irritable than usual. Avoid eating these foods if you are sure that they are negatively affecting your baby.  Drink milk, fruit juice, and water to satisfy your thirst (about 10 glasses a day).   Rest often, relax, and continue to take your prenatal vitamins to prevent fatigue, stress, and anemia.  Continue  breast self-awareness checks.  Avoid chewing and smoking tobacco.  Avoid alcohol and drug use. Some medicines that may be harmful to your baby can pass through breast milk. It is important to ask your health care provider before taking any medicine, including all over-the-counter and prescription medicine as well as vitamin and herbal supplements. It is possible to become pregnant while breastfeeding. If birth control is desired, ask your health care provider about options that will be safe for your baby. SEEK MEDICAL CARE IF:   You feel like you want to stop breastfeeding or have become frustrated with breastfeeding.  You have painful breasts or nipples.  Your nipples are cracked or bleeding.  Your breasts are red, tender, or warm.  You have a swollen area on either breast.  You have a fever or chills.  You have nausea or vomiting.  You have drainage other than breast milk from your nipples.  Your breasts do not become full before feedings by the fifth day after you give birth.  You feel sad and depressed.  Your baby is too sleepy to eat well.  Your baby is having trouble sleeping.   Your baby is wetting less than 3 diapers in a 24-hour period.  Your baby has less than 3 stools in a 24-hour period.  Your baby's skin or the white part of his or her eyes becomes yellow.   Your baby is not gaining weight by 5 days of age. SEEK IMMEDIATE MEDICAL CARE IF:   Your baby is overly tired (lethargic) and does not want to wake up and feed.  Your baby develops an unexplained fever. Document Released: 10/30/2005 Document Revised: 11/04/2013 Document Reviewed: 04/23/2013 Exit[employer] Patient Information 2015 [employer]. This information is not intended to replace advice given to you by your health care provider. Make sure you discuss any questions you have with your health care provider.  

## 2014-08-12 NOTE — Progress Notes (Signed)
Discussed Hx macrosomia. No complications.

## 2014-08-26 ENCOUNTER — Encounter: Payer: Self-pay | Admitting: Advanced Practice Midwife

## 2014-08-28 ENCOUNTER — Encounter (HOSPITAL_COMMUNITY): Payer: Self-pay | Admitting: *Deleted

## 2014-08-28 ENCOUNTER — Inpatient Hospital Stay (HOSPITAL_COMMUNITY)
Admission: AD | Admit: 2014-08-28 | Discharge: 2014-08-28 | Disposition: A | Discharge status: Home / Self Care | Payer: MEDICAID | Source: Ambulatory Visit | Attending: Family Medicine | Admitting: Family Medicine

## 2014-08-28 DIAGNOSIS — Z9181 History of falling: Secondary | ICD-10-CM | POA: Insufficient documentation

## 2014-08-28 DIAGNOSIS — R42 Dizziness and giddiness: Secondary | ICD-10-CM | POA: Insufficient documentation

## 2014-08-28 DIAGNOSIS — R51 Headache: Secondary | ICD-10-CM | POA: Insufficient documentation

## 2014-08-28 DIAGNOSIS — Z3483 Encounter for supervision of other normal pregnancy, third trimester: Secondary | ICD-10-CM | POA: Insufficient documentation

## 2014-08-28 DIAGNOSIS — Z3A34 34 weeks gestation of pregnancy: Secondary | ICD-10-CM | POA: Insufficient documentation

## 2014-08-28 DIAGNOSIS — O368130 Decreased fetal movements, third trimester, not applicable or unspecified: Secondary | ICD-10-CM | POA: Insufficient documentation

## 2014-08-28 DIAGNOSIS — R55 Syncope and collapse: Secondary | ICD-10-CM

## 2014-08-28 LAB — URINALYSIS, ROUTINE W REFLEX MICROSCOPIC
BILIRUBIN URINE: NEGATIVE
GLUCOSE, UA: NEGATIVE mg/dL
HGB URINE DIPSTICK: NEGATIVE
KETONES UR: 15 mg/dL — AB
Nitrite: NEGATIVE
PROTEIN: NEGATIVE mg/dL
Specific Gravity, Urine: 1.025 (ref 1.005–1.030)
UROBILINOGEN UA: 2 mg/dL — AB (ref 0.0–1.0)
pH: 6 (ref 5.0–8.0)

## 2014-08-28 LAB — CBC
HEMATOCRIT: 29.7 % — AB (ref 36.0–46.0)
Hemoglobin: 10 g/dL — ABNORMAL LOW (ref 12.0–15.0)
MCH: 27.5 pg (ref 26.0–34.0)
MCHC: 33.7 g/dL (ref 30.0–36.0)
MCV: 81.8 fL (ref 78.0–100.0)
Platelets: 141 10*3/uL — ABNORMAL LOW (ref 150–400)
RBC: 3.63 MIL/uL — ABNORMAL LOW (ref 3.87–5.11)
RDW: 14 % (ref 11.5–15.5)
WBC: 7.2 10*3/uL (ref 4.0–10.5)

## 2014-08-28 LAB — URINE MICROSCOPIC-ADD ON

## 2014-08-28 LAB — GLUCOSE, RANDOM: Glucose, Bld: 92 mg/dL (ref 70–99)

## 2014-08-28 MED ORDER — BUTALBITAL-APAP-CAFFEINE 50-325-40 MG PO TABS
1.0000 | ORAL_TABLET | Freq: Once | ORAL | Status: AC
  Administered 2014-08-28: 1 via ORAL
  Filled 2014-08-28: qty 1

## 2014-08-28 MED ORDER — BUTALBITAL-APAP-CAFFEINE 50-325-40 MG PO TABS: 1.0000 | ORAL_TABLET | Freq: Four times a day (QID) | ORAL | Status: DC | PRN

## 2014-08-28 NOTE — MAU Provider Note (Signed)
History     CSN: [id number]  Arrival date and time: 08/28/14 2054   None     Chief Complaint  Patient presents with  . Dizziness  . Fall  . Headache  . Decreased Fetal Movement   HPI [name] is a 28yo [id number] @ 34.2wks who comes in to be seen for dizziness/near-fainting while she was playing with her kids this evening. She stood, possibly quickly, and then fell to her right side. Her side/abd hit the edge of the sofa in the process. No trauma to any other areas of her body. Approx 2 hrs before had some sweet bread to eat.  Immediately afterwards she reports feeling SOB and flushed. She also reports feeling abd pressure. Denies leaking or bldg. Has not been feeling the baby move as frequently in the past couple of days. Her preg has been followed by the [facility] and has been remarkable for 1) hx macrosomia 2) mildly obese.  OB History   Grav Para Term Preterm Abortions TAB SAB Ect Mult Living   4 3 3  0 0 0 0 0 0 3      Past Medical History  Diagnosis Date  . Headache(784.0)   . Urinary tract infection     Past Surgical History  Procedure Laterality Date  . No past surgeries      Family History  Problem Relation Age of Onset  . Other Neg Hx   . Diabetes Mother   . Hypertension Maternal Grandmother   . Diabetes Maternal Grandmother   . Cancer Paternal Grandmother   . Cancer Paternal Grandfather   . Hypertension Father   . Cancer Paternal Aunt     bladder cancer   . Asthma Other   . Obesity Other   . Sleep apnea Other   . Heart disease Other     History  Substance Use Topics  . Smoking status: Never Smoker   . Smokeless tobacco: Never Used  . Alcohol Use: No    Allergies:  Allergies  Allergen Reactions  . Keflex [Cephalexin] Itching and Rash    Prescriptions prior to admission  Medication Sig Dispense Refill  . prenatal vitamin w/FE, FA (PRENATAL 1 + 1) 27-1 MG TABS tablet Take 1 tablet by mouth daily.         ROS Physical Exam   Blood  pressure 106/67, pulse 85, temperature 97.3 F (36.3 C), resp. rate 18, last menstrual period 12/31/2013, SpO2 99.00%.  Physical Exam  Constitutional: She is oriented to person, place, and time. She appears well-developed.  HENT:  Head: Normocephalic.  Cardiovascular: Normal rate.   Respiratory: Effort normal.  GI:  EFM 130s +accels, no decels, baby very active Irreg ctx; no pattern On monitor x 2hr91min  No erythema/ecchymosis on right side/abd; nontender  Genitourinary: Vagina normal.  Cx FT/thick/post  Musculoskeletal: Normal range of motion.  Neurological: She is alert and oriented to person, place, and time.  Skin: Skin is warm and dry.  Psychiatric: She has a normal mood and affect. Her behavior is normal. Thought content normal.   CBC    Component Value Date/Time   WBC 7.2 08/28/2014 2125   RBC 3.63* 08/28/2014 2125   HGB 10.0* 08/28/2014 2125   HCT 29.7* 08/28/2014 2125   PLT 141* 08/28/2014 2125   MCV 81.8 08/28/2014 2125   MCH 27.5 08/28/2014 2125   MCHC 33.7 08/28/2014 2125   RDW 14.0 08/28/2014 2125   LYMPHSABS 1.6 06/10/2014 1314   MONOABS 0.3 06/10/2014 1314  EOSABS 0.1 06/10/2014 1314   BASOSABS 0.0 06/10/2014 1314   Urinalysis    Component Value Date/Time   COLORURINE YELLOW 08/28/2014 2130   APPEARANCEUR CLOUDY* 08/28/2014 2130   LABSPEC 1.025 08/28/2014 2130   PHURINE 6.0 08/28/2014 2130   GLUCOSEU NEGATIVE 08/28/2014 2130   HGBUR NEGATIVE 08/28/2014 2130   BILIRUBINUR NEGATIVE 08/28/2014 2130   KETONESUR 15* 08/28/2014 2130   PROTEINUR NEGATIVE 08/28/2014 2130   UROBILINOGEN 2.0* 08/28/2014 2130   NITRITE NEGATIVE 08/28/2014 2130   LEUKOCYTESUR SMALL* 08/28/2014 2130    Random glucose: 92  MAU Course  Procedures  Given Fioricet while here for H/A- relieved.  Assessment and Plan  IUP @ 34.2wks Near syncope, possibly vasovagal Decreased FM  D/C home  Rx Fioricet given for H/As Rec increasing protein intake, spaced throughout the day,  and changing positions slowly F/U as sched at next [facility] appt  Serita Grammes CNM 08/28/2014, 10:50 PM

## 2014-08-28 NOTE — MAU Note (Signed)
About 70mins ago i felt dizzy and fell. Hit my R side on sofa. Have not felt much FM since yesterday. Some pelvic pressure. Leaking some fld today. No bleeding. Have a headache

## 2014-08-28 NOTE — Progress Notes (Signed)
Derrill Memo CNM notified of pt's admission and status. Orders received.

## 2014-08-28 NOTE — Discharge Instructions (Signed)
Presncope (Near-Syncope) El presncope (comnmente llamado "casi desmayo") es un estado de debilidad repentina, Tree surgeon o sensacin de que la persona va a Garment/textile technologist. Durante un episodio de presncope, tambin puede tener la piel plida, visin en tnel o ganas de vomitar (nuseas). El presncope puede ocurrir al levantarse de una silla o al Public affairs consultant de pie durante [place]. La causa es una disminucin sbita del flujo de sangre al cerebro. Esta disminucin puede ser el resultado de varias causas o disparadores, la [place] de las cuales no son graves. Sin embargo, debido a que a Archivist puede ser un signo de una afeccin grave, es necesaria una evaluacin mdica. Generalmente la causa especfica no puede determinarse. INSTRUCCIONES PARA EL CUIDADO EN EL HOGAR  Controle su afeccin para ver si hay cambios. Las siguientes indicaciones ayudarn a Writer [employer] pueda sentir:  Pdale a alguien que se quede con usted hasta que se sienta estable.  Recustese inmediatamente y [employer] piernas si siente que va a desmayarse. Respire profundamente y de [place] continua. Espere hasta que los sntomas hayan desaparecido. La mayor parte de los episodios dura unos pocos minutos. Podr sentirse cansado por varias horas.  Beba suficiente lquido para Consulting civil engineer orina clara o de color amarillo plido.  Si toma medicamentos para la presin arterial o para el corazn, levntese lentamente si est sentado o acostado. Tmese algunos minutos para permanecer sentado y luego prese. Esto puede reducir [employer].  Concurra a las consultas de control con su mdico segn las indicaciones. SOLICITE ATENCIN MDICA DE INMEDIATO SI:   Sufre un dolor intenso de [place].  Siente un dolor intenso inusual en el pecho, el abdomen o la espalda.  Tiene un sangrado por la boca o el recto, o la materia fecal es de color negro o aspecto alquitranado.  Siente latidos irregulares o muy  rpidos.  Sufre episodios de Dontrell Stuck-Clark repetidos o temblores como sacudidas durante un episodio.  Se desmaya mientras se encuentra sentado o acostado.  Se siente confundido.  Tiene problemas para caminar.  Siente debilidad intensa.  Tiene problemas de visin. ASEGRESE DE QUE:   Comprende estas instrucciones.  Controlar su afeccin.  Recibir ayuda de inmediato si no mejora o si empeora. Document Released: 10/30/2005 Document Revised: 11/04/2013 [facility] Patient Information 2015 [place]. This information is not intended to replace advice given to you by your health care provider. Make sure you discuss any questions you have with your health care provider.

## 2014-08-29 NOTE — MAU Provider Note (Signed)
Attestation of Attending Supervision of Advanced Practitioner (PA/CNM/NP): Evaluation and management procedures were performed by the Advanced Practitioner under my supervision and collaboration.  I have reviewed the Advanced Practitioner's note and chart, and I agree with the management and plan.  Jacob Stinson, DO Attending Physician Faculty Practice, [facility]  

## 2014-09-04 ENCOUNTER — Ambulatory Visit (INDEPENDENT_AMBULATORY_CARE_PROVIDER_SITE_OTHER): Payer: Self-pay | Admitting: Advanced Practice Midwife

## 2014-09-04 VITALS — BP 106/58 | HR 97 | Temp 98.2°F | Wt 186.3 lb

## 2014-09-04 DIAGNOSIS — Z3483 Encounter for supervision of other normal pregnancy, third trimester: Secondary | ICD-10-CM

## 2014-09-04 DIAGNOSIS — Z3493 Encounter for supervision of normal pregnancy, unspecified, third trimester: Secondary | ICD-10-CM | POA: Insufficient documentation

## 2014-09-04 LAB — POCT URINALYSIS DIP (DEVICE)
Bilirubin Urine: NEGATIVE
GLUCOSE, UA: NEGATIVE mg/dL
HGB URINE DIPSTICK: NEGATIVE
KETONES UR: NEGATIVE mg/dL
Nitrite: NEGATIVE
Protein, ur: NEGATIVE mg/dL
Specific Gravity, Urine: <=1.005 (ref 1.005–1.030)
UROBILINOGEN UA: 0.2 mg/dL (ref 0.0–1.0)
pH: 6 (ref 5.0–8.0)

## 2014-09-04 NOTE — Progress Notes (Signed)
C/o diarrhea past 3 days.

## 2014-09-04 NOTE — Patient Instructions (Signed)
Braxton Hicks Contractions Contractions of the uterus can occur throughout pregnancy. Contractions are not always a sign that you are in labor.  WHAT ARE BRAXTON HICKS CONTRACTIONS?  Contractions that occur before labor are called Braxton Hicks contractions, or false labor. Toward the end of pregnancy (32-34 weeks), these contractions can develop more often and may become more forceful. This is not true labor because these contractions do not result in opening (dilatation) and thinning of the cervix. They are sometimes difficult to tell apart from true labor because these contractions can be forceful and people have different pain tolerances. You should not feel embarrassed if you go to the hospital with false labor. Sometimes, the only way to tell if you are in true labor is for your health care provider to look for changes in the cervix. If there are no prenatal problems or other health problems associated with the pregnancy, it is completely safe to be sent home with false labor and await the onset of true labor. HOW CAN YOU TELL THE DIFFERENCE BETWEEN TRUE AND FALSE LABOR? False Labor  The contractions of false labor are usually shorter and not as hard as those of true labor.   The contractions are usually irregular.   The contractions are often felt in the front of the lower abdomen and in the groin.   The contractions may go away when you walk around or change positions while lying down.   The contractions get weaker and are shorter lasting as time goes on.   The contractions do not usually become progressively stronger, regular, and closer together as with true labor.  True Labor  Contractions in true labor last 30-70 seconds, become very regular, usually become more intense, and increase in frequency.   The contractions do not go away with walking.   The discomfort is usually felt in the top of the uterus and spreads to the lower abdomen and low back.   True labor can be  determined by your health care provider with an exam. This will show that the cervix is dilating and getting thinner.  WHAT TO REMEMBER  Keep up with your usual exercises and follow other instructions given by your health care provider.   Take medicines as directed by your health care provider.   Keep your regular prenatal appointments.   Eat and drink lightly if you think you are going into labor.   If Braxton Hicks contractions are making you uncomfortable:   Change your position from lying down or resting to walking, or from walking to resting.   Sit and rest in a tub of warm water.   Drink 2-3 glasses of water. Dehydration may cause these contractions.   Do slow and deep breathing several times an hour.  WHEN SHOULD I SEEK IMMEDIATE MEDICAL CARE? Seek immediate medical care if:  Your contractions become stronger, more regular, and closer together.   You have fluid leaking or gushing from your vagina.   You have a fever.   You pass blood-tinged mucus.   You have vaginal bleeding.   You have continuous abdominal pain.   You have low back pain that you never had before.   You feel your baby's head pushing down and causing pelvic pressure.   Your baby is not moving as much as it used to.  Document Released: 10/30/2005 Document Revised: 11/04/2013 Document Reviewed: 08/11/2013 Exit[employer] Patient Information 2015 [employer]. This information is not intended to replace advice given to you by your health care   provider. Make sure you discuss any questions you have with your health care provider.  Diarrhea Diarrhea is frequent loose and watery bowel movements. It can cause you to feel weak and dehydrated. Dehydration can cause you to become tired and thirsty, have a dry mouth, and have decreased urination that often is dark yellow. Diarrhea is a sign of another problem, most often an infection that will not last long. In most cases, diarrhea typically lasts  2-3 days. However, it can last longer if it is a sign of something more serious. It is important to treat your diarrhea as directed by your caregiver to lessen or prevent future episodes of diarrhea. CAUSES  Some common causes include:  Gastrointestinal infections caused by viruses, bacteria, or parasites.  Food poisoning or food allergies.  Certain medicines, such as antibiotics, chemotherapy, and laxatives.  Artificial sweeteners and fructose.  Digestive disorders. HOME CARE INSTRUCTIONS  Ensure adequate fluid intake (hydration): Have 1 cup (8 oz) of fluid for each diarrhea episode. Avoid fluids that contain simple sugars or sports drinks, fruit juices, whole milk products, and sodas. Your urine should be clear or pale yellow if you are drinking enough fluids. Hydrate with an oral rehydration solution that you can purchase at pharmacies, retail stores, and online. You can prepare an oral rehydration solution at home by mixing the following ingredients together:   - tsp table salt.   tsp baking soda.   tsp salt substitute containing potassium chloride.  1  tablespoons sugar.  1 L (34 oz) of water.  Certain foods and beverages may increase the speed at which food moves through the gastrointestinal (GI) tract. These foods and beverages should be avoided and include:  Caffeinated and alcoholic beverages.  High-fiber foods, such as raw fruits and vegetables, nuts, seeds, and whole grain breads and cereals.  Foods and beverages sweetened with sugar alcohols, such as xylitol, sorbitol, and mannitol.  Some foods may be well tolerated and may help thicken stool including:  Starchy foods, such as rice, toast, pasta, low-sugar cereal, oatmeal, grits, baked potatoes, crackers, and bagels.  Bananas.  Applesauce.  Add probiotic-rich foods to help increase healthy bacteria in the GI tract, such as yogurt and fermented milk products.  Wash your hands well after each diarrhea  episode.  Only take over-the-counter or prescription medicines as directed by your caregiver.  Take a warm bath to relieve any burning or pain from frequent diarrhea episodes. SEEK IMMEDIATE MEDICAL CARE IF:   You are unable to keep fluids down.  You have persistent vomiting.  You have blood in your stool, or your stools are black and tarry.  You do not urinate in 6-8 hours, or there is only a small amount of very dark urine.  You have abdominal pain that increases or localizes.  You have weakness, dizziness, confusion, or light-headedness.  You have a severe headache.  Your diarrhea gets worse or does not get better.  You have a fever or persistent symptoms for more than 2-3 days.  You have a fever and your symptoms suddenly get worse. MAKE SURE YOU:   Understand these instructions.  Will watch your condition.  Will get help right away if you are not doing well or get worse. Document Released: 10/20/2002 Document Revised: 03/16/2014 Document Reviewed: 07/07/2012 [facility] Patient Information 2015 [place], [place]. This information is not intended to replace advice given to you by your health care provider. Make sure you discuss any questions you have with your health care provider.

## 2014-09-04 NOTE — Progress Notes (Signed)
C/O diarrhea and LBP. No vomiting, fever. Rec Imodium PRN for frequent diarrhea. Baby OP--likely cause LBP. PTL precautions.

## 2014-09-11 ENCOUNTER — Ambulatory Visit (INDEPENDENT_AMBULATORY_CARE_PROVIDER_SITE_OTHER): Payer: Self-pay | Admitting: Family Medicine

## 2014-09-11 ENCOUNTER — Other Ambulatory Visit: Payer: Self-pay | Admitting: Family Medicine

## 2014-09-11 VITALS — BP 112/66 | HR 87 | Temp 97.8°F | Wt 186.7 lb

## 2014-09-11 DIAGNOSIS — Z3483 Encounter for supervision of other normal pregnancy, third trimester: Secondary | ICD-10-CM

## 2014-09-11 DIAGNOSIS — Z3493 Encounter for supervision of normal pregnancy, unspecified, third trimester: Secondary | ICD-10-CM

## 2014-09-11 LAB — POCT URINALYSIS DIP (DEVICE)
BILIRUBIN URINE: NEGATIVE
GLUCOSE, UA: NEGATIVE mg/dL
Hgb urine dipstick: NEGATIVE
Ketones, ur: NEGATIVE mg/dL
NITRITE: NEGATIVE
Protein, ur: NEGATIVE mg/dL
Specific Gravity, Urine: 1.02 (ref 1.005–1.030)
Urobilinogen, UA: 1 mg/dL (ref 0.0–1.0)
pH: 6 (ref 5.0–8.0)

## 2014-09-11 LAB — OB RESULTS CONSOLE GC/CHLAMYDIA
Chlamydia: NEGATIVE
Gonorrhea: NEGATIVE

## 2014-09-11 LAB — OB RESULTS CONSOLE GBS: GBS: NEGATIVE

## 2014-09-11 MED ORDER — LOPERAMIDE HCL 2 MG PO CAPS: 2.0000 mg | ORAL_CAPSULE | ORAL | Status: DC | PRN

## 2014-09-11 NOTE — Addendum Note (Signed)
Addended by: Truett Mainland on: 09/11/2014 08:46 AM   Modules accepted: Orders

## 2014-09-11 NOTE — Progress Notes (Signed)
Patient without complaints.  Denies vaginal bleeding, abnormal vaginal discharge, contractions, loss of fluid.  Reports good fetal activity.  Labor precautions reviewed.  Follow up in 1 weeks.

## 2014-09-11 NOTE — Patient Instructions (Signed)
Tercer trimestre del embarazo  (Third Trimester of Pregnancy)  El tercer trimestre del embarazo abarca desde la semana 29 hasta la semana 42, desde el 7 mes hasta el 9. En este trimestre el beb (feto) se desarrolla muy rpidamente. Hacia el final del noveno mes, el beb que an no ha nacido mide alrededor de 20 pulgadas (45 cm) de largo. Y pesa entre 6 y 10 libras (2,700 y 4,500 kg).  CUIDADOS EN EL HOGAR   Evite fumar, consumir hierbas y beber alcohol. Evite los frmacos que no apruebe el mdico.  Slo tome los medicamentos que le haya indicado su mdico. Algunos medicamentos son seguros para tomar durante el embarazo y otros no lo son.  Haga ejercicios slo como le indique el mdico. Deje de hacer ejercicios si comienza a tener clicos.  Haga comidas regulares y sanas.  Use un sostn que le brinde buen soporte si sus mamas estn sensibles.  No utilice la baera con agua caliente, baos turcos y saunas.  Colquese el cinturn de seguridad cuando conduzca.  Evite comer carne cruda y el contacto con los utensilios y desperdicios de los gatos.  Tome las vitaminas indicadas para la etapa prenatal.  Trate de tomar medicamentos para mover el intestino (laxantes) segn lo necesario y si su mdico la autoriza. Consuma ms fibra comiendo frutas y vegetales frescos y granos enteros. Beba gran cantidad de lquido para mantener el pis (orina) de tono claro o amarillo plido.  Tome baos de agua tibia (baos de asiento) para calmar el dolor o las molestias causadas por las hemorroides. Use una crema para las hemorroides si el mdico la autoriza.  Si tiene venas hinchadas y abultadas (venas varicosas), use medias de soporte. Eleve (levante) los pies durante 15 minutos, 3 o 4 veces por da. Limite el consumo de sal en su dieta.  Evite levantar objetos pesados, usar tacones altos y sintese derecha.  Descanse con las piernas elevadas si tiene calambres o dolor de cintura.  Visite a su dentista  si no lo ha hecho durante el embarazo. Use un cepillo de dientes blando para higienizarse los dientes. Use suavemente el hilo dental.  Puede tener sexo (relaciones sexuales) siempre que el mdico la autorice.  No viaje por largas distancias si puede evitarlo. Slo hgalo con la aprobacin de su mdico.  Haga el curso pre parto.  Practique conducir hasta el hospital.  Prepare el bolso que llevar.  Prepare la habitacin del beb.  Concurra a los controles mdicos. SOLICITE AYUDA SI:   No est segura si est en trabajo de parto o ha roto la bolsa de aguas.  Tiene mareos.  Siente clicos intensos o presin en la zona baja del vientre (abdomen).  Siente un dolor persistente en la zona del vientre.  Tiene malestar estomacal (nuseas), devuelve (vomita), o tiene deposiciones acuosas (diarrea).  Advierte un olor ftido que proviene de la vagina.  Siente dolor al hacer pis (orinar). SOLICITE AYUDA DE INMEDIATO SI:   Tiene fiebre.  Pierde lquido o sangre por la vagina.  Tiene sangrando o pequeas prdidas vaginales.  Siente dolor intenso o clicos en el abdomen.  Sube o baja de peso rpidamente.  Tiene dificultad para respirar o siente dolor en el pecho.  Sbitamente se le hinchan el rostro, las manos, los tobillos, los pies o las piernas.  No ha sentido los movimientos del beb durante una hora.  Siente un dolor de cabeza intenso que no se alivia con medicamentos.  Su visin se modifica. Document   Released: 07/02/2013 Exit[employer] Patient Information 2015 [employer]. This information is not intended to replace advice given to you by your health care provider. Make sure you discuss any questions you have with your health care provider.  

## 2014-09-11 NOTE — Progress Notes (Signed)
Edema-hands/feet   Pain-left side

## 2014-09-11 NOTE — Addendum Note (Signed)
Addended by: Truett Mainland on: 09/11/2014 08:45 AM   Modules accepted: Orders

## 2014-09-12 LAB — GC/CHLAMYDIA PROBE AMP
CT PROBE, AMP APTIMA: NEGATIVE
GC Probe RNA: NEGATIVE

## 2014-09-13 LAB — CULTURE, BETA STREP (GROUP B ONLY)

## 2014-09-14 ENCOUNTER — Encounter (HOSPITAL_COMMUNITY): Payer: Self-pay | Admitting: *Deleted

## 2014-09-18 ENCOUNTER — Ambulatory Visit (INDEPENDENT_AMBULATORY_CARE_PROVIDER_SITE_OTHER): Payer: Self-pay | Admitting: Physician Assistant

## 2014-09-18 VITALS — BP 110/69 | HR 84 | Temp 98.5°F | Wt 189.8 lb

## 2014-09-18 DIAGNOSIS — Z3483 Encounter for supervision of other normal pregnancy, third trimester: Secondary | ICD-10-CM

## 2014-09-18 LAB — POCT URINALYSIS DIP (DEVICE)
Bilirubin Urine: NEGATIVE
GLUCOSE, UA: NEGATIVE mg/dL
Hgb urine dipstick: NEGATIVE
KETONES UR: NEGATIVE mg/dL
Nitrite: NEGATIVE
PROTEIN: NEGATIVE mg/dL
SPECIFIC GRAVITY, URINE: 1.025 (ref 1.005–1.030)
UROBILINOGEN UA: 1 mg/dL (ref 0.0–1.0)
pH: 6 (ref 5.0–8.0)

## 2014-09-18 NOTE — Patient Instructions (Signed)
Third Trimester of Pregnancy The third trimester is from week 29 through week 42, months 7 through 9. The third trimester is a time when the fetus is growing rapidly. At the end of the ninth month, the fetus is about 20 inches in length and weighs 6-10 pounds.  BODY CHANGES Your body goes through many changes during pregnancy. The changes vary from woman to woman.   Your weight will continue to increase. You can expect to gain 25-35 pounds (11-16 kg) by the end of the pregnancy.  You may begin to get stretch marks on your hips, abdomen, and breasts.  You may urinate more often because the fetus is moving lower into your pelvis and pressing on your bladder.  You may develop or continue to have heartburn as a result of your pregnancy.  You may develop constipation because certain hormones are causing the muscles that push waste through your intestines to slow down.  You may develop hemorrhoids or swollen, bulging veins (varicose veins).  You may have pelvic pain because of the weight gain and pregnancy hormones relaxing your joints between the bones in your pelvis. Backaches may result from overexertion of the muscles supporting your posture.  You may have changes in your hair. These can include thickening of your hair, rapid growth, and changes in texture. Some women also have hair loss during or after pregnancy, or hair that feels dry or thin. Your hair will most likely return to normal after your baby is born.  Your breasts will continue to grow and be tender. A yellow discharge may leak from your breasts called colostrum.  Your belly button may stick out.  You may feel short of breath because of your expanding uterus.  You may notice the fetus "dropping," or moving lower in your abdomen.  You may have a bloody mucus discharge. This usually occurs a few days to a week before labor begins.  Your cervix becomes thin and soft (effaced) near your due date. WHAT TO EXPECT AT YOUR PRENATAL  EXAMS  You will have prenatal exams every 2 weeks until week 36. Then, you will have weekly prenatal exams. During a routine prenatal visit:  You will be weighed to make sure you and the fetus are growing normally.  Your blood pressure is taken.  Your abdomen will be measured to track your baby's growth.  The fetal heartbeat will be listened to.  Any test results from the previous visit will be discussed.  You may have a cervical check near your due date to see if you have effaced. At around 36 weeks, your caregiver will check your cervix. At the same time, your caregiver will also perform a test on the secretions of the vaginal tissue. This test is to determine if a type of bacteria, Group B streptococcus, is present. Your caregiver will explain this further. Your caregiver may ask you:  What your birth plan is.  How you are feeling.  If you are feeling the baby move.  If you have had any abnormal symptoms, such as leaking fluid, bleeding, severe headaches, or abdominal cramping.  If you have any questions. Other tests or screenings that may be performed during your third trimester include:  Blood tests that check for low iron levels (anemia).  Fetal testing to check the health, activity level, and growth of the fetus. Testing is done if you have certain medical conditions or if there are problems during the pregnancy. FALSE LABOR You may feel small, irregular contractions that   eventually go away. These are called Braxton Hicks contractions, or false labor. Contractions may last for hours, days, or even weeks before true labor sets in. If contractions come at regular intervals, intensify, or become painful, it is best to be seen by your caregiver.  SIGNS OF LABOR   Menstrual-like cramps.  Contractions that are 5 minutes apart or less.  Contractions that start on the top of the uterus and spread down to the lower abdomen and back.  A sense of increased pelvic pressure or back  pain.  A watery or bloody mucus discharge that comes from the vagina. If you have any of these signs before the 37th week of pregnancy, call your caregiver right away. You need to go to the hospital to get checked immediately. HOME CARE INSTRUCTIONS   Avoid all smoking, herbs, alcohol, and unprescribed drugs. These chemicals affect the formation and growth of the baby.  Follow your caregiver's instructions regarding medicine use. There are medicines that are either safe or unsafe to take during pregnancy.  Exercise only as directed by your caregiver. Experiencing uterine cramps is a good sign to stop exercising.  Continue to eat regular, healthy meals.  Wear a good support bra for breast tenderness.  Do not use hot tubs, steam rooms, or saunas.  Wear your seat belt at all times when driving.  Avoid raw meat, uncooked cheese, cat litter boxes, and soil used by cats. These carry germs that can cause birth defects in the baby.  Take your prenatal vitamins.  Try taking a stool softener (if your caregiver approves) if you develop constipation. Eat more high-fiber foods, such as fresh vegetables or fruit and whole grains. Drink plenty of fluids to keep your urine clear or pale yellow.  Take warm sitz baths to soothe any pain or discomfort caused by hemorrhoids. Use hemorrhoid cream if your caregiver approves.  If you develop varicose veins, wear support hose. Elevate your feet for 15 minutes, 3-4 times a day. Limit salt in your diet.  Avoid heavy lifting, wear low heal shoes, and practice good posture.  Rest a lot with your legs elevated if you have leg cramps or low back pain.  Visit your dentist if you have not gone during your pregnancy. Use a soft toothbrush to brush your teeth and be gentle when you floss.  A sexual relationship may be continued unless your caregiver directs you otherwise.  Do not travel far distances unless it is absolutely necessary and only with the approval  of your caregiver.  Take prenatal classes to understand, practice, and ask questions about the labor and delivery.  Make a trial run to the hospital.  Pack your hospital bag.  Prepare the baby's nursery.  Continue to go to all your prenatal visits as directed by your caregiver. SEEK MEDICAL CARE IF:  You are unsure if you are in labor or if your water has broken.  You have dizziness.  You have mild pelvic cramps, pelvic pressure, or nagging pain in your abdominal area.  You have persistent nausea, vomiting, or diarrhea.  You have a bad smelling vaginal discharge.  You have pain with urination. SEEK IMMEDIATE MEDICAL CARE IF:   You have a fever.  You are leaking fluid from your vagina.  You have spotting or bleeding from your vagina.  You have severe abdominal cramping or pain.  You have rapid weight loss or gain.  You have shortness of breath with chest pain.  You notice sudden or extreme swelling   of your face, hands, ankles, feet, or legs.  You have not felt your baby move in over an hour.  You have severe headaches that do not go away with medicine.  You have vision changes. Document Released: 10/24/2001 Document Revised: 11/04/2013 Document Reviewed: 12/31/2012 Exit[employer] Patient Information 2015 [employer]. This information is not intended to replace advice given to you by your health care provider. Make sure you discuss any questions you have with your health care provider.  

## 2014-09-18 NOTE — Progress Notes (Signed)
37 weeks.  No complaints.  Denies vaginal bleeding, abnormal vaginal discharge, contractions, LOF.  Endorses good fetal movement.   Labor precautions.   PNV QD RTC 1 week.

## 2014-09-28 ENCOUNTER — Ambulatory Visit (INDEPENDENT_AMBULATORY_CARE_PROVIDER_SITE_OTHER): Payer: Self-pay | Admitting: Advanced Practice Midwife

## 2014-09-28 VITALS — BP 107/67 | HR 81 | Temp 98.4°F | Wt 192.9 lb

## 2014-09-28 DIAGNOSIS — O368131 Decreased fetal movements, third trimester, fetus 1: Secondary | ICD-10-CM

## 2014-09-28 DIAGNOSIS — O368130 Decreased fetal movements, third trimester, not applicable or unspecified: Secondary | ICD-10-CM

## 2014-09-28 DIAGNOSIS — R195 Other fecal abnormalities: Secondary | ICD-10-CM

## 2014-09-28 LAB — POCT URINALYSIS DIP (DEVICE)
BILIRUBIN URINE: NEGATIVE
GLUCOSE, UA: NEGATIVE mg/dL
Hgb urine dipstick: NEGATIVE
Ketones, ur: NEGATIVE mg/dL
NITRITE: NEGATIVE
Protein, ur: NEGATIVE mg/dL
Specific Gravity, Urine: >=1.03 (ref 1.005–1.030)
Urobilinogen, UA: 1 mg/dL (ref 0.0–1.0)
pH: 6 (ref 5.0–8.0)

## 2014-09-28 MED ORDER — RANITIDINE HCL 150 MG PO TABS: 150.0000 mg | ORAL_TABLET | Freq: Two times a day (BID) | ORAL | Status: DC

## 2014-09-28 NOTE — Progress Notes (Signed)
Pt felt good FM during NST

## 2014-09-28 NOTE — Progress Notes (Signed)
Baby has not moved since 0630

## 2014-09-28 NOTE — Progress Notes (Signed)
Doing well.  Good fetal movement since arrival in clinic this afternoon and since NST, denies vaginal bleeding, LOF, regular contractions.  Reports diarrhea 2-5x/day x 2 weeks.  No fever or sick contacts.  No blood in stool.  Some nausea but vomiting only 1x in last week.  Pt taking imodium only once per day.  May take imodium up to 4x/day for a few days.  Recommend probiotic or yogurt daily.  If diarrhea persists, call clinic or come to MAU for evaluation of infection.  Daily heartburn.  Zantac 150 mg BID sent to pharmacy.

## 2014-09-30 ENCOUNTER — Encounter (HOSPITAL_COMMUNITY): Payer: Self-pay | Admitting: *Deleted

## 2014-09-30 ENCOUNTER — Inpatient Hospital Stay (HOSPITAL_COMMUNITY)
Admission: AD | Admit: 2014-09-30 | Discharge: 2014-10-02 | DRG: 775 | Disposition: A | Discharge status: Home / Self Care | Payer: Medicaid Other | Source: Ambulatory Visit | Attending: Obstetrics and Gynecology | Admitting: Obstetrics and Gynecology

## 2014-09-30 DIAGNOSIS — Z3493 Encounter for supervision of normal pregnancy, unspecified, third trimester: Secondary | ICD-10-CM

## 2014-09-30 DIAGNOSIS — IMO0001 Reserved for inherently not codable concepts without codable children: Secondary | ICD-10-CM

## 2014-09-30 DIAGNOSIS — Z833 Family history of diabetes mellitus: Secondary | ICD-10-CM

## 2014-09-30 DIAGNOSIS — Z8249 Family history of ischemic heart disease and other diseases of the circulatory system: Secondary | ICD-10-CM | POA: Diagnosis not present

## 2014-09-30 DIAGNOSIS — IMO0002 Reserved for concepts with insufficient information to code with codable children: Secondary | ICD-10-CM

## 2014-09-30 DIAGNOSIS — O09293 Supervision of pregnancy with other poor reproductive or obstetric history, third trimester: Secondary | ICD-10-CM

## 2014-09-30 DIAGNOSIS — O0930 Supervision of pregnancy with insufficient antenatal care, unspecified trimester: Secondary | ICD-10-CM

## 2014-09-30 DIAGNOSIS — O471 False labor at or after 37 completed weeks of gestation: Secondary | ICD-10-CM | POA: Diagnosis present

## 2014-09-30 DIAGNOSIS — Z3A39 39 weeks gestation of pregnancy: Secondary | ICD-10-CM | POA: Diagnosis present

## 2014-09-30 LAB — HIV ANTIBODY (ROUTINE TESTING W REFLEX): HIV 1&2 Ab, 4th Generation: NONREACTIVE

## 2014-09-30 LAB — TYPE AND SCREEN
ABO/RH(D): O POS
ANTIBODY SCREEN: NEGATIVE

## 2014-09-30 LAB — CBC
HCT: 31 % — ABNORMAL LOW (ref 36.0–46.0)
Hemoglobin: 10.1 g/dL — ABNORMAL LOW (ref 12.0–15.0)
MCH: 25.6 pg — ABNORMAL LOW (ref 26.0–34.0)
MCHC: 32.6 g/dL (ref 30.0–36.0)
MCV: 78.5 fL (ref 78.0–100.0)
PLATELETS: 184 10*3/uL (ref 150–400)
RBC: 3.95 MIL/uL (ref 3.87–5.11)
RDW: 14.7 % (ref 11.5–15.5)
WBC: 8.2 10*3/uL (ref 4.0–10.5)

## 2014-09-30 LAB — RPR

## 2014-09-30 MED ORDER — DIBUCAINE 1 % RE OINT: 1.0000 "application " | TOPICAL_OINTMENT | RECTAL | Status: DC | PRN

## 2014-09-30 MED ORDER — OXYCODONE-ACETAMINOPHEN 5-325 MG PO TABS
2.0000 | ORAL_TABLET | ORAL | Status: DC | PRN
  Administered 2014-10-01: 2 via ORAL
  Filled 2014-09-30: qty 2

## 2014-09-30 MED ORDER — PRENATAL MULTIVITAMIN CH
1.0000 | ORAL_TABLET | Freq: Every day | ORAL | Status: DC
  Administered 2014-10-01 – 2014-10-02 (×2): 1 via ORAL
  Filled 2014-09-30 (×2): qty 1

## 2014-09-30 MED ORDER — ONDANSETRON HCL 4 MG/2ML IJ SOLN: 4.0000 mg | Freq: Four times a day (QID) | INTRAMUSCULAR | Status: DC | PRN

## 2014-09-30 MED ORDER — FLEET ENEMA 7-19 GM/118ML RE ENEM: 1.0000 | ENEMA | RECTAL | Status: DC | PRN

## 2014-09-30 MED ORDER — OXYCODONE-ACETAMINOPHEN 5-325 MG PO TABS: 1.0000 | ORAL_TABLET | ORAL | Status: DC | PRN

## 2014-09-30 MED ORDER — LIDOCAINE HCL (PF) 1 % IJ SOLN
30.0000 mL | INTRAMUSCULAR | Status: DC | PRN
  Filled 2014-09-30: qty 30

## 2014-09-30 MED ORDER — ONDANSETRON HCL 4 MG PO TABS: 4.0000 mg | ORAL_TABLET | ORAL | Status: DC | PRN

## 2014-09-30 MED ORDER — TETANUS-DIPHTH-ACELL PERTUSSIS 5-2.5-18.5 LF-MCG/0.5 IM SUSP: 0.5000 mL | Freq: Once | INTRAMUSCULAR | Status: DC

## 2014-09-30 MED ORDER — OXYTOCIN 40 UNITS IN LACTATED RINGERS INFUSION - SIMPLE MED
62.5000 mL/h | INTRAVENOUS | Status: DC
  Filled 2014-09-30: qty 1000

## 2014-09-30 MED ORDER — WITCH HAZEL-GLYCERIN EX PADS: 1.0000 "application " | MEDICATED_PAD | CUTANEOUS | Status: DC | PRN

## 2014-09-30 MED ORDER — DIPHENHYDRAMINE HCL 25 MG PO CAPS: 25.0000 mg | ORAL_CAPSULE | Freq: Four times a day (QID) | ORAL | Status: DC | PRN

## 2014-09-30 MED ORDER — ZOLPIDEM TARTRATE 5 MG PO TABS: 5.0000 mg | ORAL_TABLET | Freq: Every evening | ORAL | Status: DC | PRN

## 2014-09-30 MED ORDER — BENZOCAINE-MENTHOL 20-0.5 % EX AERO
1.0000 "application " | INHALATION_SPRAY | CUTANEOUS | Status: DC | PRN
  Administered 2014-10-01: 1 via TOPICAL
  Filled 2014-09-30: qty 56

## 2014-09-30 MED ORDER — ONDANSETRON HCL 4 MG/2ML IJ SOLN
4.0000 mg | INTRAMUSCULAR | Status: DC | PRN
Start: 2014-09-30 — End: 2014-10-02

## 2014-09-30 MED ORDER — LACTATED RINGERS IV SOLN
INTRAVENOUS | Status: DC
  Administered 2014-09-30: 12:00:00 via INTRAVENOUS

## 2014-09-30 MED ORDER — OXYCODONE-ACETAMINOPHEN 5-325 MG PO TABS
2.0000 | ORAL_TABLET | ORAL | Status: DC | PRN
  Administered 2014-09-30: 2 via ORAL
  Filled 2014-09-30: qty 2

## 2014-09-30 MED ORDER — SENNOSIDES-DOCUSATE SODIUM 8.6-50 MG PO TABS
2.0000 | ORAL_TABLET | ORAL | Status: DC
  Administered 2014-10-01: 2 via ORAL
  Filled 2014-09-30 (×2): qty 2

## 2014-09-30 MED ORDER — LANOLIN HYDROUS EX OINT: TOPICAL_OINTMENT | CUTANEOUS | Status: DC | PRN

## 2014-09-30 MED ORDER — SIMETHICONE 80 MG PO CHEW
80.0000 mg | CHEWABLE_TABLET | ORAL | Status: DC | PRN
Start: 2014-09-30 — End: 2014-10-02

## 2014-09-30 MED ORDER — ACETAMINOPHEN 325 MG PO TABS: 650.0000 mg | ORAL_TABLET | ORAL | Status: DC | PRN

## 2014-09-30 MED ORDER — IBUPROFEN 600 MG PO TABS
600.0000 mg | ORAL_TABLET | Freq: Four times a day (QID) | ORAL | Status: DC
  Administered 2014-09-30 – 2014-10-02 (×8): 600 mg via ORAL
  Filled 2014-09-30 (×8): qty 1

## 2014-09-30 MED ORDER — LACTATED RINGERS IV SOLN: 500.0000 mL | INTRAVENOUS | Status: DC | PRN

## 2014-09-30 MED ORDER — OXYTOCIN BOLUS FROM INFUSION
500.0000 mL | INTRAVENOUS | Status: DC
  Administered 2014-09-30: 500 mL via INTRAVENOUS

## 2014-09-30 MED ORDER — OXYCODONE-ACETAMINOPHEN 5-325 MG PO TABS
1.0000 | ORAL_TABLET | ORAL | Status: DC | PRN
  Administered 2014-10-01: 1 via ORAL
  Filled 2014-09-30: qty 1

## 2014-09-30 MED ORDER — CITRIC ACID-SODIUM CITRATE 334-500 MG/5ML PO SOLN: 30.0000 mL | ORAL | Status: DC | PRN

## 2014-09-30 MED ORDER — FENTANYL CITRATE 0.05 MG/ML IJ SOLN: 100.0000 ug | INTRAMUSCULAR | Status: DC | PRN

## 2014-09-30 NOTE — Progress Notes (Signed)
Patient ID: Debra Allen, female   DOB: October 17, 1986, 28 y.o.   MRN: [id number] Debra Allen is a 28 y.o. G4P3003 at [redacted]w[redacted]d admitted for SOL Subjective: Contractions more painful. More comfortable up walking.  Objective: BP 105/70 mmHg  Pulse 89  Temp(Src) 98.4 F (36.9 C) (Oral)  Resp 20  Ht 5\' 1"  (1.549 m)  Wt 87.998 kg (194 lb)  BMI 36.67 kg/m2  LMP 12/31/2013  Fetal Heart FHR: 130 bpm, variability: moderate,  accelerations:  Present,  decelerations:  Absent   Contractions: q8min  SVE:   Dilation: 5.5 Effacement (%): 80 Station: -3 Exam by:: d Juvon Teater, cnm Cx posterior still  Assessment / Plan:  Labor: Early active. Inadequate UCs> offered pitocin augmentation or walk and recheck. Will ambulate. Fetal Wellbeing: Category 1 Pain Control:  Declines meds Expected mode of delivery: NSVD  Debra Allen 09/30/2014, 3:36 PM

## 2014-09-30 NOTE — MAU Note (Signed)
Contractions woke her at 0200, every 4-5 min.   Watery d/c noted at 2200

## 2014-09-30 NOTE — H&P (Signed)
Debra Allen is a 28 y.o. female presenting for Active labor. Maternal Medical History:  Reason for admission: Contractions.  Nausea.  Contractions: Onset was 3-5 hours ago.   Frequency: regular.   Perceived severity is moderate.    Fetal activity: Last perceived fetal movement was within the past hour.    Prenatal complications: No bleeding or preterm labor.   Prenatal Complications - Diabetes: none.   Cervix checked on arrival to MAU, noted to be 5/80/-2 with a bulging bag.   OB History    Gravida Para Term Preterm AB TAB SAB Ectopic Multiple Living   4 3 3  0 0 0 0 0 0 3     Past Medical History  Diagnosis Date  . Headache(784.0)   . Urinary tract infection    Past Surgical History  Procedure Laterality Date  . No past surgeries     Family History: family history includes Asthma in her other; Cancer in her paternal aunt, paternal grandfather, and paternal grandmother; Diabetes in her maternal grandmother and mother; Heart disease in her other; Hypertension in her father and maternal grandmother; Obesity in her other; Sleep apnea in her other. There is no history of Other. Social History:  reports that she has never smoked. She has never used smokeless tobacco. She reports that she does not drink alcohol or use illicit drugs.   Prenatal Transfer Tool  Maternal Diabetes: No Genetic Screening: Declined Maternal Ultrasounds/Referrals: Normal Fetal Ultrasounds or other Referrals:  None Maternal Substance Abuse:  No Significant Maternal Medications:  None Significant Maternal Lab Results:  Lab values include: Group B Strep negative Other Comments:  None  Review of Systems  Constitutional: Negative for fever and chills.  Respiratory: Negative for cough.   Cardiovascular: Negative for chest pain and palpitations.  Gastrointestinal: Negative for nausea and vomiting.    Dilation: 5 Effacement (%): 80 Station: -2 Exam by:: S. Carrera, RNC Blood pressure  111/72, pulse 97, temperature 98.7 F (37.1 C), temperature source Oral, resp. rate 18, height 5\' 1"  (1.549 m), weight 87.998 kg (194 lb), last menstrual period 12/31/2013. Exam Physical Exam  Constitutional: She is oriented to person, place, and time. She appears well-developed and well-nourished. No distress.  HENT:  Head: Normocephalic and atraumatic.  Cardiovascular: Normal rate.   Respiratory: Effort normal. No respiratory distress.  Musculoskeletal: She exhibits no edema or tenderness.  Neurological: She is alert and oriented to person, place, and time.  Skin: Skin is warm and dry.    Prenatal labs: ABO, Rh: O/POS/-- (07/29 1314) Antibody: NEG (07/29 1314) Rubella: 10.20 (07/29 1314) RPR: NON REAC (09/02 1025)  HBsAg: NEGATIVE (07/29 1314)  HIV: NONREACTIVE (09/02 1025)  GBS:     Assessment/Plan: Debra Allen is a 28 y.o. G4P3003 at [redacted]w[redacted]d here for active labor.  #Labor:Admit for active labor #Pain: Patient desires natural, IV fentanyl available #FWB: Cat 1, reactive, accelerations reassuring. #ID:  GBS neg #MOF: Breast #MOC:Mirena Vs. BTL #Circ:  Na female  History of macrosomal infant, no shoulder dystocia.  History of post partum hemorrhage, consider cytotec PPX  Gevena Barre 09/30/2014, 11:34 AM   Evaluation and management procedures were performed by PA-S under my supervision/collaboration. Chart reviewed, patient examined by me and I agree with management and plan.  Addendum Well-dated by first tri [place] c/w LMP; started [facility] at 23 wks. Marginal cord insertion Hx LGA (4345gm), not macrosomia; EFW 9# 1 hr GCT 105 Alfredia Desanctis Freida Busman, CNM 09/30/2014 2:40 PM

## 2014-09-30 NOTE — Progress Notes (Signed)
Tracing reviewed with Deedra Ehrich, RNC. OK for patient to be transferred to room 175.

## 2014-09-30 NOTE — Progress Notes (Signed)
Delivery Note At  6:23 PM  a viable female was delivered via  (Presentation: OA ;  ).  APGAR: 9, 9; weight  . pending  Placenta status: intact.  Cord:  3 vessel with the following complications: .  Cord pH: pending  Anesthesia:  none Episiotomy:   none Lacerations:   none Suture Repair: n/a Est. Blood Loss (mL):   300  Mom to postpartum.  Baby to Couplet care / Skin to Skin.  [name] 09/30/2014, 6:21 PM   Evaluation and management procedures were performed by Resident physician under my supervision/collaboration. Chart reviewed, patient examined by me and I agree with management and plan. Lorene Dy, CNM 09/30/2014 6:49 PM

## 2014-09-30 NOTE — MAU Provider Note (Signed)
Patient presenting in active labor.  Admit to L&D. See H&P for more details.  [name], MD, MPH PGY-1,  [facility] 09/30/2014 12:06 PM  Evaluation and management procedures were performed by Resident physician under my supervision/collaboration. Chart reviewed, patient examined by me and I agree with management and plan.

## 2014-09-30 NOTE — Progress Notes (Signed)
At approximately 18:00, pt called out with need to push. When I entered the room, pt. was moving from birthing ball back into bed with diarrhea and strong urge to push. SVE revealed pt was complete and baby was on perineum. CNM and resident called immediately for delivery. CNM and resident at nursing station in labor and delivery and arrived in room within a minute. Pt pushed with next contraction and spontaneously delivered a viable baby girl with apgars of 9/9.

## 2014-09-30 NOTE — Progress Notes (Signed)
Patient ID: Debra Allen, female   DOB: 1986-05-10, 28 y.o.   MRN: [id number] Debra Allen is a 28 y.o. G4P3003 at [redacted]w[redacted]Debra admitted for SOL Subjective: Contractions continue to be painful. More comfortable up walking.  Objective: BP 109/69 mmHg  Pulse 87  Temp(Src) 97.9 F (36.6 C) (Oral)  Resp 20  Ht 5\' 1"  (1.549 m)  Wt 87.998 kg (194 lb)  BMI 36.67 kg/m2  LMP 12/31/2013  Fetal Heart FHR: 140 bpm, variability: moderate,  accelerations:  Present,  decelerations:  Absent   Contractions: q55min  SVE:   Dilation: 7 Effacement (%): 80 Station: -2 Exam by:: Debra Allen, cnm Attempt to AROM without gush of fluid, large volume clear LOF about 20 minutes later  Assessment / Plan:  Labor: Early active. Inadequate UCs - likely to improve after AROM. Will offer pitocin if not improved Fetal Wellbeing: Category 1 Pain Control:  Declines meds Expected mode of delivery: NSVD  Debra Allen 09/30/2014, 5:58 PM   Evaluation and management procedures were performed by Resident physician under my supervision/collaboration. Chart reviewed, patient examined by me and I agree with management and plan.

## 2014-09-30 NOTE — Plan of Care (Signed)
Problem: Discharge Progression Outcomes Goal: Elimination assessed prior to transfer Outcome: Completed/Met Date Met:  09/30/14 Goal: Prior to transfer bladder non-distended uterus non-displaced Outcome: Completed/Met Date Met:  09/30/14 Goal: Voids prior to transfer Outcome: Completed/Met Date Met:  09/30/14 Goal: Catheter in place prior to transfer Outcome: Not Applicable Date Met:  37/48/27 Goal: Pericare completed prior to transfer Outcome: Completed/Met Date Met:  09/30/14 Goal: Transfer to Post Partum room/infant to nursery Outcome: Completed/Met Date Met:  09/30/14 Goal: Prior to transfer complete patient charges Outcome: Completed/Met Date Met:  09/30/14 Goal: Prior to transfer discontinue epidural Outcome: Not Applicable Date Met:  07/86/75 Goal: Patient discharged (AMA) against medical advice Outcome: Not Applicable Date Met:  44/92/01 Goal: Patient transferred to postpartum unit Outcome: Completed/Met Date Met:  09/30/14 Goal: Patient to be placed on appropriate new pathway Outcome: Completed/Met Date Met:  09/30/14 Goal: Discharge goals assessed Outcome: Completed/Met Date Met:  09/30/14 Goal: Pain controlled with appropriate interventions Outcome: Completed/Met Date Met:  09/30/14 Goal: Tolerating diet Outcome: Completed/Met Date Met:  09/30/14 Goal: Other Discharge Outcomes/Goals Outcome: Not Applicable Date Met:  00/71/21

## 2014-10-01 LAB — CBC
HCT: 28.4 % — ABNORMAL LOW (ref 36.0–46.0)
Hemoglobin: 9.2 g/dL — ABNORMAL LOW (ref 12.0–15.0)
MCH: 25.7 pg — AB (ref 26.0–34.0)
MCHC: 32.4 g/dL (ref 30.0–36.0)
MCV: 79.3 fL (ref 78.0–100.0)
Platelets: 178 10*3/uL (ref 150–400)
RBC: 3.58 MIL/uL — ABNORMAL LOW (ref 3.87–5.11)
RDW: 14.8 % (ref 11.5–15.5)
WBC: 8.6 10*3/uL (ref 4.0–10.5)

## 2014-10-01 NOTE — Plan of Care (Signed)
Problem: Phase II Progression Outcomes Goal: Progress activity as tolerated unless otherwise ordered Outcome: Completed/Met Date Met:  10/01/14 Goal: Afebrile, VS remain stable Outcome: Completed/Met Date Met:  10/01/14 Goal: Incision intact & without signs/symptoms of infection Outcome: Not Applicable Date Met:  47/84/12 Goal: Rh isoimmunization per orders Outcome: Not Applicable Date Met:  82/08/13 Goal: Other Phase II Outcomes/Goals Outcome: Not Applicable Date Met:  88/71/95

## 2014-10-01 NOTE — Plan of Care (Signed)
Problem: Consults Goal: Postpartum Patient Education (See Patient Education module for education specifics.) Outcome: Completed/Met Date Met:  10/01/14  Problem: Phase I Progression Outcomes Goal: Pain controlled with appropriate interventions Outcome: Completed/Met Date Met:  10/01/14 Goal: Voiding adequately Outcome: Completed/Met Date Met:  10/01/14 Goal: OOB as tolerated unless otherwise ordered Outcome: Completed/Met Date Met:  10/01/14 Goal: VS, stable, temp < 100.4 degrees F Outcome: Completed/Met Date Met:  10/01/14 Goal: Initial discharge plan identified Outcome: Completed/Met Date Met:  10/01/14  Problem: Phase II Progression Outcomes Goal: Pain controlled on oral analgesia Outcome: Completed/Met Date Met:  10/01/14 Goal: Tolerating diet Outcome: Completed/Met Date Met:  10/01/14

## 2014-10-01 NOTE — Progress Notes (Signed)
Post Partum Day 1 Subjective: up ad lib, voiding, tolerating PO and complains of HA this AM  Objective: Blood pressure 99/55, pulse 72, temperature 97.9 F (36.6 C), temperature source Oral, resp. rate 20, height 5\' 1"  (1.549 m), weight 87.998 kg (194 lb), last menstrual period 12/31/2013, SpO2 99 %, unknown if currently breastfeeding.  Physical Exam:  General: alert, cooperative and no distress Lochia: appropriate Uterine Fundus: firm DVT Evaluation: No evidence of DVT seen on physical exam.   Recent Labs  09/30/14 1149 10/01/14 0615  HGB 10.1* 9.2*  HCT 31.0* 28.4*    Assessment/Plan: Plan for discharge tomorrow, Breastfeeding and Contraception mirena   LOS: 1 day   [name] 10/01/2014, 7:47 AM

## 2014-10-01 NOTE — Plan of Care (Signed)
Problem: Phase I Progression Outcomes Goal: Other Phase I Outcomes/Goals Outcome: Not Applicable Date Met:  10/01/14     

## 2014-10-01 NOTE — Progress Notes (Signed)
UR chart review completed.  

## 2014-10-01 NOTE — Lactation Note (Signed)
This note was copied from the chart of Debra [name]. Lactation Consultation Note Speaks good English, denies need for interpreter. States she doesn't have any colostrum to give baby. Mom didn't BF her other children, "May try" with this baby. Noted mom to have slightly wide space between small tubular breast. Denies PCOS. No facial hair or difficulty getting pregnant. Hand pump d/t hard to define boarders of nipple. Looks almost layered? Has thickness in nipple/areola area. Encouraged to roll in finger tips prior to latching. Demonstrated hand expression. No colostrum noted. Mom encouraged to feed baby 8-12 times/24 hours and with feeding cues.  Educated about newborn behavior. Mom encouraged to do skin-to-skin.Mom reports + breast changes w/pregnancy. Mom encouraged to waken baby for feeds. Reviewed Baby & Me book's Breastfeeding Basics. Discussed supply and demand, using formula can decrease milk supply, size of baby's tummy's and amount they need to eat. Mom seems eager to learn.  Patient Name: Debra Allen SELTR'V Date: 10/01/2014 Reason for consult: Initial assessment   Maternal Data Has patient been taught Hand Expression?: Yes Does the patient have breastfeeding experience prior to this delivery?: No  Feeding Feeding Type: Breast Fed Length of feed: 7 min  LATCH Score/Interventions Latch: Repeated attempts needed to sustain latch, nipple held in mouth throughout feeding, stimulation needed to elicit sucking reflex. Intervention(s): Adjust position;Assist with latch;Breast massage;Breast compression  Audible Swallowing: None Intervention(s): Hand expression;Skin to skin Intervention(s): Alternate breast massage  Type of Nipple: Everted at rest and after stimulation  Comfort (Breast/Nipple): Soft / non-tender     Hold (Positioning): Assistance needed to correctly position infant at breast and maintain latch. Intervention(s): Breastfeeding basics  reviewed;Support Pillows;Position options;Skin to skin  LATCH Score: 6  Lactation Tools Discussed/Used Pump Review: Setup, frequency, and cleaning;Milk Storage Initiated by:: Allayne Stack RN Date initiated:: 10/01/14   Consult Status Consult Status: Follow-up Date: 10/01/14 Follow-up type: In-patient    Theodoro Kalata 10/01/2014, 5:16 AM

## 2014-10-01 NOTE — Plan of Care (Signed)
Problem: Discharge Progression Outcomes Goal: Activity appropriate for discharge plan Outcome: Completed/Met Date Met:  10/01/14 Goal: Tolerating diet Outcome: Completed/Met Date Met:  10/01/14 Goal: Pain controlled with appropriate interventions Outcome: Completed/Met Date Met:  10/01/14

## 2014-10-02 MED ORDER — IBUPROFEN 600 MG PO TABS: 600.0000 mg | ORAL_TABLET | ORAL | Status: DC | PRN

## 2014-10-02 NOTE — Discharge Summary (Signed)
Obstetric Discharge Summary Reason for Admission: onset of labor Prenatal Procedures: none Intrapartum Procedures: spontaneous vaginal delivery Postpartum Procedures: none Complications-Operative and Postpartum: none HEMOGLOBIN  Date Value Ref Range Status  10/01/2014 9.2* 12.0 - 15.0 g/dL Final   HCT  Date Value Ref Range Status  10/01/2014 28.4* 36.0 - 46.0 % Final    Hospital Course: Debra Allen is a 28 y.o. [id number] who presented with SOL.  She progressed well through labor without intervention.  On 11/18 around 1820, she gave birth to a viable female infant via NSVD.  Postpartum period without complications. On PPD#2, patient is feeling well without complaints.   Physical Exam:  General: alert, cooperative and no distress Lochia: appropriate Uterine Fundus: firm DVT Evaluation: No evidence of DVT seen on physical exam.  Discharge Diagnoses: Term Pregnancy-delivered  Discharge Information: Date: 10/02/2014 Activity: pelvic rest Diet: routine Medications: Ibuprofen Condition: stable Instructions: refer to practice specific booklet Discharge to: home Follow-up Information    Follow up with [facility]. Schedule an appointment as soon as possible for a visit in 6 weeks.   Why:  For post-partum visit   Contact information:   Debra Allen [id number]       Follow up with [facility].   Why:  As needed for emergencies   Contact information:   [address] [id number] [place] [phone]      Newborn Data: Live born female  Birth Weight: 7 lb 12.9 oz (3541 g) APGAR: 9, 9  Home with mother.  Debra Allen 10/02/2014, 7:34 AM   I have seen and examined this patient and I agree with the above. Serita Grammes CNM 11:18 PM 10/02/2014

## 2014-10-02 NOTE — Plan of Care (Signed)
Problem: Discharge Progression Outcomes Goal: Barriers To Progression Addressed/Resolved Outcome: Not Applicable Date Met:  10/02/14 Goal: Complications resolved/controlled Outcome: Not Applicable Date Met:  10/02/14 Goal: Afebrile, VS remain stable at discharge Outcome: Completed/Met Date Met:  10/02/14 Goal: Discharge plan in place and appropriate Outcome: Completed/Met Date Met:  10/02/14 Goal: Other Discharge Outcomes/Goals Outcome: Completed/Met Date Met:  10/02/14     

## 2014-10-02 NOTE — Discharge Instructions (Signed)

## 2014-10-02 NOTE — Lactation Note (Signed)
This note was copied from the chart of Debra Allen. Lactation Consultation Note  Patient Name: Debra Allen KKDPT'E Date: 10/02/2014 Reason for consult: Follow-up assessment Mom did not breast feed her older children and has been giving lots of bottles with this baby. Mom reports she is BF with some feedings but not consistently. LC explained to Mom the importance of BF with each feeding to encourage milk production, prevent engorgement and protect milk supply. Reviewed supply and demand. Mom reports she thinks baby is latching well, denies tenderness from breastfeeding. She reports "her milk is not in yet". LC encouraged Mom to BF with each feeding at least 8-12 times in 24 hours. Keep baby active at breast for 15-20 minutes. If Mom chooses not to BF with each feeding then she needs to pump. Hand pump given with instructions on use/cleaning. Guidelines for supplementing with BF reviewed with Mom. If not at breast guidelines discussed. Advised to refer to Baby N Me Booklet for storage guidelines with pumping, engorgement care. Baby asleep at this visit, had bottle just prior to visit so did not see latch. Advised Mom of OP services if needed and support group.   Maternal Data    Feeding Feeding Type: Formula Nipple Type: Slow - flow  LATCH Score/Interventions                      Lactation Tools Discussed/Used Tools: Pump;Flanges Flange Size: 27 Breast pump type: Manual WIC Program: Yes   Consult Status Consult Status: Complete Date: 10/02/14 Follow-up type: In-patient    Katrine Coho 10/02/2014, 11:35 AM

## 2014-10-07 ENCOUNTER — Encounter: Payer: Self-pay | Admitting: Obstetrics and Gynecology

## 2014-10-07 NOTE — Progress Notes (Signed)
This is in regards to the Day #1 Progress Note written by Dr Brita Romp.  I have seen and examined this patient and I agree with the above. Serita Grammes CNM 1:00 AM 10/07/2014

## 2014-11-18 ENCOUNTER — Ambulatory Visit (INDEPENDENT_AMBULATORY_CARE_PROVIDER_SITE_OTHER): Payer: Self-pay | Admitting: Obstetrics & Gynecology

## 2014-11-18 ENCOUNTER — Encounter: Payer: Self-pay | Admitting: Obstetrics & Gynecology

## 2014-11-18 DIAGNOSIS — Z7689 Persons encountering health services in other specified circumstances: Secondary | ICD-10-CM

## 2014-11-18 DIAGNOSIS — Z7189 Other specified counseling: Secondary | ICD-10-CM

## 2014-11-18 NOTE — Progress Notes (Signed)
Subjective: wants Mirena. Has pain near right breast for few weeks     Debra Allen is a 29 y.o. female who presents for a postpartum visit. She is 6 week postpartum following a spontaneous vaginal delivery. I have fully reviewed the prenatal and intrapartum course. The delivery was at 40 gestational weeks. Outcome: spontaneous vaginal delivery. Anesthesia: none. Postpartum course has been good, stopped nursing. Baby's course has been good. Baby is feeding by bottle -  . Bleeding no bleeding. Bowel function is normal. Bladder function is normal. Patient is not sexually active. Contraception method is IUD. Postpartum depression screening: negative.  The following portions of the patient's history were reviewed and updated as appropriate: allergies, current medications, past family history, past medical history, past social history, past surgical history and problem list.  Review of Systems Pertinent items are noted in HPI.   Objective:    BP 104/66 mmHg  Pulse 87  Temp(Src) 98.2 F (36.8 C)  Wt 178 lb 1.6 oz (80.786 kg)  Breastfeeding? No  General:  alert and cooperative   Breasts:   r no mass or tenderness  Lungs:  chest mild tenderness chest wall under r breast  Heart:     Abdomen: soft, non-tender; bowel sounds normal; no masses,  no organomegaly   Vulva:  not evaluated  Vagina: not evaluated  Cervix:     Corpus: not examined  Adnexa:  not evaluated  Rectal Exam: Not performed.        Assessment:     normal with suspect MS pain right chest postpartum exam. Pap smear not done at today's visit.   Plan:    1. Contraception: none 2. Ibuprofen prn primary care f/u prn 3. Follow up as needed. Application for Mirena  Woodroe Mode, MD 11/18/2014

## 2014-12-23 ENCOUNTER — Ambulatory Visit: Payer: Medicaid Other | Attending: Internal Medicine | Admitting: Internal Medicine

## 2014-12-23 ENCOUNTER — Encounter: Payer: Self-pay | Admitting: Internal Medicine

## 2014-12-23 VITALS — BP 101/69 | HR 79 | Temp 98.0°F | Resp 16 | Ht 63.0 in | Wt 180.0 lb

## 2014-12-23 DIAGNOSIS — K0889 Other specified disorders of teeth and supporting structures: Secondary | ICD-10-CM

## 2014-12-23 DIAGNOSIS — K088 Other specified disorders of teeth and supporting structures: Secondary | ICD-10-CM | POA: Insufficient documentation

## 2014-12-23 DIAGNOSIS — Z3009 Encounter for other general counseling and advice on contraception: Secondary | ICD-10-CM | POA: Insufficient documentation

## 2014-12-23 DIAGNOSIS — L729 Follicular cyst of the skin and subcutaneous tissue, unspecified: Secondary | ICD-10-CM | POA: Insufficient documentation

## 2014-12-23 DIAGNOSIS — N644 Mastodynia: Secondary | ICD-10-CM | POA: Insufficient documentation

## 2014-12-23 NOTE — Progress Notes (Signed)
MRN: [id number] Name: Debra Allen  Sex: female Age: 29 y.o. DOB: 1986-09-11  Allergies: Keflex  Chief Complaint  Patient presents with  . Breast Pain  . Leg Pain    HPI: Patient is 29 y.o. female who is 3 months postpartum, as per patient she followed with OB/GYN at that time she had some breast pain, she has been bottle feeding since she has not had much of the milk production has occasional Minsky discharge denies any fever chills or any purulent discharge or blood, EMR reviewed patient was seen by her OB/GYN and discussed about IUD placement as per patient she has lost the insurance and wants to have contraception.patient also reported to have noticed this on her left leg for the last 3 years recently she had some discharge.  Past Medical History  Diagnosis Date  . Headache(784.0)   . Urinary tract infection     Past Surgical History  Procedure Laterality Date  . No past surgeries        Medication List       This list is accurate as of: 12/23/14  6:00 PM.  Always use your most recent med list.               acetaminophen 325 MG tablet  Commonly known as:  TYLENOL  Take 650 mg by mouth every 6 (six) hours as needed.     ibuprofen 600 MG tablet  Commonly known as:  ADVIL,MOTRIN  Take 1 tablet (600 mg total) by mouth every 4 (four) hours as needed.        Meds ordered this encounter  Medications  . acetaminophen (TYLENOL) 325 MG tablet    Sig: Take 650 mg by mouth every 6 (six) hours as needed.    Immunization History  Administered Date(s) Administered  . Influenza Split 09/07/2011, 12/25/2012  . Influenza,inj,Quad PF,36+ Mos 07/29/2014  . Tdap 06/11/2013, 07/15/2014    Family History  Problem Relation Age of Onset  . Other Neg Hx   . Diabetes Mother   . Hypertension Maternal Grandmother   . Diabetes Maternal Grandmother   . Cancer Paternal Grandmother   . Cancer Paternal Grandfather   . Hypertension Father   . Cancer Paternal Aunt      bladder cancer   . Asthma Other   . Obesity Other   . Sleep apnea Other   . Heart disease Other     History  Substance Use Topics  . Smoking status: Never Smoker   . Smokeless tobacco: Never Used  . Alcohol Use: No    Review of Systems   As noted in HPI  Filed Vitals:   12/23/14 1716  BP: 101/69  Pulse: 79  Temp: 98 F (36.7 C)  Resp: 16    Physical Exam  Physical Exam  Constitutional: No distress.  Eyes: EOM are normal. Pupils are equal, round, and reactive to light.  Cardiovascular: Normal rate and regular rhythm.   Pulmonary/Chest: Breath sounds normal. No respiratory distress. She has no wheezes. She has no rales.  Skin:  Small cyst noticed on the left leg nontender, no apparent discharge.    CBC    Component Value Date/Time   WBC 8.6 10/01/2014 0615   RBC 3.58* 10/01/2014 0615   HGB 9.2* 10/01/2014 0615   HCT 28.4* 10/01/2014 0615   PLT 178 10/01/2014 0615   MCV 79.3 10/01/2014 0615   LYMPHSABS 1.6 06/10/2014 1314   MONOABS 0.3 06/10/2014 1314   EOSABS 0.1 06/10/2014  1314   BASOSABS 0.0 06/10/2014 1314    CMP     Component Value Date/Time   NA 141 10/07/2013 1038   K 4.3 10/07/2013 1038   CL 102 10/07/2013 1038   CO2 25 10/07/2013 1038   GLUCOSE 92 08/28/2014 2125   BUN 11 10/07/2013 1038   CREATININE 0.47* 10/07/2013 1038   CREATININE 0.49* 08/13/2012 1630   CALCIUM 9.6 10/07/2013 1038   PROT 7.5 10/07/2013 1038   ALBUMIN 4.8 10/07/2013 1038   AST 29 10/07/2013 1038   ALT 76* 10/07/2013 1038   ALKPHOS 76 10/07/2013 1038   BILITOT 1.0 10/07/2013 1038   GFRNONAA >89 10/07/2013 1038   GFRNONAA >90 08/13/2012 1630   GFRAA >89 10/07/2013 1038   GFRAA >90 08/13/2012 1630    Lab Results  Component Value Date/Time   CHOL 160 10/07/2013 10:38 AM    No components found for: HGA1C  Lab Results  Component Value Date/Time   AST 29 10/07/2013 10:38 AM    Assessment and Plan  Skin cyst Apparently no signs of infection, advise  patient to observe for now and take ibuprofen when necessary, she will get immediate medical attention if she has any worsening of the symptoms, she understands verbalized instructions.  Breast pain/Encounter for other general counseling or advice on contraception  Patient will be scheduled with Dr. Adrian Blackwater for complete gynecological examination/ IUD placement.  Pain, dental Patient is given information about a dentist, she'll make an appointment.  Health Maintenance  -Vaccinations:  Up-to-date with flu shot  Return for schedule patiennt with Dr. Adrian Blackwater for IUD placement .  Lorayne Marek, MD

## 2014-12-23 NOTE — Progress Notes (Signed)
Patient is 3 months post partum She is complaining of bilateral breast pain She has a sore area on her left anterior calf that has been throbbing for 2 weeks She is also requesting birth control Patient was here in 07/2014 for tooth pain -you put in referral to dentist but patient says she never received a phone call and Her tooth is still bothering her

## 2014-12-31 ENCOUNTER — Encounter: Payer: Self-pay | Admitting: Family Medicine

## 2014-12-31 ENCOUNTER — Ambulatory Visit: Payer: Medicaid Other | Attending: Family Medicine | Admitting: Family Medicine

## 2014-12-31 VITALS — BP 117/82 | HR 71 | Temp 98.6°F | Resp 16 | Ht 63.0 in | Wt 180.0 lb

## 2014-12-31 DIAGNOSIS — D649 Anemia, unspecified: Secondary | ICD-10-CM

## 2014-12-31 DIAGNOSIS — Z30430 Encounter for insertion of intrauterine contraceptive device: Secondary | ICD-10-CM | POA: Insufficient documentation

## 2014-12-31 LAB — CBC
HEMATOCRIT: 33.7 % — AB (ref 36.0–46.0)
Hemoglobin: 11.2 g/dL — ABNORMAL LOW (ref 12.0–15.0)
MCH: 27.7 pg (ref 26.0–34.0)
MCHC: 33.2 g/dL (ref 30.0–36.0)
MCV: 83.2 fL (ref 78.0–100.0)
MPV: 9.8 fL (ref 8.6–12.4)
Platelets: 238 10*3/uL (ref 150–400)
RBC: 4.05 MIL/uL (ref 3.87–5.11)
RDW: 16.8 % — ABNORMAL HIGH (ref 11.5–15.5)
WBC: 7.3 10*3/uL (ref 4.0–10.5)

## 2014-12-31 LAB — IRON AND TIBC
%SAT: 14 % — AB (ref 20–55)
Iron: 39 ug/dL — ABNORMAL LOW (ref 42–145)
TIBC: 269 ug/dL (ref 250–470)
UIBC: 230 ug/dL (ref 125–400)

## 2014-12-31 LAB — POCT URINE PREGNANCY: Preg Test, Ur: NEGATIVE

## 2014-12-31 MED ORDER — IBUPROFEN 600 MG PO TABS: 600.0000 mg | ORAL_TABLET | Freq: Three times a day (TID) | ORAL | Status: DC | PRN

## 2014-12-31 MED ORDER — IBUPROFEN 600 MG PO TABS: 600.0000 mg | ORAL_TABLET | ORAL | Status: DC | PRN

## 2014-12-31 NOTE — Progress Notes (Signed)
Patient ID: Debra Allen, female   DOB: 06-06-1986, 29 y.o.   MRN: [id number] IUD Insertion Procedure Note Spanish interpreter present  Pre-operative Diagnosis: desires long term contraception   Post-operative Diagnosis: same  Indications: contraception  Procedure Details  Urine pregnancy test was done today and result was negative.  The risks (including infection, bleeding, pain, and uterine perforation) and benefits of the procedure were explained to the patient and Written informed consent was obtained.    Cervix cleansed with Betadine. Uterus sounded to 8 cm. IUD inserted without difficulty. String visible and trimmed. Patient tolerated procedure well.  IUD Information: Mirena.Lot # [id number] Exp 06/18  Condition: Stable  Complications: None  Plan:  The patient was advised to call for any fever or for prolonged or severe pain or bleeding. She was advised to use OTC analgesics as needed for mild to moderate pain.

## 2014-12-31 NOTE — Progress Notes (Signed)
iud INSERTION

## 2014-12-31 NOTE — Patient Instructions (Addendum)
Mrs. Debra Allen,  Thank you for coming in today. Take ibuprofen 600 mg up to three times daily as needed for cramping.   Present for care if you have fever, severe abdominal pain, foul discharge.   F/u in one week for IUD string check and discuss breast concerns.   Dr. Adrian Blackwater

## 2015-01-01 LAB — FERRITIN: Ferritin: 22 ng/mL (ref 10–291)

## 2015-01-03 ENCOUNTER — Encounter: Payer: Self-pay | Admitting: Family Medicine

## 2015-01-03 MED ORDER — LEVONORGESTREL 20 MCG/24HR IU IUD: INTRAUTERINE_SYSTEM | Freq: Once | INTRAUTERINE | Status: DC

## 2015-01-03 NOTE — Assessment & Plan Note (Signed)
A: patient desires IUD. Patient consented to IUD after discussing likely side effects emphasizing spotting that may occur daily for one year and the risk and benefits of the procedure P: IUD inserted, mirena

## 2015-01-04 ENCOUNTER — Ambulatory Visit: Payer: Medicaid Other | Admitting: Family Medicine

## 2015-01-05 ENCOUNTER — Telehealth: Payer: Self-pay | Admitting: *Deleted

## 2015-01-05 NOTE — Telephone Encounter (Signed)
-----   Message from Minerva Ends, MD sent at 01/03/2015 10:21 AM EST ----- Slight anemia with iron deficiency. Will defer treatment to pt's PCP.  Eldean Nanna please inform patient Dr. Alba Cory.

## 2015-01-05 NOTE — Telephone Encounter (Signed)
Pt aware of lab rresults

## 2015-01-05 NOTE — Telephone Encounter (Signed)
Left voice message to return call (VM in Spanish)

## 2015-01-08 ENCOUNTER — Encounter: Payer: Self-pay | Admitting: Family Medicine

## 2015-01-08 ENCOUNTER — Ambulatory Visit: Payer: Medicaid Other | Attending: Family Medicine | Admitting: Family Medicine

## 2015-01-08 VITALS — BP 104/61 | HR 61 | Temp 98.2°F | Resp 16 | Ht 63.0 in | Wt 179.0 lb

## 2015-01-08 DIAGNOSIS — N644 Mastodynia: Secondary | ICD-10-CM | POA: Insufficient documentation

## 2015-01-08 DIAGNOSIS — D649 Anemia, unspecified: Secondary | ICD-10-CM | POA: Insufficient documentation

## 2015-01-08 DIAGNOSIS — I8390 Asymptomatic varicose veins of unspecified lower extremity: Secondary | ICD-10-CM | POA: Insufficient documentation

## 2015-01-08 DIAGNOSIS — Z30431 Encounter for routine checking of intrauterine contraceptive device: Secondary | ICD-10-CM | POA: Insufficient documentation

## 2015-01-08 DIAGNOSIS — I83812 Varicose veins of left lower extremities with pain: Secondary | ICD-10-CM | POA: Insufficient documentation

## 2015-01-08 DIAGNOSIS — I8393 Asymptomatic varicose veins of bilateral lower extremities: Secondary | ICD-10-CM

## 2015-01-08 DIAGNOSIS — Z30430 Encounter for insertion of intrauterine contraceptive device: Secondary | ICD-10-CM

## 2015-01-08 DIAGNOSIS — O9229 Other disorders of breast associated with pregnancy and the puerperium: Secondary | ICD-10-CM | POA: Insufficient documentation

## 2015-01-08 HISTORY — DX: Asymptomatic varicose veins of unspecified lower extremity: I83.90

## 2015-01-08 MED ORDER — TAB-A-VITE/IRON PO TABS: 1.0000 | ORAL_TABLET | Freq: Every day | ORAL | Status: DC

## 2015-01-08 NOTE — Progress Notes (Signed)
PI # [id number] used for interpretation Patient here to check IUD after placement last week

## 2015-01-08 NOTE — Assessment & Plan Note (Signed)
R breast pain: Normal exam No mass or skin rash. I suspect persistent mammary gland swelling Recommend: tight fitting soft bra, tylenol or ibuprofen, monitoring.  No ultrasound needed since you are still so recently post partum.

## 2015-01-08 NOTE — Patient Instructions (Signed)
Mrs. Debra Allen,   1. R breast pain: Normal exam No mass or skin rash. I suspect persistent mammary gland swelling Recommend: tight fitting soft bra, tylenol or ibuprofen, monitoring.  No ultrasound needed since you are still so recently post partum.   2. IUD strings looking good. Cleared to start intercourse.  3. Multivitamin with iron.  4.L leg pain with varicose vein: Due to weight gain Increase exercise to 30 minutes 3-4 times per week  F/u with Dr. Annitta Needs

## 2015-01-08 NOTE — Assessment & Plan Note (Signed)
IUD strings looking good. Cleared to start intercourse.

## 2015-01-08 NOTE — Progress Notes (Signed)
   Subjective:    Patient ID: [name], female    DOB: Jan 04, 1986, 29 y.o.   MRN: [id number] CC: IUD string check, R breast pain  HPI 29 yo F:  1. R breast pain: since 4 weeks post partum. Now 12 weeks post partum. Still with breast pain. No nipple discharge. No mass. No redness.   2. F/u IUD: no bleeding or cramping. IUD placed last week by me. Has not had sex yet.   Soc Hx: non smoker  Review of Systems As per HPI     Objective:   Physical Exam BP 104/61 mmHg  Pulse 61  Temp(Src) 98.2 F (36.8 C)  Resp 16  Ht 5\' 3"  (1.6 m)  Wt 179 lb (81.194 kg)  BMI 31.72 kg/m2  SpO2 99%  LMP 12/08/2014 General appearance: alert, cooperative and no distress Breasts: Inspection negative, No nipple retraction or dimpling, No nipple discharge or bleeding, No axillary or supraclavicular adenopathy, no breast mass. R breast TTP at 6-9 o'clock 2-3 cm from areola  Pelvic: external genitalia normal, IUD strings visualized  Ext: varicose vein L medial calf w/o palpable cord or calf tenderness.        Assessment & Plan:

## 2015-01-08 NOTE — Assessment & Plan Note (Signed)
4.L leg pain with varicose vein: Due to weight gain Increase exercise to 30 minutes 3-4 times per week

## 2015-01-08 NOTE — Assessment & Plan Note (Addendum)
Multivitamin with iron.

## 2015-01-25 ENCOUNTER — Encounter: Payer: Self-pay | Admitting: Internal Medicine

## 2015-01-25 ENCOUNTER — Ambulatory Visit: Payer: Medicaid Other | Attending: Internal Medicine | Admitting: Internal Medicine

## 2015-01-25 ENCOUNTER — Ambulatory Visit (HOSPITAL_COMMUNITY)
Admission: RE | Admit: 2015-01-25 | Discharge: 2015-01-25 | Disposition: A | Discharge status: Home / Self Care | Payer: Medicaid Other | Source: Ambulatory Visit | Attending: Internal Medicine | Admitting: Internal Medicine

## 2015-01-25 ENCOUNTER — Other Ambulatory Visit: Payer: Self-pay

## 2015-01-25 VITALS — BP 104/71 | HR 70 | Temp 98.0°F | Resp 16 | Wt 178.4 lb

## 2015-01-25 DIAGNOSIS — R200 Anesthesia of skin: Secondary | ICD-10-CM

## 2015-01-25 DIAGNOSIS — E559 Vitamin D deficiency, unspecified: Secondary | ICD-10-CM

## 2015-01-25 DIAGNOSIS — D649 Anemia, unspecified: Secondary | ICD-10-CM

## 2015-01-25 DIAGNOSIS — R0602 Shortness of breath: Secondary | ICD-10-CM | POA: Insufficient documentation

## 2015-01-25 DIAGNOSIS — F411 Generalized anxiety disorder: Secondary | ICD-10-CM

## 2015-01-25 DIAGNOSIS — H538 Other visual disturbances: Secondary | ICD-10-CM

## 2015-01-25 DIAGNOSIS — R202 Paresthesia of skin: Secondary | ICD-10-CM

## 2015-01-25 LAB — VITAMIN B12: VITAMIN B 12: 548 pg/mL (ref 211–911)

## 2015-01-25 MED ORDER — VITAMIN D (ERGOCALCIFEROL) 1.25 MG (50000 UNIT) PO CAPS: 50000.0000 [IU] | ORAL_CAPSULE | ORAL | Status: DC

## 2015-01-25 MED ORDER — TAB-A-VITE/IRON PO TABS
1.0000 | ORAL_TABLET | Freq: Every day | ORAL | Status: DC
Start: 2015-01-25 — End: 2020-08-19

## 2015-01-25 MED ORDER — FERROUS SULFATE 325 (65 FE) MG PO TABS: 325.0000 mg | ORAL_TABLET | Freq: Three times a day (TID) | ORAL | Status: DC

## 2015-01-25 NOTE — Progress Notes (Signed)
Patient complains of SOB Having a rapid heart rate Tingling in her left arm Pressure across the front of her head

## 2015-01-25 NOTE — Progress Notes (Signed)
MRN: [id number] Name: Debra Allen  Sex: female Age: 29 y.o. DOB: 1986-02-27  Allergies: Keflex  Chief Complaint  Patient presents with  . Shortness of Breath    HPI: Patient is 29 y.o. female who has history of iron deficiency anemia comes today complaining of shortness of breath especially at night and sometimes she feels palpitation, was also concerned some occasionally she has numbness tingling in her left hand fingers, in the past she had similar symptoms had a CT angiogram which was negative for PE as well as her troponins were negative, today EKG in office shows normal sinus rhythm no acute ST-T changes, currently she denies any chest pain, she is 3 months postpartum and reports occasional anxiety symptoms, denies any SI or HI as per patient she has already been referred to a counselor which she will make an appointment, denies any exertional chest pain or shortness of breath, denies any fever chills cough, she's requesting refill on multivitamin. In the past she was also low vitamin D. Patient denies smoking cigarettes, does not have history of hypertension or diabetes.patient wears the corrective glasses and reports occasional blurry visions and is requesting referral to see an eye doctor.  Past Medical History  Diagnosis Date  . Headache(784.0)   . Urinary tract infection     Past Surgical History  Procedure Laterality Date  . No past surgeries        Medication List       This list is accurate as of: 01/25/15 11:40 AM.  Always use your most recent med list.               acetaminophen 325 MG tablet  Commonly known as:  TYLENOL  Take 650 mg by mouth every 6 (six) hours as needed.     ferrous sulfate 325 (65 FE) MG tablet  Commonly known as:  FERROUSUL  Take 1 tablet (325 mg total) by mouth 3 (three) times daily with meals.     ibuprofen 600 MG tablet  Commonly known as:  ADVIL,MOTRIN  Take 1 tablet (600 mg total) by mouth every 8 (eight) hours as  needed.     levonorgestrel 20 MCG/24HR IUD  Commonly known as:  MIRENA  - 1 each by Intrauterine route once. Lot # [id number], Exp 06/18.  - Due for removal in 5 years 01/01/2020     multivitamins with iron Tabs tablet  Take 1 tablet by mouth daily.     Vitamin D (Ergocalciferol) 50000 UNITS Caps capsule  Commonly known as:  DRISDOL  Take 1 capsule (50,000 Units total) by mouth every 7 (seven) days.        Meds ordered this encounter  Medications  . ferrous sulfate (FERROUSUL) 325 (65 FE) MG tablet    Sig: Take 1 tablet (325 mg total) by mouth 3 (three) times daily with meals.    Dispense:  90 tablet    Refill:  3  . Multiple Vitamins-Iron (MULTIVITAMINS WITH IRON) TABS tablet    Sig: Take 1 tablet by mouth daily.    Dispense:  90 tablet    Refill:  1  . Vitamin D, Ergocalciferol, (DRISDOL) 50000 UNITS CAPS capsule    Sig: Take 1 capsule (50,000 Units total) by mouth every 7 (seven) days.    Dispense:  12 capsule    Refill:  0    Immunization History  Administered Date(s) Administered  . Influenza Split 09/07/2011, 12/25/2012  . Influenza,inj,Quad PF,36+ Mos 07/29/2014  . Tdap 06/11/2013,  07/15/2014    Family History  Problem Relation Age of Onset  . Other Neg Hx   . Diabetes Mother   . Hypertension Maternal Grandmother   . Diabetes Maternal Grandmother   . Cancer Paternal Grandmother   . Cancer Paternal Grandfather   . Hypertension Father   . Cancer Paternal Aunt     bladder cancer   . Asthma Other   . Obesity Other   . Sleep apnea Other   . Heart disease Other     History  Substance Use Topics  . Smoking status: Never Smoker   . Smokeless tobacco: Never Used  . Alcohol Use: No    Review of Systems   As noted in HPI  Filed Vitals:   01/25/15 1053  BP: 104/71  Pulse: 70  Temp: 98 F (36.7 C)  Resp: 16    Physical Exam  Physical Exam  Constitutional: She is oriented to person, place, and time. No distress.  Eyes: EOM are normal. Pupils  are equal, round, and reactive to light.  Neck: Neck supple.  Cardiovascular: Normal rate and regular rhythm.   Pulmonary/Chest: Breath sounds normal. No respiratory distress. She has no wheezes. She has no rales.  Musculoskeletal: She exhibits no edema.  Neurological: She is alert and oriented to person, place, and time. She has normal reflexes. No cranial nerve deficit.    CBC    Component Value Date/Time   WBC 7.3 12/31/2014 1807   RBC 4.05 12/31/2014 1807   HGB 11.2* 12/31/2014 1807   HCT 33.7* 12/31/2014 1807   PLT 238 12/31/2014 1807   MCV 83.2 12/31/2014 1807   LYMPHSABS 1.6 06/10/2014 1314   MONOABS 0.3 06/10/2014 1314   EOSABS 0.1 06/10/2014 1314   BASOSABS 0.0 06/10/2014 1314    CMP     Component Value Date/Time   NA 141 10/07/2013 1038   K 4.3 10/07/2013 1038   CL 102 10/07/2013 1038   CO2 25 10/07/2013 1038   GLUCOSE 92 08/28/2014 2125   BUN 11 10/07/2013 1038   CREATININE 0.47* 10/07/2013 1038   CREATININE 0.49* 08/13/2012 1630   CALCIUM 9.6 10/07/2013 1038   PROT 7.5 10/07/2013 1038   ALBUMIN 4.8 10/07/2013 1038   AST 29 10/07/2013 1038   ALT 76* 10/07/2013 1038   ALKPHOS 76 10/07/2013 1038   BILITOT 1.0 10/07/2013 1038   GFRNONAA >89 10/07/2013 1038   GFRNONAA >90 08/13/2012 1630   GFRAA >89 10/07/2013 1038   GFRAA >90 08/13/2012 1630    Lab Results  Component Value Date/Time   CHOL 160 10/07/2013 10:38 AM    No components found for: HGA1C  Lab Results  Component Value Date/Time   AST 29 10/07/2013 10:38 AM    Assessment and Plan  Anemia, unspecified anemia type - Plan: ferrous sulfate (FERROUSUL) 325 (65 FE) MG tablet, Multiple Vitamins-Iron (MULTIVITAMINS WITH IRON) TABS tablet  Blurry vision - Plan: Ambulatory referral to Ophthalmology  Numbness and tingling in hands- Plan: will check Vitamin B12  SOB (shortness of breath)/Anxiety state Patient's vitals are stable, examination is is benign, slightly secondary to anxiety symptoms,  she is going to make an appointment with counselor  Vitamin D deficiency - Plan: Vitamin D, Ergocalciferol, (DRISDOL) 50000 UNITS CAPS capsule   Health Maintenance  -Vaccinations:  Up-to-date with flu shot   Return in about 3 months (around 04/27/2015), or if symptoms worsen or fail to improve, for anemia.   The patient was given clear instructions to go to ER  or return to medical center if symptoms don't improve, worsen or new problems develop. The patient verbalized understanding.   This note has been created with Surveyor, quantity. Any transcriptional errors are unintentional.    Lorayne Marek, MD

## 2015-01-28 ENCOUNTER — Telehealth: Payer: Self-pay | Admitting: *Deleted

## 2015-01-28 NOTE — Telephone Encounter (Signed)
Pt aware of normal lab results (information given in Spanish)

## 2015-03-18 ENCOUNTER — Ambulatory Visit: Payer: Medicaid Other | Attending: Family Medicine | Admitting: Family Medicine

## 2015-03-18 ENCOUNTER — Other Ambulatory Visit (HOSPITAL_COMMUNITY): Admission: RE | Admit: 2015-03-18 | Payer: MEDICAID | Source: Ambulatory Visit | Admitting: Family Medicine

## 2015-03-18 ENCOUNTER — Encounter: Payer: Self-pay | Admitting: Family Medicine

## 2015-03-18 VITALS — BP 99/65 | HR 98 | Temp 97.5°F | Resp 16 | Ht 63.0 in | Wt 178.0 lb

## 2015-03-18 DIAGNOSIS — Z30430 Encounter for insertion of intrauterine contraceptive device: Secondary | ICD-10-CM | POA: Diagnosis not present

## 2015-03-18 DIAGNOSIS — Z30431 Encounter for routine checking of intrauterine contraceptive device: Secondary | ICD-10-CM | POA: Diagnosis not present

## 2015-03-18 DIAGNOSIS — R1011 Right upper quadrant pain: Secondary | ICD-10-CM | POA: Insufficient documentation

## 2015-03-18 DIAGNOSIS — R109 Unspecified abdominal pain: Secondary | ICD-10-CM | POA: Insufficient documentation

## 2015-03-18 DIAGNOSIS — M545 Low back pain: Secondary | ICD-10-CM | POA: Diagnosis not present

## 2015-03-18 DIAGNOSIS — N898 Other specified noninflammatory disorders of vagina: Secondary | ICD-10-CM | POA: Insufficient documentation

## 2015-03-18 DIAGNOSIS — M549 Dorsalgia, unspecified: Secondary | ICD-10-CM | POA: Insufficient documentation

## 2015-03-18 LAB — POCT URINALYSIS DIPSTICK
BILIRUBIN UA: NEGATIVE
GLUCOSE UA: NEGATIVE
KETONES UA: NEGATIVE
LEUKOCYTES UA: NEGATIVE
NITRITE UA: NEGATIVE
PH UA: 6.5
SPEC GRAV UA: 1.02
Urobilinogen, UA: 1

## 2015-03-18 LAB — POCT URINE PREGNANCY: PREG TEST UR: NEGATIVE

## 2015-03-18 MED ORDER — IBUPROFEN 600 MG PO TABS: 600.0000 mg | ORAL_TABLET | Freq: Three times a day (TID) | ORAL | Status: DC | PRN

## 2015-03-18 NOTE — Assessment & Plan Note (Addendum)
A: RUQ pain and suprapubic pain. Moderate blood on UA.  U preg negative  DDx: renal stones, gallstone, MSK pain, pain related to IUD P: Ibuprofen CBC, CMP Abdominal ultrasound  Gc/chlam Wet prep

## 2015-03-18 NOTE — Addendum Note (Signed)
Addended by: Boykin Nearing on: 03/18/2015 05:40 PM   Modules accepted: Orders

## 2015-03-18 NOTE — Assessment & Plan Note (Signed)
A: LL back pain with radiation down L leg prn  P: Ibuprofen prn Stretches

## 2015-03-18 NOTE — Patient Instructions (Addendum)
Mrs. Debra Allen,  1. Abdominal pain: Pelvic exam was normal, IUD string checked U preg negative  Take ibuprofen as needed Strain urine to check for kidney stones Getting abdominal ultrasound   2. Back pain, improving Take ibuprofen Stretches, see below  F/u with Dr. Annitta Needs, PCP, for back and abdominal pain in 4-6 weeks   Dr. Adrian Blackwater  Ejercicios para la espalda (Back Exercises) Estos ejercicios ayudan a tratar y a prevenir lesiones en la espalda. El objetivo es aumentar la fuerza en los msculos del vientre (abdomen) y de la espalda. Estos ejercicios tambin lo ayudarn a mejorar la flexibilidad. Comience a realizar estos ejercicios cuando el mdico se lo indique. CUIDADOS EN EL HOGAR Los ejercicios para la espalda incluyen: Inclinacin de la pelvis.  Recustese sobre la espalda con las rodillas flexionadas. Incline la pelvis hasta que la parte inferior de la espalda se apoye en el piso. Mantenga esta posicin durante 5 a 10 segundos. Repita este ejercicio 5 a 10 veces. Rodilla al pecho.  Empuje con la rodilla contra el pecho y [place] posicin durante 20 a 30 segundos. Repita con la otra pierna. Esto puede realizarlo con la otra pierna extendida o flexionada, del modo en que se sienta ms cmodo. Luego presione ambas rodillas contra el pecho. Abdominales.  Kiel a 90 grados. Comience doblando la pelvis y haga un abdominal parcial y en forma lenta. Slo eleve la parte superior a 30  45 grados del suelo. Emplee al [employer] 2 y 3 segundos para cada abdominal. No haga los abdominales con las rodillas extendidas. Si hacer abdominales parciales le resulta difcil, simplemente haga el ejercicio pero slo endureciendo los msculos del vientre(abdomen) y manteniendo segn la indicacin. Elevar la cadera.  Recustese sobre la espalda con las rodillas flexionadas a 90 grados. Presione con los pies y los hombros a medida que eleva las caderas a 5 cm del suelo. Mantenga  durante 10 segundos y repita 5 a 10 veces. Arquear la espalda.  Acustese [employer]. Levntese apoyando los codos doblados. Presione lentamente con las manos, formando un arco con la zona inferior de la espalda. Repita entre 3 y 5 veces. Elevar los hombros.  Acustese boca abajo con los brazos a los lados del cuerpo. Rouse y Photographer torso contra el suelo mientras eleva lentamente la cabeza y los hombros del suelo. No exagere al [employer] ejercicios. Tenga cuidado al principio. Los ejercicios pueden causar algunas molestias leves en la espalda. Si el dolor dura ms de 15 minutos, detenga los ejercicios hasta que consulte al mdico. Los problemas en la espalda mejoran de [place] lenta con esta terapia.  Document Released: 02/14/2011 Document Revised: 01/22/2012 [facility] Patient Information 2015 [place]. This information is not intended to replace advice given to you by your health care provider. Make sure you discuss any questions you have with your health care provider.

## 2015-03-18 NOTE — Progress Notes (Signed)
   Subjective:    Patient ID: Debra Allen, female    DOB: August 31, 1986, 29 y.o.   MRN: [id number] CC: IUD check, back pain  HPI  1 abdominal pains:  X 3 weeks. lower abdominal and RUQ pains. No fever, chills, emesis, dysuria. Some nausea.   2. Back pain: x 5 days. L low back. Pain radiates down L leg. Also having numbness. No weakness or incontinence. Pain improved with rest over the past 3 days.   Soc Hx: non smoker   Fam Hx: mom with gallstones  Review of Systems  Constitutional: Negative for fever and chills.  Gastrointestinal: Positive for nausea and abdominal pain. Negative for diarrhea, constipation and blood in stool.  Genitourinary: Positive for vaginal bleeding, vaginal pain and menstrual problem. Negative for dysuria and vaginal discharge.  Musculoskeletal: Positive for back pain.       Objective:   Physical Exam BP 99/65 mmHg  Pulse 98  Temp(Src) 97.5 F (36.4 C) (Oral)  Resp 16  Ht 5\' 3"  (1.6 m)  Wt 178 lb (80.74 kg)  BMI 31.54 kg/m2  SpO2 97% General appearance: alert, cooperative and no distress Back: symmetric, no curvature. ROM normal. No CVA tenderness. Abdomen: soft, non-tender; bowel sounds normal; no masses,  no organomegaly Pelvic: cervix normal in appearance, external genitalia normal, no adnexal masses or tenderness, no cervical motion tenderness, positive findings: vaginal discharge:  mucoid, rectovaginal septum normal and uterus normal size, shape, and consistency       Assessment & Plan:

## 2015-03-18 NOTE — Progress Notes (Signed)
Complaining of Pain due to IUD  Severe pain on back running down to lt leg x 5day

## 2015-03-19 LAB — COMPLETE METABOLIC PANEL WITH GFR
ALK PHOS: 94 U/L (ref 39–117)
ALT: 162 U/L — ABNORMAL HIGH (ref 0–35)
AST: 84 U/L — AB (ref 0–37)
Albumin: 4.6 g/dL (ref 3.5–5.2)
BILIRUBIN TOTAL: 0.8 mg/dL (ref 0.2–1.2)
BUN: 12 mg/dL (ref 6–23)
CO2: 28 mEq/L (ref 19–32)
Calcium: 9.3 mg/dL (ref 8.4–10.5)
Chloride: 101 mEq/L (ref 96–112)
Creat: 0.48 mg/dL — ABNORMAL LOW (ref 0.50–1.10)
GFR, Est African American: >89 mL/min
GLUCOSE: 97 mg/dL (ref 70–99)
Potassium: 4.5 mEq/L (ref 3.5–5.3)
Sodium: 137 mEq/L (ref 135–145)
TOTAL PROTEIN: 7.4 g/dL (ref 6.0–8.3)

## 2015-03-19 LAB — CBC
HEMATOCRIT: 37.7 % (ref 36.0–46.0)
Hemoglobin: 12.7 g/dL (ref 12.0–15.0)
MCH: 27.9 pg (ref 26.0–34.0)
MCHC: 33.7 g/dL (ref 30.0–36.0)
MCV: 82.9 fL (ref 78.0–100.0)
MPV: 10.3 fL (ref 8.6–12.4)
Platelets: 223 10*3/uL (ref 150–400)
RBC: 4.55 MIL/uL (ref 3.87–5.11)
RDW: 13.9 % (ref 11.5–15.5)
WBC: 7 10*3/uL (ref 4.0–10.5)

## 2015-03-22 ENCOUNTER — Telehealth: Payer: Self-pay | Admitting: *Deleted

## 2015-03-22 LAB — CERVICOVAGINAL ANCILLARY ONLY
CHLAMYDIA, DNA PROBE: NEGATIVE
NEISSERIA GONORRHEA: NEGATIVE
WET PREP (BD AFFIRM): NEGATIVE

## 2015-03-22 NOTE — Telephone Encounter (Signed)
Pt aware of results. [place] schedule

## 2015-03-22 NOTE — Telephone Encounter (Signed)
-----   Message from Boykin Nearing, MD sent at 03/22/2015 11:19 AM EDT ----- Negative wet prep

## 2015-03-22 NOTE — Telephone Encounter (Signed)
-----   Message from Boykin Nearing, MD sent at 03/19/2015  8:15 AM EDT ----- Elevated liver enzymes, will f/u abdominal ultrasound  Normal CBC

## 2015-03-26 ENCOUNTER — Ambulatory Visit (HOSPITAL_COMMUNITY)
Admission: RE | Admit: 2015-03-26 | Discharge: 2015-03-26 | Disposition: A | Discharge status: Home / Self Care | Payer: Medicaid Other | Source: Ambulatory Visit | Attending: Family Medicine | Admitting: Family Medicine

## 2015-03-26 DIAGNOSIS — K8020 Calculus of gallbladder without cholecystitis without obstruction: Secondary | ICD-10-CM | POA: Insufficient documentation

## 2015-03-26 DIAGNOSIS — R932 Abnormal findings on diagnostic imaging of liver and biliary tract: Secondary | ICD-10-CM | POA: Insufficient documentation

## 2015-03-26 DIAGNOSIS — R109 Unspecified abdominal pain: Secondary | ICD-10-CM

## 2015-03-29 ENCOUNTER — Other Ambulatory Visit: Payer: Self-pay | Admitting: Family Medicine

## 2015-03-29 DIAGNOSIS — R109 Unspecified abdominal pain: Secondary | ICD-10-CM

## 2015-03-29 NOTE — Assessment & Plan Note (Signed)
Gallstones in gallbladder  Plan for low fat diet gen surg referral

## 2015-04-06 ENCOUNTER — Telehealth: Payer: Self-pay | Admitting: Internal Medicine

## 2015-04-06 NOTE — Telephone Encounter (Signed)
Patient called to request her ultrasound results. Please f/u with patient

## 2015-04-07 NOTE — Telephone Encounter (Signed)
-----   Message from Boykin Nearing, MD sent at 03/22/2015  5:08 PM EDT ----- Negative GC/chalm

## 2015-04-07 NOTE — Telephone Encounter (Signed)
-----   Message from Boykin Nearing, MD sent at 03/29/2015  9:51 AM EDT ----- There are a lot of stones in the gallbladder w/o inflammation. Plan gen surg referral to discuss elective gall bladder removal Recommend low fat diet as fat can trigger pain from gallbladder

## 2015-04-07 NOTE — Telephone Encounter (Signed)
Pt ware or results  Advised to maintain a good diet Referral was placed

## 2015-04-26 ENCOUNTER — Other Ambulatory Visit: Payer: Self-pay | Admitting: General Surgery

## 2015-05-31 ENCOUNTER — Ambulatory Visit: Payer: Self-pay

## 2015-09-02 DIAGNOSIS — Z713 Dietary counseling and surveillance: Secondary | ICD-10-CM

## 2015-09-02 NOTE — Congregational Nurse Program (Signed)
Congregational Nurse Program Note  Date of Encounter: 09/02/2015  Past Medical History: Past Medical History  Diagnosis Date  . Headache(784.0)   . Urinary tract infection     Encounter Details:     CNP Questionnaire - 09/02/15 2205    Patient Demographics   Is this a new or existing patient? New   Patient is considered a/an Not Applicable   Patient Assistance   Patient's financial/insurance status Low Income   Patient referred to apply for the following financial assistance Not Applicable   Food insecurities addressed Not Applicable   Transportation assistance No   Assistance securing medications No   Educational health offerings Nutrition;Exercise/physical activity   Encounter Details   Primary purpose of visit Education/Health Concerns   Was an Emergency Department visit averted? No   Does patient have a medical provider? Yes   Patient referred to Not Applicable   Was a mental health screening completed? (GAINS tool) No   Does patient have dental issues? No   Since previous encounter, have you referred patient for abnormal blood pressure that resulted in a new diagnosis or medication change? No   Since previous encounter, have you referred patient for abnormal blood glucose that resulted in a new diagnosis or medication change? No      Client attends [employer].  Client wanted to weigh today.  Reports that she has lost from 190-163 Over about 3 months.  Has significantly changed her eating havits.  Being hispanic, she has hx of eating a lot of tortillas and she has changed the amt she eats, decreasing amt of sweets she eats and walking every day.   Discussed importance of fruits and veggies. Praised for the progress she has made.  Will followup with her monthly at least to monitor progress.

## 2015-09-19 DIAGNOSIS — Z713 Dietary counseling and surveillance: Secondary | ICD-10-CM

## 2015-09-19 NOTE — Congregational Nurse Program (Signed)
Congregational Nurse Program Note  Date of Encounter: 09/19/2015  Past Medical History: Past Medical History  Diagnosis Date  . Headache(784.0)   . Urinary tract infection     Encounter Details:     CNP Questionnaire - 09/19/15 2307    Patient Demographics   Patient is considered a/an Not Applicable   Patient Assistance   Patient's financial/insurance status Low Income   Patient referred to apply for the following financial assistance Not Applicable   Food insecurities addressed Not Applicable   Transportation assistance No   Assistance securing medications No   Educational health offerings Health literacy   Encounter Details   Primary purpose of visit Education/Health Concerns;Navigating the Healthcare System   Was an Emergency Department visit averted? No   Does patient have a medical provider? Yes   Patient referred to Not Applicable   Was a mental health screening completed? (GAINS tool) No   Does patient have dental issues? No   Since previous encounter, have you referred patient for abnormal blood pressure that resulted in a new diagnosis or medication change? No   Since previous encounter, have you referred patient for abnormal blood glucose that resulted in a new diagnosis or medication change? No       Client seen on 09/16/15 to discuss flu vaccine.  Is interested but does not know if given in dr office.  Checked chart in epic and no vaccine noted for her PCP.  Client did not return after class to get vaccine.  Will see next week and give flu vaccine if she is willing.

## 2015-09-23 DIAGNOSIS — Z23 Encounter for immunization: Secondary | ICD-10-CM

## 2015-09-23 NOTE — Congregational Nurse Program (Signed)
Congregational Nurse Program Note  Date of Encounter: 09/23/2015  Past Medical History: Past Medical History  Diagnosis Date  . Headache(784.0)   . Urinary tract infection     Encounter Details:     CNP Questionnaire - 09/23/15 1426    Patient Demographics   Is this a new or existing patient? Existing   Patient is considered a/an Immigrant   Patient Assistance   Patient's financial/insurance status Low Income;[place] Card/Care Connects   Patient referred to apply for the following financial assistance [employer] insecurities addressed Not Applicable   Transportation assistance No   Assistance securing medications No   Educational health offerings Exercise/physical activity;Health literacy   Encounter Details   Primary purpose of visit Education/Health Concerns;Spiritual Care/Support Visit   Was an Emergency Department visit averted? No   Does patient have a medical provider? Yes   Patient referred to Not Applicable   Was a mental health screening completed? (GAINS tool) No   Does patient have dental issues? No   Since previous encounter, have you referred patient for abnormal blood pressure that resulted in a new diagnosis or medication change? No   Since previous encounter, have you referred patient for abnormal blood glucose that resulted in a new diagnosis or medication change? No     Client seen in [facility].  Flu vaccine given. See note, client asked about concerns surrounding the election and more specifically regarding immigration issues.  Client reports some anxiety with that regard, but not overly concerned at this point.  Allowed to ventilate.  Will followup as needed.  Will followup with referring to renew orange card

## 2015-12-10 ENCOUNTER — Ambulatory Visit: Payer: Self-pay | Attending: Internal Medicine | Admitting: Internal Medicine

## 2015-12-10 ENCOUNTER — Encounter: Payer: Self-pay | Admitting: Internal Medicine

## 2015-12-10 ENCOUNTER — Other Ambulatory Visit: Payer: Self-pay | Admitting: Pediatrics

## 2015-12-10 ENCOUNTER — Other Ambulatory Visit: Payer: Self-pay

## 2015-12-10 VITALS — BP 112/75 | HR 80 | Temp 98.0°F | Resp 17 | Ht 63.0 in | Wt 171.2 lb

## 2015-12-10 DIAGNOSIS — F419 Anxiety disorder, unspecified: Secondary | ICD-10-CM | POA: Insufficient documentation

## 2015-12-10 DIAGNOSIS — Z0000 Encounter for general adult medical examination without abnormal findings: Secondary | ICD-10-CM

## 2015-12-10 DIAGNOSIS — M7989 Other specified soft tissue disorders: Secondary | ICD-10-CM | POA: Insufficient documentation

## 2015-12-10 DIAGNOSIS — N898 Other specified noninflammatory disorders of vagina: Secondary | ICD-10-CM | POA: Insufficient documentation

## 2015-12-10 DIAGNOSIS — R0602 Shortness of breath: Secondary | ICD-10-CM | POA: Insufficient documentation

## 2015-12-10 DIAGNOSIS — N926 Irregular menstruation, unspecified: Secondary | ICD-10-CM

## 2015-12-10 DIAGNOSIS — Z20818 Contact with and (suspected) exposure to other bacterial communicable diseases: Secondary | ICD-10-CM

## 2015-12-10 DIAGNOSIS — R000 Tachycardia, unspecified: Secondary | ICD-10-CM

## 2015-12-10 DIAGNOSIS — Z888 Allergy status to other drugs, medicaments and biological substances status: Secondary | ICD-10-CM | POA: Insufficient documentation

## 2015-12-10 DIAGNOSIS — Z79899 Other long term (current) drug therapy: Secondary | ICD-10-CM | POA: Insufficient documentation

## 2015-12-10 LAB — POCT URINALYSIS DIPSTICK
Bilirubin, UA: NEGATIVE
Blood, UA: NEGATIVE
Glucose, UA: NEGATIVE
Ketones, UA: NEGATIVE
Leukocytes, UA: NEGATIVE
Nitrite, UA: NEGATIVE
PROTEIN UA: NEGATIVE
Spec Grav, UA: 1.025
Urobilinogen, UA: 2
pH, UA: 6.5

## 2015-12-10 LAB — POCT GLYCOSYLATED HEMOGLOBIN (HGB A1C): Hemoglobin A1C: 5.2

## 2015-12-10 LAB — POCT URINE PREGNANCY: PREG TEST UR: NEGATIVE

## 2015-12-10 MED ORDER — AZITHROMYCIN 250 MG PO TABS: ORAL_TABLET | ORAL | Status: DC

## 2015-12-10 MED ORDER — CLONAZEPAM 0.5 MG PO TABS: 0.5000 mg | ORAL_TABLET | Freq: Two times a day (BID) | ORAL | Status: DC | PRN

## 2015-12-10 MED ORDER — METRONIDAZOLE 500 MG PO TABS: 500.0000 mg | ORAL_TABLET | Freq: Two times a day (BID) | ORAL | Status: DC

## 2015-12-10 NOTE — Patient Instructions (Signed)
Vaginosis bacteriana (Bacterial Vaginosis) La vaginosis bacteriana es una infeccin vaginal que perturba el equilibrio normal de las bacterias que se encuentran en la vagina. Es el resultado de un crecimiento excesivo de ciertas bacterias. Esta es la infeccin vaginal ms frecuente en mujeres en edad reproductiva. El tratamiento es importante para prevenir complicaciones, especialmente en mujeres embarazadas, dado que puede causar un parto prematuro. CAUSAS  La vaginosis bacteriana se origina por un aumento de bacterias nocivas que, generalmente, estn presentes en cantidades ms pequeas en la vagina. Varios tipos diferentes de bacterias pueden causar esta afeccin. Sin embargo, la causa de su desarrollo no se comprende totalmente. El [place] o comportamientos pueden exponerlo a un mayor riesgo de desarrollar vaginosis bacteriana, entre los que se incluyen:  Tener una nueva pareja sexual o mltiples parejas sexuales.  Las duchas vaginales  El uso del DIU (dispositivo intrauterino) como mtodo anticonceptivo. El contagio no se produce en baos, por ropas de cama, en piscinas o por contacto con objetos. SIGNOS Y SNTOMAS  Algunas mujeres que padecen vaginosis bacteriana no presentan signos ni sntomas. Los sntomas ms comunes son:  Secrecin vaginal de color grisceo.  Secrecin vaginal con olor similar al [employer], especialmente despus de Retail banker.  Picazn o sensacin de ardor en la vagina o la vulva.  Ardor o dolor al [employer]. DIAGNSTICO  Su mdico analizar su historia clnica y le examinar la vagina para detectar signos de vaginosis bacteriana. Puede tomarle Truddie Coco de flujo vaginal. Su mdico examinar esta muestra con un microscopio para controlar las bacterias y clulas anormales. Tambin puede realizarse un anlisis del pH vaginal.  TRATAMIENTO  La vaginosis bacteriana puede tratarse con antibiticos, en forma de comprimidos o  de crema vaginal. Puede indicarse una segunda tanda de antibiticos si la afeccin se repite despus del tratamiento. Debido a que la vaginosis bacteriana aumenta el riesgo de contraer enfermedades de transmisin sexual, el tratamiento puede ayudar a reducir el riesgo de clamidia, [place], VIH y herpes. [place] solo medicamentos de venta libre o recetados, segn las indicaciones del mdico.  Si le han recetado antibiticos, tmelos como se le indic. Asegrese de que finaliza la prescripcin completa aunque se sienta mejor.  Comunique a sus compaeros sexuales que sufre una infeccin vaginal. Deben consultar a su mdico y recibir tratamiento si tienen problemas, como picazn o una erupcin cutnea leve.  Durante el [place], es importante que siga estas indicaciones:  Visual merchandiser relaciones sexuales o use preservativos de la forma correcta.  No se haga duchas vaginales.  Evite consumir alcohol como se lo haya indicado el mdico.  Community education officer se lo haya indicado el mdico. SOLICITE ATENCIN MDICA SI:   Sus sntomas no mejoran despus de 3 das de [place].  Aumenta la secrecin o Conservation officer, historic buildings.  Tiene fiebre. ASEGRESE DE QUE:   Comprende estas instrucciones.  Controlar su afeccin.  Recibir ayuda de inmediato si no mejora o si empeora. PARA OBTENER MS INFORMACIN  Centros para el control y la prevencin de Probation officer for Disease Control and Prevention, CDC): AppraiserFraud.fi Asociacin Estadounidense de la Salud Sexual (American Sexual Health Association, SHA): www.ashastd.org    Esta informacin no tiene Marine scientist el consejo del mdico. Asegrese de hacerle al mdico cualquier pregunta que tenga.   Document Released: 02/06/2008 Document Revised: 11/20/2014 Elsevier Interactive Patient Education [employer].

## 2015-12-10 NOTE — Progress Notes (Signed)
Patient complains of having some SOB and rapid heart beat Patient also complains of not having her period since November and  Having a yellowish white discharge with an odor

## 2015-12-10 NOTE — Progress Notes (Signed)
Patient ID: Debra Allen, female   DOB: 1986-07-29, 29 y.o.   MRN: [id number]  CC: SOB, fast heart beat  HPI: Debra Allen is a 30 y.o. female here today for a follow up visit.  Patient has past medical history of headaches. Patient reports that she had her IUD placed 4 months ago and now she is not having periods. She is concerned something is wrong since she had a negative pregnancy test. She reports a vaginal discharge that is white and creamy. She is concerned about SOB that happens daily. She exercises 2-3 times per week and does not feel her shortness of breath is related to exercising. Choice of breath issues are worse at night. SOB comes at anytime not realted to exertion. Patient has shortness of breath she complains of pain swelling, feet swelling, and fast heartbeat. Patient admits to feelings of anxiety over multiple stressors.  Allergies  Allergen Reactions  . Keflex [Cephalexin] Itching and Rash   Past Medical History  Diagnosis Date  . Headache(784.0)   . Urinary tract infection    Current Outpatient Prescriptions on File Prior to Visit  Medication Sig Dispense Refill  . acetaminophen (TYLENOL) 325 MG tablet Take 650 mg by mouth every 6 (six) hours as needed.    . ferrous sulfate (FERROUSUL) 325 (65 FE) MG tablet Take 1 tablet (325 mg total) by mouth 3 (three) times daily with meals. 90 tablet 3  . ibuprofen (ADVIL,MOTRIN) 600 MG tablet Take 1 tablet (600 mg total) by mouth every 8 (eight) hours as needed. 30 tablet 0  . levonorgestrel (MIRENA) 20 MCG/24HR IUD 1 each by Intrauterine route once. Lot # [id number], Exp 06/18. Due for removal in 5 years 01/01/2020    . Multiple Vitamins-Iron (MULTIVITAMINS WITH IRON) TABS tablet Take 1 tablet by mouth daily. 90 tablet 1  . Vitamin D, Ergocalciferol, (DRISDOL) 50000 UNITS CAPS capsule Take 1 capsule (50,000 Units total) by mouth every 7 (seven) days. 12 capsule 0   Current Facility-Administered Medications on File  Prior to Visit  Medication Dose Route Frequency Provider Last Rate Last Dose  . levonorgestrel (MIRENA) 20 MCG/24HR IUD   Intrauterine Once Boykin Nearing, MD       Family History  Problem Relation Age of Onset  . Other Neg Hx   . Diabetes Mother   . Hypertension Maternal Grandmother   . Diabetes Maternal Grandmother   . Cancer Paternal Grandmother   . Cancer Paternal Grandfather   . Hypertension Father   . Cancer Paternal Aunt     bladder cancer   . Asthma Other   . Obesity Other   . Sleep apnea Other   . Heart disease Other    Social History   Social History  . Marital Status: Single    Spouse Name: N/A  . Number of Children: N/A  . Years of Education: N/A   Occupational History  . Not on file.   Social History Main Topics  . Smoking status: Never Smoker   . Smokeless tobacco: Never Used  . Alcohol Use: No  . Drug Use: No  . Sexual Activity: Yes    Birth Control/ Protection: None   Other Topics Concern  . Not on file   Social History Narrative    Review of Systems: Other than what is stated in HPI, all other systems are negative.   Objective:   Filed Vitals:   12/10/15 1646  BP: 112/75  Pulse: 80  Temp: 98 F (36.7 C)  Resp:  17    Physical Exam  Constitutional: She is oriented to person, place, and time.  Cardiovascular: Normal rate, regular rhythm and normal heart sounds.   Pulmonary/Chest: Effort normal and breath sounds normal. She exhibits no tenderness.  Genitourinary: Uterus normal. Cervix exhibits no motion tenderness, no discharge and no friability. Right adnexum displays no tenderness. Left adnexum displays no tenderness. Vaginal discharge found.  Lymphadenopathy:       Left: No inguinal adenopathy present.  Neurological: She is alert and oriented to person, place, and time.  Skin: Skin is warm and dry.     Lab Results  Component Value Date   WBC 7.0 03/18/2015   HGB 12.7 03/18/2015   HCT 37.7 03/18/2015   MCV 82.9 03/18/2015    PLT 223 03/18/2015   Lab Results  Component Value Date   CREATININE 0.48* 03/18/2015   BUN 12 03/18/2015   NA 137 03/18/2015   K 4.5 03/18/2015   CL 101 03/18/2015   CO2 28 03/18/2015    Lab Results  Component Value Date   HGBA1C 5.20 12/10/2015   Lipid Panel     Component Value Date/Time   CHOL 160 10/07/2013 1038   TRIG 59 10/07/2013 1038   HDL 47 10/07/2013 1038   CHOLHDL 3.4 10/07/2013 1038   VLDL 12 10/07/2013 1038   LDLCALC 101* 10/07/2013 1038       Assessment and plan:   Debra Allen was seen today for shortness of breath.  Diagnoses and all orders for this visit:  Missed period -     POCT urine pregnancy Complained that when using the Mirena IUD there is a chance that it will cease. Pregnancy test negative  Vaginal discharge -     Urinalysis Dipstick -     metroNIDAZOLE (FLAGYL) 500 MG tablet; Take 1 tablet (500 mg total) by mouth 2 (two) times daily. -     Cervicovaginal ancillary only Explained that bacterial vaginosis is common after IUD placement. Will treat today  Rapid heart rate -     EKG 12-Lead -     TSH; Future -     Vitamin D, 25-hydroxy; Future -     Basic Metabolic Panel; Future EKG is normal sinus rhythm. Feelings of rapid heart rate likely related to anxiety  Anxiety -     Begin clonazePAM (KLONOPIN) 0.5 MG tablet; Take 1 tablet (0.5 mg total) by mouth 2 (two) times daily as needed for anxiety. Went over ways to minimize stress but occupied time with things she likes to do. Will try her on a short course of clonazepam if no improvement within the next 6 weeks may start patient on low-dose SSRI.  Healthcare maintenance -     HgB A1c   Due to language barrier, an interpreter was present during the history-taking and subsequent discussion (and for part of the physical exam) with this patient.  Return for Monday--lab visit.        Lance Bosch, [facility] [phone] 12/10/2015, 5:03 PM

## 2015-12-10 NOTE — Progress Notes (Signed)
Child of this mother with pcr proven pertussis.  Mother denies allergies to medicines. reviewd use of medicine.

## 2015-12-13 ENCOUNTER — Other Ambulatory Visit: Payer: Self-pay

## 2015-12-14 ENCOUNTER — Ambulatory Visit: Payer: Self-pay | Attending: Internal Medicine

## 2015-12-14 ENCOUNTER — Telehealth: Payer: Self-pay

## 2015-12-14 DIAGNOSIS — R000 Tachycardia, unspecified: Secondary | ICD-10-CM

## 2015-12-14 LAB — BASIC METABOLIC PANEL
BUN: 8 mg/dL (ref 7–25)
CALCIUM: 9.6 mg/dL (ref 8.6–10.2)
CO2: 25 mmol/L (ref 20–31)
Chloride: 104 mmol/L (ref 98–110)
Creat: 0.41 mg/dL — ABNORMAL LOW (ref 0.50–1.10)
GLUCOSE: 96 mg/dL (ref 65–99)
Potassium: 4.5 mmol/L (ref 3.5–5.3)
Sodium: 138 mmol/L (ref 135–146)

## 2015-12-14 LAB — CERVICOVAGINAL ANCILLARY ONLY
CHLAMYDIA, DNA PROBE: NEGATIVE
NEISSERIA GONORRHEA: NEGATIVE
WET PREP (BD AFFIRM): NEGATIVE

## 2015-12-14 LAB — TSH: TSH: 1.431 u[IU]/mL (ref 0.350–4.500)

## 2015-12-14 MED FILL — ?AZITHROMYCIN 250 MG TABLET: 250 MG | 5 days supply | Qty: 6 | Fill #0

## 2015-12-14 NOTE — Telephone Encounter (Signed)
Interpreter line used Jobie Quaker ID# [id number] Tried to call patient  Patient not available Message left on voice mail to return our call

## 2015-12-14 NOTE — Telephone Encounter (Signed)
-----   Message from Lance Bosch, NP sent at 12/14/2015  1:07 PM EST ----- No vaginal infections

## 2015-12-15 LAB — VITAMIN D 25 HYDROXY (VIT D DEFICIENCY, FRACTURES): Vit D, 25-Hydroxy: 8 ng/mL — ABNORMAL LOW (ref 30–100)

## 2015-12-17 ENCOUNTER — Telehealth: Payer: Self-pay

## 2015-12-17 DIAGNOSIS — E559 Vitamin D deficiency, unspecified: Secondary | ICD-10-CM

## 2015-12-17 MED ORDER — VITAMIN D (ERGOCALCIFEROL) 1.25 MG (50000 UNIT) PO CAPS: 50000.0000 [IU] | ORAL_CAPSULE | ORAL | Status: DC

## 2015-12-17 NOTE — Telephone Encounter (Signed)
Interpreter line used Elita Quick ID# [id number] Patient is aware of her lab results and to pick up her prescription for vit D

## 2015-12-17 NOTE — Telephone Encounter (Signed)
-----   Message from Lance Bosch, NP sent at 12/17/2015  1:08 PM EST ----- Vitamin D is low. Please send drisdol 50,000 IU to take once weekly for 12 weeks. 12 tablets no refills.

## 2016-02-28 DIAGNOSIS — L989 Disorder of the skin and subcutaneous tissue, unspecified: Secondary | ICD-10-CM

## 2016-03-02 DIAGNOSIS — Z719 Counseling, unspecified: Secondary | ICD-10-CM

## 2016-03-09 DIAGNOSIS — Z711 Person with feared health complaint in whom no diagnosis is made: Secondary | ICD-10-CM

## 2016-03-16 DIAGNOSIS — Z713 Dietary counseling and surveillance: Secondary | ICD-10-CM

## 2016-03-21 ENCOUNTER — Other Ambulatory Visit: Payer: Self-pay | Admitting: Physician Assistant

## 2016-03-21 ENCOUNTER — Encounter: Payer: Self-pay | Admitting: Physician Assistant

## 2016-03-21 ENCOUNTER — Ambulatory Visit: Payer: Self-pay | Attending: Internal Medicine | Admitting: Physician Assistant

## 2016-03-21 VITALS — BP 110/76 | HR 79 | Temp 97.9°F | Wt 168.2 lb

## 2016-03-21 DIAGNOSIS — R05 Cough: Secondary | ICD-10-CM | POA: Insufficient documentation

## 2016-03-21 DIAGNOSIS — J209 Acute bronchitis, unspecified: Secondary | ICD-10-CM | POA: Insufficient documentation

## 2016-03-21 DIAGNOSIS — Z79899 Other long term (current) drug therapy: Secondary | ICD-10-CM | POA: Insufficient documentation

## 2016-03-21 DIAGNOSIS — Z888 Allergy status to other drugs, medicaments and biological substances status: Secondary | ICD-10-CM | POA: Insufficient documentation

## 2016-03-21 DIAGNOSIS — E559 Vitamin D deficiency, unspecified: Secondary | ICD-10-CM | POA: Insufficient documentation

## 2016-03-21 MED ORDER — FLUTICASONE-SALMETEROL 250-50 MCG/DOSE IN AEPB: 1.0000 | INHALATION_SPRAY | Freq: Two times a day (BID) | RESPIRATORY_TRACT | Status: DC

## 2016-03-21 MED ORDER — MOMETASONE FURO-FORMOTEROL FUM 200-5 MCG/ACT IN AERO: 2.0000 | INHALATION_SPRAY | Freq: Two times a day (BID) | RESPIRATORY_TRACT | Status: DC

## 2016-03-21 MED ORDER — AZITHROMYCIN 250 MG PO TABS: ORAL_TABLET | ORAL | Status: DC

## 2016-03-21 MED FILL — !DULERA 200 MCG/5 MCG INH: 200-5 | 30 days supply | Qty: 13 | Fill #0

## 2016-03-21 MED FILL — ?AZITHROMYCIN 250 MG TABLET: 250 MG | 5 days supply | Qty: 6 | Fill #0

## 2016-03-21 NOTE — Progress Notes (Signed)
Debra Allen, is a 30 y.o. female  [id number]  [id number]  DOB - Jul 05, 1986  Chief Complaint  Patient presents with  . URI    Cough/nasal-yellow;ear pain - L; sinus press;HA;chest congesiton x 1 week        Subjective:  Chief Complaint and HPI: Debra Allen is a 30 y.o. female here today for cough X 1 week and nasal congestion with body aches and subjective fever X 3days.  Some nausea.  No vomiting. No diarrhea.  Coughing up some yellowish green mucus.  No history of asthma.  No recent travel.    History of Vit D deficiency and she took prescription strength vit D for a while.  She wants to get her level checked.     ROS:   Constitutional:  No f/c, No night sweats, No unexplained weight loss. EENT:  No vision changes, No blurry vision, No hearing changes. No mouth, + ear and nasal congestion.  Respiratory: +cough, mild SOB Cardiac: No CP, no palpitations GI:  No abd pain, + N, No V/D. GU: No Urinary s/sx Musculoskeletal: No joint pain Neuro: No headache, no dizziness, no motor weakness.  Skin: No rash Endocrine:  No polydipsia. No polyuria.  Psych: Denies SI/HI   ALLERGIES: Allergies  Allergen Reactions  . Keflex [Cephalexin] Itching and Rash    PAST MEDICAL HISTORY: Past Medical History  Diagnosis Date  . Headache(784.0)   . Urinary tract infection     MEDICATIONS AT HOME: Prior to Admission medications   Medication Sig Start Date End Date Taking? Authorizing Provider  acetaminophen (TYLENOL) 325 MG tablet Take 650 mg by mouth every 6 (six) hours as needed.   Yes Historical Provider, MD  ferrous sulfate (FERROUSUL) 325 (65 FE) MG tablet Take 1 tablet (325 mg total) by mouth 3 (three) times daily with meals. 01/25/15  Yes Lorayne Marek, MD  ibuprofen (ADVIL,MOTRIN) 600 MG tablet Take 1 tablet (600 mg total) by mouth every 8 (eight) hours as needed. 03/18/15  Yes Boykin Nearing, MD  levonorgestrel (MIRENA) 20 MCG/24HR IUD 1 each by  Intrauterine route once. Lot # [id number], Exp 06/18. Due for removal in 5 years 01/01/2020   Yes Historical Provider, MD  Multiple Vitamins-Iron (MULTIVITAMINS WITH IRON) TABS tablet Take 1 tablet by mouth daily. 01/25/15  Yes Lorayne Marek, MD  Vitamin D, Ergocalciferol, (DRISDOL) 50000 units CAPS capsule Take 1 capsule (50,000 Units total) by mouth every 7 (seven) days. 12/17/15  Yes Lance Bosch, NP  azithromycin (ZITHROMAX) 250 MG tablet Take 2 tabs on first day and one tab a day  for four days 03/21/16   Argentina Donovan, PA-C  clonazePAM (KLONOPIN) 0.5 MG tablet Take 1 tablet (0.5 mg total) by mouth 2 (two) times daily as needed for anxiety. Patient not taking: Reported on 03/21/2016 12/10/15   Lance Bosch, NP  Fluticasone-Salmeterol (ADVAIR) 250-50 MCG/DOSE AEPB Inhale 1 puff into the lungs 2 (two) times daily. 03/21/16   Argentina Donovan, PA-C     Objective:  EXAM:   Filed Vitals:   03/21/16 1233  BP: 110/76  Pulse: 79  Temp: 97.9 F (36.6 C)  TempSrc: Oral  Weight: 168 lb 3.2 oz (76.295 kg)  SpO2: 98%    General appearance : A&OX3. NAD. Non-toxic-appearing HEENT: Atraumatic and Normocephalic.  PERRLA. EOM intact.  TM clear B. +nasal congestion with clear rhinorrhea Mouth-MMM, post pharynx WNL w/ mild erythema, No PND. Neck: supple, no JVD. No cervical lymphadenopathy. No thyromegaly Chest/Lungs:  Breathing-non-labored, Good air entry bilaterally, breath sounds without rales or rhonchi.  There is mild wheezing throughout.   CVS: S1 S2 regular, no murmurs, gallops, rubs  Extremities: Bilateral Lower Ext shows no edema, both legs are warm to touch with = pulse throughout Neurology:  CN II-XII grossly intact, Non focal.   Psych:  TP linear. J/I WNL. Normal speech. Appropriate eye contact and affect.  Skin:  No Rash  Data Review Lab Results  Component Value Date   HGBA1C 5.20 12/10/2015     Assessment & Plan   1. Acute bronchitis, unspecified organism Fluids, rest,  respiratory care.  Tylenol or advil for fever/bodyaches.  Flonase may be helpful for nasal congestion - azithromycin (ZITHROMAX) 250 MG tablet; Take 2 tabs on first day and one tab a day  for four days  Dispense: 6 each; Refill: 0 - Fluticasone-Salmeterol (ADVAIR) 250-50 MCG/DOSE AEPB; Inhale 1 puff into the lungs 2 (two) times daily.  Dispense: 60 each; Refill: 0  2. Vitamin D deficiency - Vitamin D, 25-hydroxy   Patient have been counseled extensively about nutrition and exercise  Return in about 3 months (around 06/21/2016).  She should RTC in 48 hrs if not improving or to ER if worsens.   The patient was given clear instructions to go to ER or return to medical center if symptoms don't improve, worsen or new problems develop. The patient verbalized understanding. The patient was told to call to get lab results if they haven't heard anything in the next week.     Freeman Caldron, PA-C [facility] [place], [place]   03/21/2016, 1:53 PM

## 2016-03-22 LAB — VITAMIN D 25 HYDROXY (VIT D DEFICIENCY, FRACTURES): Vit D, 25-Hydroxy: 24 ng/mL — ABNORMAL LOW (ref 30–100)

## 2016-03-22 NOTE — Congregational Nurse Program (Signed)
Congregational Nurse Program Note  Date of Encounter: 03/09/2016  Past Medical History: Past Medical History  Diagnosis Date  . Headache(784.0)   . Urinary tract infection     Encounter Details:     CNP Questionnaire - 03/21/16 2347    Patient Demographics   Patient is considered a/an Immigrant         Amb Nursing Assessment - 03/21/16 1235    Pre-visit preparation   Pre-visit preparation completed Yes   Abuse/Neglect Assessment   Do you feel unsafe in your current relationship? No   Do you feel physically threatened by others? No   Unable to ask? No   Patient Literacy   How often do you need to have someone help you when you read instructions, pamphlets, or other written materials from your doctor or pharmacy? 1 - Never   Investment banker, operational Needed? No    Client reports she is still concerned about spot on her leg and reports still painful .  She is worried about skin cancer.  She will call community wellness May 1 to get appt.  Also has been helping refugee family with food, resources and help in the community.

## 2016-05-11 DIAGNOSIS — Z713 Dietary counseling and surveillance: Secondary | ICD-10-CM

## 2016-05-19 NOTE — Congregational Nurse Program (Signed)
Congregational Nurse Program Note  Date of Encounter: 02/28/2016  Past Medical History: Past Medical History  Diagnosis Date  . Headache(784.0)   . Urinary tract infection     Encounter Details:   Client has area on leg that has been painful.  She has had it looked at by dr.  And was to be referred to dermatologist.  She is to call Dr  And ask for referral.  She will call herself.  Will followup as needed

## 2016-05-19 NOTE — Congregational Nurse Program (Signed)
Congregational Nurse Program Note  Date of Encounter: 03/02/2016  Past Medical History: Past Medical History  Diagnosis Date  . Headache(784.0)   . Urinary tract infection     Encounter Details: Client concerned about weight and eating right.  Trying to do "better".  Concerned about daughter and family.  Will offer support as needed.

## 2016-05-20 NOTE — Congregational Nurse Program (Signed)
Congregational Nurse Program Note  Date of Encounter: 05/11/2016  Past Medical History: Past Medical History  Diagnosis Date  . Headache(784.0)   . Urinary tract infection     Encounter Details:     CNP Questionnaire - 05/11/16 2330    Patient Demographics   Is this a new or existing patient? Existing   Patient is considered a/an Immigrant   Race Latino/Hispanic   Patient Assistance   Location of Patient [place]   Patient's financial/insurance status [facility];Low Income   Uninsured Patient No   Patient referred to apply for the following financial assistance [place] insecurities addressed Not Applicable   Transportation assistance No   Assistance securing medications No   Educational Programmer, multimedia the healthcare system;Nutrition   Encounter Details   Primary purpose of visit Navigating the Healthcare System;Education/Health Concerns   Was an Emergency Department visit averted? No   Does patient have a medical provider? Yes   Patient referred to Not Applicable   Was a mental health screening completed? (GAINS tool) No   Does patient have dental issues? No   Does patient have vision issues? No   Does your patient have an abnormal blood pressure today? No   Since previous encounter, have you referred patient for abnormal blood pressure that resulted in a new diagnosis or medication change? No   Does your patient have an abnormal blood glucose today? No   Since previous encounter, have you referred patient for abnormal blood glucose that resulted in a new diagnosis or medication change? No   Was there a life-saving intervention made? No     Client wanted to discuss weight loss.  She wants to lose about 30 pounds.  She feels like she has gained recently.  Discussed healthy eating, exercise.  She hopes to work on these goals.  She was in today to take 2 GED tests.  She was hopeful she had  passed.  Encouraged her to continue work on her weight.  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  She is still helping new comers to the community.  Loves doing this service.  Client smart and very knowledgeable of resources.

## 2016-05-26 ENCOUNTER — Ambulatory Visit: Payer: Self-pay | Attending: Internal Medicine | Admitting: Physician Assistant

## 2016-05-26 ENCOUNTER — Other Ambulatory Visit: Payer: Self-pay

## 2016-05-26 VITALS — BP 99/66 | HR 71 | Temp 98.0°F | Resp 16 | Wt 167.0 lb

## 2016-05-26 DIAGNOSIS — H00014 Hordeolum externum left upper eyelid: Secondary | ICD-10-CM

## 2016-05-26 DIAGNOSIS — H109 Unspecified conjunctivitis: Secondary | ICD-10-CM

## 2016-05-26 DIAGNOSIS — K0889 Other specified disorders of teeth and supporting structures: Secondary | ICD-10-CM

## 2016-05-26 DIAGNOSIS — L819 Disorder of pigmentation, unspecified: Secondary | ICD-10-CM

## 2016-05-26 DIAGNOSIS — D229 Melanocytic nevi, unspecified: Secondary | ICD-10-CM

## 2016-05-26 MED ORDER — DOXYCYCLINE HYCLATE 100 MG PO TABS: 100.0000 mg | ORAL_TABLET | Freq: Two times a day (BID) | ORAL | Status: DC

## 2016-05-26 MED ORDER — CETIRIZINE HCL 10 MG PO TABS: 10.0000 mg | ORAL_TABLET | Freq: Every day | ORAL | Status: DC

## 2016-05-26 MED FILL — DOXYCYCLINE 100 MG TABLET: 100 | 10 days supply | Qty: 20 | Fill #0

## 2016-05-26 MED FILL — ?CETIRIZINE HCL 10 MG TABLE: 10 | 30 days supply | Qty: 30 | Fill #0

## 2016-05-26 NOTE — Progress Notes (Signed)
Patient ID: Debra Allen, female   DOB: 1986-02-27, 30 y.o.   MRN: [id number]   Debra Allen, is a 30 y.o. female  [id number]  [id number]  DOB - December 06, 1985  Chief Complaint  Patient presents with  . Conjunctivitis        Subjective:  Chief Complaint and HPI: Debra Allen is a 30 y.o. female here today for L eye itching, swelling, and tearing after going to the [facility] to get her driver's license a few days ago and having to press her eyes against the [place] machine.  Some pain.  Vision is ok except her eye is more comfortable if she is squinting.  She also has runny nose, congestion, some sneezing, and mild sore throat.  Her symptoms have been developing over the last 5 days or so.  Over the last day or 2, she has noticed a tender "bump" on her L upper eyelid.  No sinus pain or pressure.  No sinus drainage.  No f/c.  No purulent drainage from L eye.   She also c/o "mole" that she has had for years that seems to be changing over the last year or so.  It has not been bleeding.     ROS:   Constitutional:  No f/c, No night sweats, No unexplained weight loss. EENT:  No vision changes, Slightly blurry vision, No hearing changes. +mild ST, rhinorrhea Respiratory: No cough, No SOB Cardiac: No CP, no palpitations GI:  No abd pain, No N/V/D. GU: No Urinary s/sx Musculoskeletal: No joint pain Neuro: No headache, no dizziness, no motor weakness.  Skin: +concerning lesion/mole Endocrine:  No polydipsia. No polyuria.  Psych: Denies SI/HI  No problems updated.  ALLERGIES: Allergies  Allergen Reactions  . Keflex [Cephalexin] Itching and Rash    PAST MEDICAL HISTORY: Past Medical History  Diagnosis Date  . Headache(784.0)   . Urinary tract infection     MEDICATIONS AT HOME: Prior to Admission medications   Medication Sig Start Date End Date Taking? Authorizing Provider  acetaminophen (TYLENOL) 325 MG tablet Take 650 mg by mouth every 6 (six) hours as  needed.   Yes Historical Provider, MD  ferrous sulfate (FERROUSUL) 325 (65 FE) MG tablet Take 1 tablet (325 mg total) by mouth 3 (three) times daily with meals. 01/25/15  Yes Lorayne Marek, MD  levonorgestrel (MIRENA) 20 MCG/24HR IUD 1 each by Intrauterine route once. Lot # [id number], Exp 06/18. Due for removal in 5 years 01/01/2020   Yes Historical Provider, MD  Multiple Vitamins-Iron (MULTIVITAMINS WITH IRON) TABS tablet Take 1 tablet by mouth daily. 01/25/15  Yes Lorayne Marek, MD  cetirizine (ZYRTEC) 10 MG tablet Take 1 tablet (10 mg total) by mouth daily. 05/26/16   Argentina Donovan, PA-C  doxycycline (VIBRA-TABS) 100 MG tablet Take 1 tablet (100 mg total) by mouth 2 (two) times daily. 05/26/16   Argentina Donovan, PA-C  ibuprofen (ADVIL,MOTRIN) 600 MG tablet Take 1 tablet (600 mg total) by mouth every 8 (eight) hours as needed. Patient not taking: Reported on 05/26/2016 03/18/15   Boykin Nearing, MD  mometasone-formoterol (DULERA) 200-5 MCG/ACT AERO Inhale 2 puffs into the lungs 2 (two) times daily. Patient not taking: Reported on 05/26/2016 03/21/16   Argentina Donovan, PA-C     Objective:  EXAM:   Filed Vitals:   05/26/16 1016  BP: 99/66  Pulse: 71  Temp: 98 F (36.7 C)  TempSrc: Oral  Resp: 16  Weight: 167 lb (75.751 kg)  SpO2: 98%  General appearance : A&OX3. NAD. Non-toxic-appearing HEENT: Atraumatic and Normocephalic.  PERRLA. EOM intact.  The conjunctivae is overall normal B except minimal injection of the L bulbar conjunctiva. Both upper and lower lid are slightly swollen There is a hordeolum of the upper lid, medial canthus that is tender to the touch. TM clear B.  No nasal lesions.  +preauricular node on L.  Mouth-MMM, post pharynx WNL w/ minimal erythema, No PND. Neck: supple, no JVD. No cervical lymphadenopathy. No thyromegaly Chest/Lungs:  Breathing-non-labored, Good air entry bilaterally, breath sounds normal without rales, rhonchi, or wheezing  CVS: S1 S2 regular, no  murmurs, gallops, rubs  Extremities: Bilateral Lower Ext shows no edema, both legs are warm to touch with = pulse throughout Neurology:  CN II-XII grossly intact, Non focal.   Psych:  TP linear. J/I WNL. Normal speech. Appropriate eye contact and affect.  Skin:  L calf-there is 1 well demarcated and well-circumscribed lesion that is not erythematous or indurated.  It is flesh, tan, with hints of violet.  It is papular/nodular.  No bleeding or irregular borders.   Data Review Lab Results  Component Value Date   HGBA1C 5.20 12/10/2015     Assessment & Plan   1. Conjunctivitis of left eye with pruritis - cetirizine (ZYRTEC) 10 MG tablet; Take 1 tablet (10 mg total) by mouth daily.  Dispense: 30 tablet; Refill: 11  2. 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 Hordeolum externum of left upper eyelid - doxycycline (VIBRA-TABS) 100 MG tablet; Take 1 tablet (100 mg total) by mouth 2 (two) times daily.  Dispense: 20 tablet; Refill: 0  Patient have been counseled extensively about nutrition and exercise  Return in about 6 months (around 11/26/2016).  The patient was given clear instructions to go to ER or return to medical center if symptoms don't improve, worsen or new problems develop. The patient verbalized understanding. The patient was told to call to get lab results if they haven't heard anything in the next week.     Freeman Caldron, PA-C [facility] [place], [place]   05/26/2016, 1:34 PM

## 2016-05-26 NOTE — Patient Instructions (Signed)
Conjuntivitis viral (Viral Conjunctivitis) La conjuntivitis viral es la inflamacin de la membrana transparente que cubre la parte blanca del ojo y la cara interna del prpado (conjuntiva). La inflamacin es causada por una infeccin viral. Los vasos sanguneos en la conjuntiva se inflaman, el ojo se vuelve de color rojo o rosa y a Proofreader. La conjuntivitis viral se puede transmitir fcilmente de una persona a otra (contagiosa). CAUSAS  La conjuntivitis viral es causada por un virus. Un virus es un tipo de microorganismo contagioso. Puede propagarse al tocar [employer] contaminados con el virus, como las manijas de las puertas o las toallas.  SNTOMAS  Los sntomas de la conjuntivitis viral pueden incluir lo siguiente:   Ojos rojos.  Lagrimeo u ojos llorosos.  Picazn en los ojos.  Sensacin de ardor en los ojos.  Secrecin transparente de los ojos.  Hinchazn de los prpados.  Sensacin de [employer].  Sensibilidad a Naval architect. DIAGNSTICO  La conjuntivitis viral se puede diagnosticar mediante la historia clnica y un examen fsico. Si tiene secrecin en el ojo, esta se puede analizar para descartar otras causas de conjuntivitis.  TRATAMIENTO  La conjuntivitis viral no responde a medicamentos utilizados para eliminar las bacterias (antibiticos). El tratamiento de la conjuntivitis viral pretende impedir que se desarrolle una infeccin bacteriana adems de la infeccin viral. El tratamiento tambin apunta a Public house manager sntomas, como la picazn. Esto puede realizar con antihistamnicos en gotas u otros medicamentos para los ojos.  [place] los medicamentos solamente como se lo haya indicado el mdico.  Evite tocarse o frotarse los ojos.  Aplquese un pao clido y limpio en el ojo durante 10 a 20 minutos, 3 a 4 veces al da.  Si [place] lentes de contacto, no los use hasta que se haya desaparecido la inflamacin y su mdico le  indique que es seguro usarlos nuevamente. Pregunte a su mdico cmo esterilizar o volver a colocar sus lentes de contacto antes de usarlos nuevamente. Use anteojos hasta que pueda volver a usar los lentes de [place].  Evite usar [employer] ojos hasta que la inflamacin se haya ido. Descarte cosmticos viejos para los ojos que puedan estar contaminados.  Cambie o lave su almohada todos los [place].  No comparta las toallas o los paos. Esto puede propagar la infeccin.  Lave sus manos frecuentemente con agua y [place]. Use toallas de papel para secarse las manos.  Retire suavemente la secrecin de los ojos con un pao clido y hmedo o con una torunda de algodn.  Tenga mucho cuidado de no tocar el borde del prpado con el frasco de las gotas para los ojos o el tubo de la pomada cuando [employer] medicamentos en el ojo afectado. Esto evitar que se propague la infeccin al otro ojo o a Producer, television/film/video. SOLICITE ATENCIN MDICA SI:   Los sntomas no mejoran con Dispensing optician.  Siente un dolor cada vez ms intenso.  La visin se vuelve borrosa.  Tiene fiebre.  Siente dolor u observa hinchazn o enrojecimiento en la cara.  Aparecen nuevos sntomas.  Los sntomas empeoran.   Esta informacin no tiene Marine scientist el consejo del mdico. Asegrese de hacerle al mdico cualquier pregunta que tenga.   Document Released: 11/20/2014 Elsevier Interactive Patient Education 2016 [place]) Un orzuelo es un bulto en el prpado causado por una infeccin bacteriana. Puede formarse dentro del prpado (orzuelo interno) o fuera  del prpado (orzuelo externo). Un orzuelo interno puede ser causado por una infeccin en una glndula sebcea dentro del prpado. Un orzuelo externo puede estar causado por una infeccin en la base de la pestaa (folculo piloso). Los orzuelos son muy frecuentes. Todas las personas pueden tener orzuelos a Hotel manager. Suelen ocurrir solo en un  ojo, Armed forces training and education officer puede tener ms de [employer].  CAUSAS  La infeccin casi siempre es causada por una bacteria llamada Staphylococcus aureus, que es un tipo comn de bacteria que vive en la piel. FACTORES DE RIESGO Puede tener un riesgo ms alto de sufrir un orzuelo si ya ha tenido [place]. Tambin puede tener un riesgo ms alto si tiene:  Diabetes.  Una enfermedad crnica.  Enrojecimiento prolongado en los ojos.  Una afeccin cutnea denominada seborrea.  Niveles altos de grasa en la sangre (lpidos). [place] dolor en el prpado es el sntoma ms frecuente del [place]. Los orzuelos internos son ms dolorosos que los externos. Otros signos y sntomas pueden incluir los siguientes:  Hinchazn dolorosa del prpado.  Sensacin de [employer].  Lagrimeo y enrojecimiento del ojo.  Pus que drena del orzuelo. DIAGNSTICO  Con tan solo examinarle el ojo, el mdico puede diagnosticarle un [place]. Tambin puede revisarlo para asegurarse de que:  No tenga fiebre ni otros signos de una infeccin ms grave.  La infeccin no se haya diseminado a otras partes del ojo o a zonas circundantes. TRATAMIENTO  La mayora de los orzuelos desparecen en unos das sin [place]. En algunos casos, puede necesitar antibiticos en gotas o ungento para prevenir la infeccin. Es posible que el mdico deba drenar el orzuelo por va quirrgica si este:  Es grande.  Causa mucho dolor.  Interfiere con la visin. Esto se puede realizar con un instrumento cortante de hoja delgada o una aguja.  [place] los medicamentos solamente como se lo haya indicado el mdico.  Aplique una compresa limpia y caliente sobre ojo durante 26minutos, 4veces al Training and development officer.  No use lentes de contacto ni maquillaje para los ojos [employer] el orzuelo se haya curado.  No trate de reventar o drenar el orzuelo. SOLICITE ATENCIN MDICA SI:  Tiene escalofros o  fiebre.  El orzuelo no desaparece despus de varios das.  El orzuelo afecta la visin.  Comienza a Public affairs consultant globo ocular, o se le hincha o enrojece. ASEGRESE DE QUE:  Comprende estas instrucciones.  Controlar su afeccin.  Recibir ayuda de inmediato si no mejora o si empeora.   Esta informacin no tiene Marine scientist el consejo del mdico. Asegrese de hacerle al mdico cualquier pregunta que tenga.   Document Released: 08/09/2005 Document Revised: 11/20/2014 Elsevier Interactive Patient Education [employer].

## 2016-06-15 NOTE — Congregational Nurse Program (Signed)
Congregational Nurse Program Note  Date of Encounter: 03/16/2016  Past Medical History: Past Medical History:  Diagnosis Date  . Headache(784.0)   . Urinary tract infection     Encounter Details:

## 2016-07-27 DIAGNOSIS — Z719 Counseling, unspecified: Secondary | ICD-10-CM

## 2016-08-21 NOTE — Congregational Nurse Program (Signed)
Congregational Nurse Program Note  Date of Encounter: 07/27/2016  Past Medical History: Past Medical History:  Diagnosis Date  . Headache(784.0)   . Urinary tract infection     Encounter Details:     CNP Questionnaire - 07/27/16 2159      Patient Demographics   Is this a new or existing patient? Existing   Patient is considered a/an Immigrant   Race Latino/Hispanic     Patient Assistance   Location of Patient [place]   Patient's financial/insurance status Orange Oncologist;Low Income   Uninsured Patient No   Patient referred to apply for the following financial assistance Not Applicable   Food insecurities addressed Not Applicable   Transportation assistance No   Assistance securing medications No   Educational health offerings Navigating the healthcare system;Nutrition     Encounter Details   Primary purpose of visit Education/Health Concerns   Was an Emergency Department visit averted? No   Does patient have a medical provider? Yes   Patient referred to Not Applicable   Was a mental health screening completed? (GAINS tool) No   Does patient have dental issues? No   Does patient have vision issues? No   Does your patient have an abnormal blood pressure today? No   Since previous encounter, have you referred patient for abnormal blood pressure that resulted in a new diagnosis or medication change? No   Does your patient have an abnormal blood glucose today? No   Since previous encounter, have you referred patient for abnormal blood glucose that resulted in a new diagnosis or medication change? No   Was there a life-saving intervention made? No    Client reports doing well.  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

## 2016-08-28 ENCOUNTER — Encounter (HOSPITAL_COMMUNITY): Payer: Self-pay | Admitting: Emergency Medicine

## 2016-08-28 ENCOUNTER — Ambulatory Visit (HOSPITAL_COMMUNITY)
Admission: EM | Admit: 2016-08-28 | Discharge: 2016-08-28 | Disposition: A | Discharge status: Nursing Home (Includes ICF) | Payer: Self-pay | Attending: Family Medicine | Admitting: Family Medicine

## 2016-08-28 ENCOUNTER — Encounter (HOSPITAL_COMMUNITY): Payer: Self-pay | Admitting: *Deleted

## 2016-08-28 DIAGNOSIS — Z79899 Other long term (current) drug therapy: Secondary | ICD-10-CM | POA: Insufficient documentation

## 2016-08-28 DIAGNOSIS — M79669 Pain in unspecified lower leg: Secondary | ICD-10-CM

## 2016-08-28 DIAGNOSIS — R0789 Other chest pain: Secondary | ICD-10-CM | POA: Insufficient documentation

## 2016-08-28 DIAGNOSIS — R079 Chest pain, unspecified: Secondary | ICD-10-CM

## 2016-08-28 DIAGNOSIS — R0600 Dyspnea, unspecified: Secondary | ICD-10-CM

## 2016-08-28 LAB — COMPREHENSIVE METABOLIC PANEL
ALT: 27 U/L (ref 14–54)
AST: 17 U/L (ref 15–41)
Albumin: 4.4 g/dL (ref 3.5–5.0)
Alkaline Phosphatase: 62 U/L (ref 38–126)
Anion gap: 7 (ref 5–15)
BUN: 11 mg/dL (ref 6–20)
CALCIUM: 9.8 mg/dL (ref 8.9–10.3)
CHLORIDE: 105 mmol/L (ref 101–111)
CO2: 27 mmol/L (ref 22–32)
Creatinine, Ser: 0.49 mg/dL (ref 0.44–1.00)
GFR calc non Af Amer: >60 mL/min (ref >60)
Glucose, Bld: 112 mg/dL — ABNORMAL HIGH (ref 65–99)
Potassium: 4 mmol/L (ref 3.5–5.1)
SODIUM: 139 mmol/L (ref 135–145)
Total Bilirubin: 1 mg/dL (ref 0.3–1.2)
Total Protein: 7.1 g/dL (ref 6.5–8.1)

## 2016-08-28 LAB — CBC WITH DIFFERENTIAL/PLATELET
BASOS PCT: 0 %
Basophils Absolute: 0 10*3/uL (ref 0.0–0.1)
Eosinophils Absolute: 0.2 10*3/uL (ref 0.0–0.7)
Eosinophils Relative: 2 %
HCT: 37.6 % (ref 36.0–46.0)
HEMOGLOBIN: 12.9 g/dL (ref 12.0–15.0)
Lymphocytes Relative: 39 %
Lymphs Abs: 2.6 10*3/uL (ref 0.7–4.0)
MCH: 29.6 pg (ref 26.0–34.0)
MCHC: 34.3 g/dL (ref 30.0–36.0)
MCV: 86.2 fL (ref 78.0–100.0)
MONOS PCT: 4 %
Monocytes Absolute: 0.2 10*3/uL (ref 0.1–1.0)
NEUTROS ABS: 3.7 10*3/uL (ref 1.7–7.7)
NEUTROS PCT: 55 %
Platelets: 214 10*3/uL (ref 150–400)
RBC: 4.36 MIL/uL (ref 3.87–5.11)
RDW: 12.8 % (ref 11.5–15.5)
WBC: 6.7 10*3/uL (ref 4.0–10.5)

## 2016-08-28 LAB — I-STAT TROPONIN, ED: Troponin i, poc: 0 ng/mL (ref 0.00–0.08)

## 2016-08-28 NOTE — ED Triage Notes (Signed)
Pt st's she has been having central chest pain for past 2 weeks but has gotten worse since Fri.  Also c/o nausea with vomiting.

## 2016-08-28 NOTE — ED Triage Notes (Signed)
The patient presented to the [facility] with a complaint of left sided chest pain x 2 weeks. The patient described the pain as a weight or pressure and stated that in the last 3 days it has started to radiate into her left arm and she reported the left hand to be numb. The patient denied nausea but did report increased shortness of breath.

## 2016-08-28 NOTE — ED Provider Notes (Addendum)
[place]    CSN: [id number] Arrival date & time: 08/28/16  1739     History   Chief Complaint Chief Complaint  Patient presents with  . Chest Pain    HPI Debra Allen is a 30 y.o. female.   Is a 30 year old woman, married with 4 children, who works at a Restaurant manager, fast food. She's been having 2 weeks of intermittent shortness of breath, usually worse in the afternoon, associated with sharp stabbing chest pains on the left side primarily but also some substernal tightness.  Significantly patient has bilateral calf tenderness without swelling or edema.  Also significantly, patient has family history of blood clots.  Patient has a Mirena IUD in place.   Patient had these symptoms several years ago. She's not sure what her diagnosis was at that time but she does admit to having quite a bit of anxiety, particularly at work. She's worried about her children and she's worried about her husband as well.      Past Medical History:  Diagnosis Date  . Headache(784.0)   . Urinary tract infection     Patient Active Problem List   Diagnosis Date Noted  . Back pain 03/18/2015  . Abdominal pain 03/18/2015  . 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 Postpartum breast pain 01/08/2015  . Encounter for IUD insertion 12/31/2014  . Anemia 12/31/2014  . History of delivery of macrosomal infant 08/12/2014  . Mildly obese 06/10/2014    Past Surgical History:  Procedure Laterality Date  . NO PAST SURGERIES      OB History    Gravida Para Term Preterm AB Living   4 4 4  0 0 4   SAB TAB Ectopic Multiple Live Births   0 0 0 0 4       Home Medications    Prior to Admission medications   Medication Sig Start Date End Date Taking? Authorizing Provider  levonorgestrel (MIRENA) 20 MCG/24HR IUD 1 each by Intrauterine route once. Lot # [id number], Exp 06/18. Due for removal in 5 years 01/01/2020   Yes Historical Provider, MD  acetaminophen (TYLENOL) 325 MG  tablet Take 650 mg by mouth every 6 (six) hours as needed.    Historical Provider, MD  cetirizine (ZYRTEC) 10 MG tablet Take 1 tablet (10 mg total) by mouth daily. 05/26/16   Argentina Donovan, PA-C  doxycycline (VIBRA-TABS) 100 MG tablet Take 1 tablet (100 mg total) by mouth 2 (two) times daily. 05/26/16   Argentina Donovan, PA-C  ferrous sulfate (FERROUSUL) 325 (65 FE) MG tablet Take 1 tablet (325 mg total) by mouth 3 (three) times daily with meals. 01/25/15   Lorayne Marek, MD  ibuprofen (ADVIL,MOTRIN) 600 MG tablet Take 1 tablet (600 mg total) by mouth every 8 (eight) hours as needed. Patient not taking: Reported on 05/26/2016 03/18/15   Boykin Nearing, MD  mometasone-formoterol (DULERA) 200-5 MCG/ACT AERO Inhale 2 puffs into the lungs 2 (two) times daily. Patient not taking: Reported on 05/26/2016 03/21/16   Argentina Donovan, PA-C  Multiple Vitamins-Iron (MULTIVITAMINS WITH IRON) TABS tablet Take 1 tablet by mouth daily. 01/25/15   Lorayne Marek, MD    Family History Family History  Problem Relation Age of Onset  . Diabetes Mother   . Hypertension Maternal Grandmother   . Diabetes Maternal Grandmother   . Cancer Paternal Grandmother   . Cancer Paternal Grandfather   . Hypertension Father   . Cancer Paternal Aunt     bladder cancer   .  Asthma Other   . Obesity Other   . Sleep apnea Other   . Heart disease Other   . Other Neg Hx     Social History Social History  Substance Use Topics  . Smoking status: Never Smoker  . Smokeless tobacco: Never Used  . Alcohol use No     Allergies   Keflex [cephalexin]   Review of Systems Review of Systems  Constitutional: Negative.   Respiratory: Positive for chest tightness and shortness of breath.   Cardiovascular: Positive for chest pain.  Gastrointestinal: Negative.   Neurological: Positive for numbness and headaches.     Physical Exam Triage Vital Signs ED Triage Vitals  Enc Vitals Group     BP 08/28/16 1834 113/74     Pulse Rate  08/28/16 1834 63     Resp 08/28/16 1834 14     Temp 08/28/16 1834 98.2 F (36.8 C)     Temp Source 08/28/16 1834 Oral     SpO2 08/28/16 1834 100 %     Weight --      Height --      Head Circumference --      Peak Flow --      Pain Score 08/28/16 1913 8     Pain Loc --      Pain Edu? --      Excl. in [place]? --    No data found.   Updated Vital Signs BP 113/74 (BP Location: Left Arm)   Pulse 63   Temp 98.2 F (36.8 C) (Oral)   Resp 14   SpO2 100%   Visual Acuity Right Eye Distance:   Left Eye Distance:   Bilateral Distance:    Right Eye Near:   Left Eye Near:    Bilateral Near:     Physical Exam  Constitutional: She is oriented to person, place, and time. She appears well-developed and well-nourished.  HENT:  Head: Normocephalic.  Right Ear: External ear normal.  Left Ear: External ear normal.  Nose: Nose normal.  Mouth/Throat: Oropharynx is clear and moist.  Eyes: Conjunctivae are normal. Pupils are equal, round, and reactive to light.  Neck: Normal range of motion. Neck supple.  Cardiovascular: Normal rate, regular rhythm, normal heart sounds and intact distal pulses.   Pulmonary/Chest: Effort normal and breath sounds normal.  Musculoskeletal:  Mildly tender calves bilaterally with normal range of motion.  Neurological: She is alert and oriented to person, place, and time.  Skin: Skin is warm and dry.  Nursing note and vitals reviewed.    UC Treatments / Results  Labs (all labs ordered are listed, but only abnormal results are displayed) Labs Reviewed - No data to display  EKG: Normal  EKG Interpretation None       Radiology No results found.  Procedures Procedures (including critical care time)  Medications Ordered in UC Medications - No data to display   Initial Impression / Assessment and Plan / UC Course  I have reviewed the triage vital signs and the nursing notes.  Pertinent labs & imaging results that were available during my care of  the patient were reviewed by me and considered in my medical decision making (see chart for details).  Clinical Course      Final Clinical Impressions(s) / UC Diagnoses   Final diagnoses:  Chest pain, unspecified type  Patient most likely has an anxiety problem. Nevertheless, with calf pain, intermittent sharp chest pain, shortness of breath, and family history of blood clots, pulmonary  emboli need to be considered and therefore I am sending her down to the emergency department for further evaluation.  New Prescriptions Discharge Medication List as of 08/28/2016  8:01 PM       Robyn Haber, MD 08/28/16 2002    Robyn Haber, MD 08/28/16 2003

## 2016-08-29 ENCOUNTER — Emergency Department (HOSPITAL_COMMUNITY)
Admission: EM | Admit: 2016-08-29 | Discharge: 2016-08-29 | Disposition: A | Discharge status: Home / Self Care | Payer: Self-pay | Attending: Emergency Medicine | Admitting: Emergency Medicine

## 2016-08-29 DIAGNOSIS — R079 Chest pain, unspecified: Secondary | ICD-10-CM

## 2016-08-29 LAB — D-DIMER, QUANTITATIVE (NOT AT ARMC): D DIMER QUANT: 0.31 ug{FEU}/mL (ref 0.00–0.50)

## 2016-08-29 MED ORDER — GI COCKTAIL ~~LOC~~: 30.0000 mL | Freq: Once | ORAL | Status: DC

## 2016-08-29 NOTE — Discharge Instructions (Signed)
Try zantac 150mg twice a day.  ° ° °

## 2016-08-29 NOTE — ED Provider Notes (Signed)
[place] DEPT Provider Note   CSN: [id number] Arrival date & time: 08/28/16  2008  By signing my name below, I, Neta Mends, attest that this documentation has been prepared under the direction and in the presence of Deno Etienne, DO . Electronically Signed: Neta Mends, ED Scribe. 08/29/2016. 12:20 AM.    History   Chief Complaint Chief Complaint  Patient presents with  . Chest Pain     The history is provided by the patient. No language interpreter was used.   HPI Comments:  Debra Allen is a 30 y.o. female who presents to the Emergency Department complaining of worsening central chest pain x 2 weeks. Pt states that 4 days ago the chest pain suddenly worsened. Pt states that she became dizzy at work 4 days ago and nearly passed out, and then felt worse the next morning. Pt describes the pain as "heaviness." Pt states that there is some pain radiating to her lower left back and flank. Pt reports that the chest pain is worsened when laying flat. Pt complains of associated SOB, difficulty breathing, dizziness, bilateral hand tingling, difficulty swallowing, generalized fatigue, nausea, vomiting, diarrhea. Pt also complains of bilateral leg pain. Pt states that putting her hands in the air provides temporary mild relief for the chest pain. No other alleviating factors noted. Pt denies other associated symptoms.    Past Medical History:  Diagnosis Date  . Headache(784.0)   . Urinary tract infection     Patient Active Problem List   Diagnosis Date Noted  . Back pain 03/18/2015  . Abdominal pain 03/18/2015  . 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 Postpartum breast pain 01/08/2015  . Encounter for IUD insertion 12/31/2014  . Anemia 12/31/2014  . History of delivery of macrosomal infant 08/12/2014  . Mildly obese 06/10/2014    Past Surgical History:  Procedure Laterality Date  . NO PAST SURGERIES      OB History    Gravida Para Term Preterm AB Living     4 4 4  0 0 4   SAB TAB Ectopic Multiple Live Births   0 0 0 0 4       Home Medications    Prior to Admission medications   Medication Sig Start Date End Date Taking? Authorizing Provider  acetaminophen (TYLENOL) 325 MG tablet Take 650 mg by mouth every 6 (six) hours as needed.    Historical Provider, MD  cetirizine (ZYRTEC) 10 MG tablet Take 1 tablet (10 mg total) by mouth daily. 05/26/16   Argentina Donovan, PA-C  doxycycline (VIBRA-TABS) 100 MG tablet Take 1 tablet (100 mg total) by mouth 2 (two) times daily. 05/26/16   Argentina Donovan, PA-C  ferrous sulfate (FERROUSUL) 325 (65 FE) MG tablet Take 1 tablet (325 mg total) by mouth 3 (three) times daily with meals. 01/25/15   Lorayne Marek, MD  ibuprofen (ADVIL,MOTRIN) 600 MG tablet Take 1 tablet (600 mg total) by mouth every 8 (eight) hours as needed. Patient not taking: Reported on 05/26/2016 03/18/15   Boykin Nearing, MD  levonorgestrel (MIRENA) 20 MCG/24HR IUD 1 each by Intrauterine route once. Lot # [id number], Exp 06/18. Due for removal in 5 years 01/01/2020    Historical Provider, MD  mometasone-formoterol (DULERA) 200-5 MCG/ACT AERO Inhale 2 puffs into the lungs 2 (two) times daily. Patient not taking: Reported on 05/26/2016 03/21/16   Argentina Donovan, PA-C  Multiple Vitamins-Iron (MULTIVITAMINS WITH IRON) TABS tablet Take 1 tablet by mouth daily. 01/25/15  Lorayne Marek, MD    Family History Family History  Problem Relation Age of Onset  . Diabetes Mother   . Hypertension Maternal Grandmother   . Diabetes Maternal Grandmother   . Cancer Paternal Grandmother   . Cancer Paternal Grandfather   . Hypertension Father   . Cancer Paternal Aunt     bladder cancer   . Asthma Other   . Obesity Other   . Sleep apnea Other   . Heart disease Other   . Other Neg Hx     Social History Social History  Substance Use Topics  . Smoking status: Never Smoker  . Smokeless tobacco: Never Used  . Alcohol use No     Allergies   Keflex  [cephalexin]   Review of Systems Review of Systems  Constitutional: Positive for fatigue. Negative for chills and fever.  HENT: Positive for trouble swallowing. Negative for congestion and rhinorrhea.   Eyes: Negative for redness and visual disturbance.  Respiratory: Positive for shortness of breath. Negative for wheezing.   Cardiovascular: Positive for chest pain. Negative for palpitations.  Gastrointestinal: Positive for diarrhea, nausea and vomiting.  Genitourinary: Negative for dysuria and urgency.  Musculoskeletal: Negative for arthralgias and myalgias.  Skin: Negative for pallor and wound.  Neurological: Positive for dizziness. Negative for headaches.       Positive for tingling.  All other systems reviewed and are negative.    Physical Exam Updated Vital Signs BP 104/76 (BP Location: Left Arm)   Pulse 63   Temp 98.2 F (36.8 C) (Oral)   Resp 16   Ht 5\' 3"  (1.6 m)   Wt 166 lb 6 oz (75.5 kg)   SpO2 100%   BMI 29.47 kg/m   Physical Exam  Constitutional: She is oriented to person, place, and time. She appears well-developed and well-nourished. No distress.  HENT:  Head: Normocephalic and atraumatic.  Eyes: EOM are normal. Pupils are equal, round, and reactive to light.  Neck: Normal range of motion. Neck supple.  Cardiovascular: Normal rate and regular rhythm.  Exam reveals no gallop and no friction rub.   No murmur heard. Pulmonary/Chest: Effort normal. She has no wheezes. She has no rales.  Abdominal: Soft. She exhibits no distension. There is no tenderness.  Mild epigastric tenderness  Musculoskeletal: She exhibits no edema or tenderness.  No lower extremity edema; Negative for Homan's sign  Neurological: She is alert and oriented to person, place, and time.  Skin: Skin is warm and dry. She is not diaphoretic.  Psychiatric: She has a normal mood and affect. Her behavior is normal.  Nursing note and vitals reviewed.    ED Treatments / Results  DIAGNOSTIC  STUDIES:  Oxygen Saturation is 100% on RA, normal by my interpretation.    COORDINATION OF CARE:  12:20 AM Discussed treatment plan with pt at bedside and pt agreed to plan.   Labs (all labs ordered are listed, but only abnormal results are displayed) Labs Reviewed  COMPREHENSIVE METABOLIC PANEL - Abnormal; Notable for the following:       Result Value   Glucose, Bld 112 (*)    All other components within normal limits  CBC WITH DIFFERENTIAL/PLATELET  D-DIMER, QUANTITATIVE (NOT AT [facility])  I-STAT TROPOININ, ED    EKG  EKG Interpretation None       Radiology No results found.  Procedures Procedures (including critical care time)  Medications Ordered in ED Medications  gi cocktail (Maalox,Lidocaine,Donnatal) (not administered)     Initial Impression /  Assessment and Plan / ED Course  I have reviewed the triage vital signs and the nursing notes.  Pertinent labs & imaging results that were available during my care of the patient were reviewed by me and considered in my medical decision making (see chart for details).  Clinical Course    30 yo F With a chief complaint of chest pain. This is epigastric in nature and radiates up into the neck. Been going on for the past couple weeks and worsening over the past 3 or 4 days. The patient's symptoms are reproduced with palpation in the epigastrium. She was seen in urgent care earlier today and sent here for rule out of a PE. Of note patient is PERC negative. Will order a ddimer.  DDimer negative, d/c home.   12:45 AM:  I have discussed the diagnosis/risks/treatment options with the patient and family and believe the pt to be eligible for discharge home to follow-up with PCP. We also discussed returning to the ED immediately if new or worsening sx occur. We discussed the sx which are most concerning (e.g., sudden worsening pain, fever, inability to tolerate by mouth) that necessitate immediate return. Medications administered to  the patient during their visit and any new prescriptions provided to the patient are listed below.  Medications given during this visit Medications  gi cocktail (Maalox,Lidocaine,Donnatal) (not administered)     The patient appears reasonably screen and/or stabilized for discharge and I doubt any other medical condition or other [facility] requiring further screening, evaluation, or treatment in the ED at this time prior to discharge.    Final Clinical Impressions(s) / ED Diagnoses   Final diagnoses:  Nonspecific chest pain    New Prescriptions New Prescriptions   No medications on file  I personally performed the services described in this documentation, which was scribed in my presence. The recorded information has been reviewed and is accurate.      Deno Etienne, DO 08/29/16 0045

## 2016-09-12 ENCOUNTER — Inpatient Hospital Stay: Payer: Self-pay | Admitting: Family Medicine

## 2016-10-02 ENCOUNTER — Encounter: Payer: Self-pay | Admitting: Licensed Clinical Social Worker

## 2016-10-02 ENCOUNTER — Encounter: Payer: Self-pay | Admitting: Physician Assistant

## 2016-10-02 ENCOUNTER — Ambulatory Visit: Payer: Self-pay | Attending: Internal Medicine | Admitting: Physician Assistant

## 2016-10-02 VITALS — BP 99/64 | HR 77 | Temp 98.4°F | Resp 18 | Ht 63.0 in | Wt 162.0 lb

## 2016-10-02 DIAGNOSIS — R0789 Other chest pain: Secondary | ICD-10-CM | POA: Insufficient documentation

## 2016-10-02 DIAGNOSIS — Z79899 Other long term (current) drug therapy: Secondary | ICD-10-CM | POA: Insufficient documentation

## 2016-10-02 DIAGNOSIS — E559 Vitamin D deficiency, unspecified: Secondary | ICD-10-CM | POA: Insufficient documentation

## 2016-10-02 DIAGNOSIS — L918 Other hypertrophic disorders of the skin: Secondary | ICD-10-CM | POA: Insufficient documentation

## 2016-10-02 DIAGNOSIS — R0602 Shortness of breath: Secondary | ICD-10-CM | POA: Insufficient documentation

## 2016-10-02 DIAGNOSIS — N898 Other specified noninflammatory disorders of vagina: Secondary | ICD-10-CM | POA: Insufficient documentation

## 2016-10-02 DIAGNOSIS — Z87440 Personal history of urinary (tract) infections: Secondary | ICD-10-CM | POA: Insufficient documentation

## 2016-10-02 DIAGNOSIS — Z23 Encounter for immunization: Secondary | ICD-10-CM | POA: Insufficient documentation

## 2016-10-02 DIAGNOSIS — Z888 Allergy status to other drugs, medicaments and biological substances status: Secondary | ICD-10-CM | POA: Insufficient documentation

## 2016-10-02 DIAGNOSIS — F411 Generalized anxiety disorder: Secondary | ICD-10-CM | POA: Insufficient documentation

## 2016-10-02 LAB — TSH: TSH: 1.44 m[IU]/L

## 2016-10-02 NOTE — BH Specialist Note (Signed)
Session Start time: 2:45 pm   End Time: 3:00 pm Total Time:  15 minutes Type of Service: [place]: No.   Interpreter Name & Language: N/A # [facility] Visits July 2017-June 2018: 1st   SUBJECTIVE: Debra Allen is a 30 y.o. female  Pt. was referred by Weyman Pedro for:  anxiety. Pt. reports the following symptoms/concerns: overwhelming feelings of worry, difficulty sleeping, and panic attacks Duration of problem:  1 month Severity: moderate Previous treatment: Pt reports no prior treatment   OBJECTIVE: Mood: Anxious & Affect: Tearful Risk of harm to self or others: Pt denies SI/HI Assessments administered: PHQ-9; GAD-7  LIFE CONTEXT:  Family & Social: Pt resides with her husband and four children (78, 5, 93, 65 years of age) School/ Work: Pt is employed full time. She receives food stamps Self-Care: Pt reports difficulty sleeping due to racing thoughts at night. No change in appetite. Pt denied substance use Life changes: None reported.  What is important to pt/family (values): Family   GOALS ADDRESSED:  Decrease symptoms of anxiety  INTERVENTIONS: Strength-based, Supportive and Meditation: Mindfulness   ASSESSMENT:  Pt currently experiencing anxiety. Pt reports overwhelming feelings of worry, difficulty sleeping, and panic attacks. Pt has limited support. Pt may benefit from psychoeducation, psychotherapy, and medication management. LCSWA provided psychoeducation regarding anxiety and discussed healthy coping skills (i.e. Breathing and mindfulness techniques) to decrease symptoms of anxiety. Pt identified healthy strategies to apply on a daily basis. LCSWA offered pt supportive resources.      PLAN: 1. F/U with behavioral health clinician: Pt was encouraged to contact [place] if symptoms worsen or fail to improve to schedule behavioral appointments at [facility]. 2. Behavioral Health meds: None reported 3. Behavioral recommendations: LCSWA  recommends that pt apply healthy coping skills discussed. Pt is encouraged to schedule follow up appointment with LCSWA 4. Referral: Brief Counseling/Psychotherapy, Problem-solving teaching/coping strategies, Psychoeducation and Supportive Counseling 5. From scale of 1-10, how likely are you to follow plan: 7/10   Rebekah Chesterfield, MSW, [place] Worker 10/02/16 4:48 pm  Warmhandoff:   Warm Hand Off Completed.

## 2016-10-02 NOTE — Progress Notes (Signed)
Patient is here for HFU: Chest Pain  Patient complains of SOB with exertion and laying down. Patient scales the pain at a 7.5 currently and describes the pain as aching and numbing in left shoulder to fingertips.  Patient has not taken medication today. Patient has eaten today.  Patient would like the flu vaccine today. Patient tolerated flu vaccine well today.

## 2016-10-02 NOTE — Patient Instructions (Signed)
Vitamin D 2000 units daily    Skin Tag, Adult A skin tag (acrochordon) is a soft, extra growth of skin. Most skin tags are flesh-colored and rarely bigger than a pencil eraser. They commonly form near areas where there are folds in the skin, such as the armpit or groin. Skin tags are not dangerous, and they do not spread from person to person (are not contagious). You may have one skin tag or several. Skin tags do not require treatment. However, your health care provider may recommend removal of a skin tag if it:  Gets irritated from clothing.  Bleeds.  Is visible and unsightly. Your health care provider can remove skin tags with a simple surgical procedure or a procedure that involves freezing the skin tag. Follow these instructions at home:  Watch for any changes in your skin tag. A normal skin tag does not require any other special care at home.  Take over-the-counter and prescription medicines only as told by your health care provider.  Keep all follow-up visits as told by your health care provider. This is important. Contact a health care provider if:  You have a skin tag that:  Becomes painful.  Changes color.  Bleeds.  Swells.  You develop more skin tags. This information is not intended to replace advice given to you by your health care provider. Make sure you discuss any questions you have with your health care provider. Document Released: 11/14/2015 Document Revised: 06/25/2016 Document Reviewed: 11/14/2015 Elsevier Interactive Patient Education  2017 [employer].

## 2016-10-02 NOTE — Progress Notes (Signed)
Debra Allen, is a 30 y.o. female  FFM:[id number]  [id number]  DOB - [birth year]  Subjective:  Chief Complaint and HPI: Debra Allen is a 30 y.o. female here today for ED follow up visit. She was seen at the Urgent Care on 08/28/2016 and sent to the ED and was seen there on 08/29/2016.  She was seen and evaluated for CP and SOB.  Cardiac causes were ruled as highly unlikely(normal EKG and enzymes) and no evidence of PE(negative D-Dimer).    She continues to have episodes of CP and SOB where her L arm then R arm and hands go numb.  She begins to breathe hard and feel SOB and dizzy.  She does admit to a lot of stress.  She has 4 children(ages 10, 5, 3, and 2) and is married.  She says the episodes usu coincide with anxiety.  Deep breathing and taking a few minutes to herself usu alleviates the symptoms.    She also c/o skin lesion in the inguinal region that she noticed a few weeks ago.   She c/o fatigue and low libido.  She also c/o on and off vaginal discharge with odor.  This has been happening for about 1 year. This problem comes and goes.  She denies pelvic pain.  No f/c.  No N/V/D.  She has a history of vit D deficiency and has not been taking OTC vit D as directed  ED/Hospital notes/labs/EKG reviewed.     ROS:   Constitutional:  No f/c, No night sweats, No unexplained weight loss. EENT:  No vision changes, No blurry vision, No hearing changes. No mouth, throat, or ear problems.  Respiratory: No cough,  SOB as listed above Cardiac:  CP as listed above, no palpitations GI:  No abd pain, No N/V/D. GU: No Urinary s/sx Musculoskeletal: No joint pain Neuro: No headache, no dizziness, no motor weakness. +paresthesias B hands/arms as listed above. Skin: No rash Endocrine:  No polydipsia. No polyuria.  Psych: Denies SI/HI  No problems updated.  ALLERGIES: Allergies  Allergen Reactions  . Keflex [Cephalexin] Itching and Rash    PAST MEDICAL HISTORY: Past  Medical History:  Diagnosis Date  . Headache(784.0)   . Urinary tract infection     MEDICATIONS AT HOME: Prior to Admission medications   Medication Sig Start Date End Date Taking? Authorizing Provider  acetaminophen (TYLENOL) 325 MG tablet Take 650 mg by mouth every 6 (six) hours as needed.   Yes Historical Provider, MD  cetirizine (ZYRTEC) 10 MG tablet Take 1 tablet (10 mg total) by mouth daily. 05/26/16  Yes Argentina Donovan, PA-C  ferrous sulfate (FERROUSUL) 325 (65 FE) MG tablet Take 1 tablet (325 mg total) by mouth 3 (three) times daily with meals. 01/25/15  Yes Lorayne Marek, MD  levonorgestrel (MIRENA) 20 MCG/24HR IUD 1 each by Intrauterine route once. Lot # [id number], Exp 06/18. Due for removal in 5 years 01/01/2020   Yes Historical Provider, MD  mometasone-formoterol (DULERA) 200-5 MCG/ACT AERO Inhale 2 puffs into the lungs 2 (two) times daily. 03/21/16  Yes Argentina Donovan, PA-C  Multiple Vitamins-Iron (MULTIVITAMINS WITH IRON) TABS tablet Take 1 tablet by mouth daily. 01/25/15  Yes Lorayne Marek, MD  ibuprofen (ADVIL,MOTRIN) 600 MG tablet Take 1 tablet (600 mg total) by mouth every 8 (eight) hours as needed. Patient not taking: Reported on 10/02/2016 03/18/15   Boykin Nearing, MD     Objective:  EXAM:   Vitals:   10/02/16 1412  BP: 99/64  Pulse: 77  Resp: 18  Temp: 98.4 F (36.9 C)  TempSrc: Oral  SpO2: 99%  Weight: 162 lb (73.5 kg)  Height: '5\' 3"'$  (1.6 m)    General appearance : A&OX3. NAD. Non-toxic-appearing. Anxious and worried HEENT: Atraumatic and Normocephalic.  PERRLA. EOM intact.  TM clear B. Mouth-MMM, post pharynx WNL w/o erythema, No PND. Neck: supple, no JVD. No cervical lymphadenopathy. No thyromegaly Chest/Lungs:  Breathing-non-labored, Good air entry bilaterally, breath sounds normal without rales, rhonchi, or wheezing  CVS: S1 S2 regular, no murmurs, gallops, rubs  Abdomen: Bowel sounds present, Non tender and not distended with no gaurding, rigidity or  rebound. GU: deferred today Small, obvious Skin tag present on R labia majora  Extremities: Bilateral Lower Ext shows no edema, both legs are warm to touch with = pulse throughout Neurology:  CN II-XII grossly intact, Non focal.   Psych:  TP linear. J/I WNL. Normal speech. Appropriate eye contact and affect.  Skin:  No Rash  Data Review Lab Results  Component Value Date   HGBA1C 5.20 12/10/2015     Assessment & Plan   1. Anxiety state Anxiety management techniques discussed at length.  She also met with Delana Meyer, the social worker for assessment as well.  Consider SSRI, defer for now - TSH - Vitamin D, 25-hydroxy - Ambulatory referral to Psychology  2. Needs flu shot - Flu Vaccine QUAD 36+ mos PF IM (Fluarix & Fluzone Quad PF)  3. Other chest pain Cardiac/PE R/O.  No risk factors.  S/sx calm with relaxation techniques.  No new concerning s/sxat this time.  4. Cutaneous skin tags Education provided.  These can be removed if she so desires.  5. Vaginal discharge Work-up at next visit.  6. Vitamin D deficiency Take OTC vit D 2000 units daily. - Vitamin D, 25-hydroxy  Spent > 40 mins with patient, obtaining history, counseling, educating, and reassuring.  Social worker also met with patient. Patient have been counseled extensively about nutrition and exercise  Return in about 3 weeks (around 10/23/2016) for with me for f/up anxiety and pelvic exam.  The patient was given clear instructions to go to ER or return to medical center if symptoms don't improve, worsen or new problems develop. The patient verbalized understanding. The patient was told to call to get lab results if they haven't heard anything in the next week.     Freeman Caldron, PA-C [facility] [place], [place]   10/02/2016, 3:00 PMPatient ID: Debra Allen, female   DOB: [birth year], 30 y.o.   MRN: [id number]

## 2016-10-03 ENCOUNTER — Other Ambulatory Visit: Payer: Self-pay | Admitting: Physician Assistant

## 2016-10-03 DIAGNOSIS — E559 Vitamin D deficiency, unspecified: Secondary | ICD-10-CM

## 2016-10-03 LAB — VITAMIN D 25 HYDROXY (VIT D DEFICIENCY, FRACTURES): Vit D, 25-Hydroxy: 17 ng/mL — ABNORMAL LOW (ref 30–100)

## 2016-10-03 MED ORDER — VITAMIN D (ERGOCALCIFEROL) 1.25 MG (50000 UNIT) PO CAPS: 50000.0000 [IU] | ORAL_CAPSULE | ORAL | 0 refills | Status: DC

## 2016-10-13 ENCOUNTER — Telehealth: Payer: Self-pay | Admitting: General Practice

## 2016-10-13 ENCOUNTER — Telehealth: Payer: Self-pay

## 2016-10-13 NOTE — Telephone Encounter (Signed)
Patient returned the nurses phone call

## 2016-10-13 NOTE — Telephone Encounter (Signed)
[place] [id number] contacted pt to go over lab results pt didn't answer lvm asking pt to give me a call at earliest convenience

## 2016-10-25 ENCOUNTER — Ambulatory Visit: Payer: Self-pay | Attending: Family Medicine | Admitting: Family Medicine

## 2016-10-25 ENCOUNTER — Encounter: Payer: Self-pay | Admitting: Family Medicine

## 2016-10-25 VITALS — BP 92/62 | HR 80 | Temp 98.3°F | Resp 18 | Ht 63.0 in | Wt 159.4 lb

## 2016-10-25 DIAGNOSIS — N898 Other specified noninflammatory disorders of vagina: Secondary | ICD-10-CM | POA: Insufficient documentation

## 2016-10-25 DIAGNOSIS — Z79899 Other long term (current) drug therapy: Secondary | ICD-10-CM | POA: Insufficient documentation

## 2016-10-25 DIAGNOSIS — Z114 Encounter for screening for human immunodeficiency virus [HIV]: Secondary | ICD-10-CM | POA: Insufficient documentation

## 2016-10-25 DIAGNOSIS — E559 Vitamin D deficiency, unspecified: Secondary | ICD-10-CM | POA: Insufficient documentation

## 2016-10-25 DIAGNOSIS — Z113 Encounter for screening for infections with a predominantly sexual mode of transmission: Secondary | ICD-10-CM

## 2016-10-25 LAB — POCT URINE PREGNANCY: Preg Test, Ur: NEGATIVE

## 2016-10-25 MED ORDER — VITAMIN D (ERGOCALCIFEROL) 1.25 MG (50000 UNIT) PO CAPS: 50000.0000 [IU] | ORAL_CAPSULE | ORAL | 0 refills | Status: DC

## 2016-10-25 MED ORDER — METRONIDAZOLE 500 MG PO TABS: 500.0000 mg | ORAL_TABLET | Freq: Two times a day (BID) | ORAL | 0 refills | Status: AC

## 2016-10-25 MED FILL — ?METRONIDAZOLE 500 MG TABLE: 500 | 7 days supply | Qty: 14 | Fill #0

## 2016-10-25 MED FILL — VIT D2 1.25 MG (50,000 UNIT: 1.25 MG | 56 days supply | Qty: 8 | Fill #0

## 2016-10-25 NOTE — Progress Notes (Signed)
Patient is here for Pelvic FU  Patient complains of a discharge with a yellow tinge being present. Patient states she has intermittent itching and discomfort.  Patient has not taken medication today. Patient has eaten today.

## 2016-10-25 NOTE — Patient Instructions (Signed)

## 2016-10-26 LAB — CERVICOVAGINAL ANCILLARY ONLY
Chlamydia: NEGATIVE
Neisseria Gonorrhea: NEGATIVE
WET PREP (BD AFFIRM): NEGATIVE

## 2016-10-26 LAB — HEPATITIS C ANTIBODY: HCV AB: NEGATIVE

## 2016-10-26 LAB — HIV ANTIBODY (ROUTINE TESTING W REFLEX): HIV 1&2 Ab, 4th Generation: NONREACTIVE

## 2016-10-26 LAB — HSV(HERPES SIMPLEX VRS) I + II AB-IGG
HSV 1 Glycoprotein G Ab, IgG: 49.6 Index — ABNORMAL HIGH (ref <0.90)
HSV 2 Glycoprotein G Ab, IgG: <0.9 Index (ref <0.90)

## 2016-10-26 LAB — RPR

## 2016-10-26 NOTE — Progress Notes (Signed)
Subjective:  Patient ID: Debra Allen, female    DOB: 1986-02-15  Age: 30 y.o. MRN: [id number]  CC: Gynecologic Exam   HPI Debra Allen presents for complaints of vaginal discharge. She reports discharge began 2 months ago and has persisted. She reports the discharge is thick, yellow-green in color, malodorous, and copious in amount. She reports being sexually active with only 1 partner, her husband within the last 3 months unprotected. She denies any skin lesions or dysuria. She denies taking anything to help relieve symptoms.    Outpatient Medications Prior to Visit  Medication Sig Dispense Refill  . acetaminophen (TYLENOL) 325 MG tablet Take 650 mg by mouth every 6 (six) hours as needed.    . cetirizine (ZYRTEC) 10 MG tablet Take 1 tablet (10 mg total) by mouth daily. 30 tablet 11  . ferrous sulfate (FERROUSUL) 325 (65 FE) MG tablet Take 1 tablet (325 mg total) by mouth 3 (three) times daily with meals. 90 tablet 3  . ibuprofen (ADVIL,MOTRIN) 600 MG tablet Take 1 tablet (600 mg total) by mouth every 8 (eight) hours as needed. 30 tablet 0  . levonorgestrel (MIRENA) 20 MCG/24HR IUD 1 each by Intrauterine route once. Lot # [id number], Exp 06/18. Due for removal in 5 years 01/01/2020    . mometasone-formoterol (DULERA) 200-5 MCG/ACT AERO Inhale 2 puffs into the lungs 2 (two) times daily. 1 Inhaler 0  . Multiple Vitamins-Iron (MULTIVITAMINS WITH IRON) TABS tablet Take 1 tablet by mouth daily. 90 tablet 1  . Vitamin D, Ergocalciferol, (DRISDOL) 50000 units CAPS capsule Take 1 capsule (50,000 Units total) by mouth every 7 (seven) days. (Patient not taking: Reported on 10/25/2016) 15 capsule 0   Facility-Administered Medications Prior to Visit  Medication Dose Route Frequency Provider Last Rate Last Dose  . levonorgestrel (MIRENA) 20 MCG/24HR IUD   Intrauterine Once Boykin Nearing, MD        ROS Review of Systems  Constitutional: Negative.   Respiratory: Negative.     Cardiovascular: Negative.   Gastrointestinal: Negative.   Genitourinary: Positive for vaginal discharge (pt. reports large amount of thick yellow-green malodorus discharge.). Negative for dysuria and genital sores.  Skin: Negative.   Psychiatric/Behavioral: Negative for suicidal ideas. The patient is nervous/anxious.     Objective:  BP 92/62 (BP Location: Right Arm, Patient Position: Sitting, Cuff Size: Normal)   Pulse 80   Temp 98.3 F (36.8 C) (Oral)   Resp 18   Ht 5\' 3"  (1.6 m)   Wt 159 lb 6.4 oz (72.3 kg)   LMP 04/11/2016 (Approximate)   SpO2 99%   BMI 28.24 kg/m   BP/Weight 10/25/2016 10/02/2016 [id number]  Systolic BP 92 99 [id number]  Diastolic BP 62 64 83  Wt. (Lbs) 159.4 162 -  BMI 28.24 28.7 29.47   Physical Exam  Constitutional: She is oriented to person, place, and time. She appears well-developed and well-nourished.  Cardiovascular: Normal rate, regular rhythm, normal heart sounds and intact distal pulses.   Pulmonary/Chest: Effort normal and breath sounds normal.  Abdominal: Soft. Bowel sounds are normal. She exhibits no mass. There is tenderness (suprapubic).  Genitourinary: There is lesion (flesh colored, raised skin lesion to right medial labia) on the right labia. Vaginal discharge (moderate amount of yellow colored discharge; erythema around cervical os. ) found.  Neurological: She is alert and oriented to person, place, and time.  Skin: Skin is warm and dry.  Psychiatric: She has a normal mood and affect. Her behavior is normal. Thought content normal.  Assessment & Plan:   Problem List Items Addressed This Visit    None    Visit Diagnoses    Vaginal discharge    -  Primary   Relevant Orders   POCT urine pregnancy (Completed)   Cervicovaginal ancillary only   Routine screening for STI (sexually transmitted infection)       Relevant Orders   Cervicovaginal ancillary only   HIV antibody (with reflex)   Hepatitis C Antibody   HSV(herpes simplex vrs) 1+2  ab-IgG   RPR   Vitamin D deficiency       Relevant Medications   Vitamin D, Ergocalciferol, (DRISDOL) 50000 units CAPS capsule      Meds ordered this encounter  Medications  . Vitamin D, Ergocalciferol, (DRISDOL) 50000 units CAPS capsule    Sig: Take 1 capsule (50,000 Units total) by mouth every 7 (seven) days.    Dispense:  8 capsule    Refill:  0    Order Specific Question:   Supervising Provider    Answer:   Tresa Garter [id number]  . metroNIDAZOLE (FLAGYL) 500 MG tablet    Sig: Take 1 tablet (500 mg total) by mouth 2 (two) times daily. Do not drink alcohol while taking this medication.    Dispense:  14 tablet    Refill:  0    Order Specific Question:   Supervising Provider    Answer:   Tresa Garter [id number]    Follow-up: Return for As needed.   Alfonse Spruce FNP

## 2016-11-10 NOTE — Progress Notes (Signed)
Please inform patient of HIV, SYPHILIS, HEP C AND STD'S were negative. 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 Patient should not kiss, share utensils or engage in oral sex when a cold sore is present.

## 2016-11-10 NOTE — Telephone Encounter (Signed)
Patient Verify DOB  Patient was inform and aware that HIV, Syphilis, HEP C, and STD's were negative.  Patient also was aware of  a herpes simplex pos and recommended not to kiss, share utensils or engage in oral sex when cold sore present.

## 2016-11-10 NOTE — Telephone Encounter (Signed)
-----   Message from Trecia Rogers, [place] sent at 11/10/2016 11:48 AM EST ----- Please inform patient of HIV, SYPHILIS, HEP C AND STD'S were negative. 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 Patient should not kiss, share utensils or engage in oral sex when a cold sore is present.

## 2016-11-15 NOTE — Congregational Nurse Program (Signed)
Congregational Nurse Program Note  Date of Encounter: 10/26/2016  Past Medical History: Past Medical History:  Diagnosis Date  . Headache(784.0)   . Urinary tract infection     Encounter Details:     CNP Questionnaire - 10/26/16 2331      Patient Demographics   Is this a new or existing patient? Existing   Patient is considered a/an Immigrant   Race Latino/Hispanic     Patient Assistance   Location of Patient [place]   Patient's financial/insurance status [facility] Oncologist;Low Income   Uninsured Patient ([facility]ange Oncologist) No   Patient referred to apply for the following financial assistance Not Applicable   Food insecurities addressed Not Applicable   Transportation assistance No   Assistance securing medications No   Educational Programmer, multimedia the healthcare system;Nutrition     Encounter Details   Primary purpose of visit Education/Health Concerns   Was an Emergency Department visit averted? No   Does patient have a medical provider? Yes   Patient referred to Not Applicable   Was a mental health screening completed? (GAINS tool) No   Does patient have dental issues? No   Does patient have vision issues? No   Does your patient have an abnormal blood pressure today? No   Since previous encounter, have you referred patient for abnormal blood pressure that resulted in a new diagnosis or medication change? No   Does your patient have an abnormal blood glucose today? No   Since previous encounter, have you referred patient for abnormal blood glucose that resulted in a new diagnosis or medication change? No   Was there a life-saving intervention made? No         Amb Nursing Assessment - 10/25/16 1507      Pre-visit preparation   Pre-visit preparation completed Yes     Pain Assessment   Pain Assessment No/denies pain     Nutrition Screen   Diabetes No     Functional Status   Activities of Daily Living  Independent   Ambulation Independent   Medication Administration Independent   Home Management Independent     Abuse/Neglect Assessment   Do you feel unsafe in your current relationship? No   Do you feel physically threatened by others? No   Anyone hurting you at home, work, or school? No   Unable to ask? No     Met with Jesus at her job at [employer].  She has been working, but asked if I was able to find anyone who could help her with Christmas for her children.  Did not apply for any help because she thought she could do alone, but does not have money for Christmas.  They have had illness and had to help inlaws.  Donor found to give money for Christmas gifts.  Client very appreciative.  Conference with Gilmer Mor, donation possible from a resource she may have

## 2016-11-18 NOTE — Congregational Nurse Program (Signed)
Congregational Nurse Program Note  Date of Encounter: 11/02/2016  Past Medical History: Past Medical History:  Diagnosis Date  . Headache(784.0)   . Urinary tract infection     Encounter Details:     CNP Questionnaire - 11/02/16 0051      Patient Demographics   Is this a new or existing patient? Existing   Patient is considered a/an Immigrant   Race Latino/Hispanic     Patient Assistance   Location of Patient [place]   Patient's financial/insurance status Orange Oncologist;Low Income   Uninsured Patient (Orange Oncologist) No   Patient referred to apply for the following financial assistance Not Applicable   Food insecurities addressed Not Applicable   Transportation assistance No   Assistance securing medications No   Educational Programmer, multimedia the healthcare system;Nutrition     Encounter Details   Primary purpose of visit Education/Health Concerns;Acute Illness/Condition Visit   Was an Emergency Department visit averted? No   Does patient have a medical provider? Yes   Patient referred to Follow up with established PCP   Was a mental health screening completed? (GAINS tool) No   Does patient have dental issues? No   Does patient have vision issues? No   Does your patient have an abnormal blood pressure today? No   Since previous encounter, have you referred patient for abnormal blood pressure that resulted in a new diagnosis or medication change? No   Does your patient have an abnormal blood glucose today? No   Since previous encounter, have you referred patient for abnormal blood glucose that resulted in a new diagnosis or medication change? No   Was there a life-saving intervention made? No         Amb Nursing Assessment - 10/25/16 1507      Pre-visit preparation   Pre-visit preparation completed Yes     Pain Assessment   Pain Assessment No/denies pain     Nutrition Screen   Diabetes No     Functional Status   Activities of Daily Living Independent   Ambulation Independent   Medication Administration Independent   Home Management Independent     Abuse/Neglect Assessment   Do you feel unsafe in your current relationship? No   Do you feel physically threatened by others? No   Anyone hurting you at home, work, or school? No   Unable to ask? No      Picked up Christmas gifts for children from [facility].  HV made to deliver Christmas gifts picked up.  Client reports appreciation for help for kids.  Her middle child had been sick and she picked up meds for her today.  Debra Allen went to ER and had som problems with her arm tingling.  Tests were done and report was she had a small clot in her arm, but no meds for that given, Told that she would be ok and it would resolve.  She was given medi for anxiety but she is not taking because she is afraid it would be addictive.  I asked her to come to see me to followup with her medical issues.  Client thinks she might come back to GED as she wants to complete her GED.

## 2017-10-01 ENCOUNTER — Ambulatory Visit: Payer: Self-pay | Admitting: Family Medicine

## 2017-10-02 ENCOUNTER — Other Ambulatory Visit: Payer: Self-pay

## 2017-10-02 ENCOUNTER — Encounter: Payer: Self-pay | Admitting: Family Medicine

## 2017-10-02 ENCOUNTER — Ambulatory Visit: Payer: Self-pay | Attending: Family Medicine | Admitting: Family Medicine

## 2017-10-02 VITALS — BP 103/68 | HR 80 | Temp 97.8°F | Resp 16 | Wt 170.0 lb

## 2017-10-02 DIAGNOSIS — Z113 Encounter for screening for infections with a predominantly sexual mode of transmission: Secondary | ICD-10-CM | POA: Insufficient documentation

## 2017-10-02 DIAGNOSIS — Z833 Family history of diabetes mellitus: Secondary | ICD-10-CM | POA: Insufficient documentation

## 2017-10-02 DIAGNOSIS — K30 Functional dyspepsia: Secondary | ICD-10-CM | POA: Insufficient documentation

## 2017-10-02 DIAGNOSIS — Z01419 Encounter for gynecological examination (general) (routine) without abnormal findings: Secondary | ICD-10-CM | POA: Insufficient documentation

## 2017-10-02 DIAGNOSIS — Z881 Allergy status to other antibiotic agents status: Secondary | ICD-10-CM | POA: Insufficient documentation

## 2017-10-02 DIAGNOSIS — Z8719 Personal history of other diseases of the digestive system: Secondary | ICD-10-CM

## 2017-10-02 DIAGNOSIS — R3129 Other microscopic hematuria: Secondary | ICD-10-CM | POA: Insufficient documentation

## 2017-10-02 DIAGNOSIS — Z8249 Family history of ischemic heart disease and other diseases of the circulatory system: Secondary | ICD-10-CM | POA: Insufficient documentation

## 2017-10-02 DIAGNOSIS — Z800 Family history of malignant neoplasm of digestive organs: Secondary | ICD-10-CM | POA: Insufficient documentation

## 2017-10-02 DIAGNOSIS — R1030 Lower abdominal pain, unspecified: Secondary | ICD-10-CM | POA: Insufficient documentation

## 2017-10-02 DIAGNOSIS — Z8669 Personal history of other diseases of the nervous system and sense organs: Secondary | ICD-10-CM

## 2017-10-02 DIAGNOSIS — H547 Unspecified visual loss: Secondary | ICD-10-CM | POA: Insufficient documentation

## 2017-10-02 DIAGNOSIS — Z0000 Encounter for general adult medical examination without abnormal findings: Secondary | ICD-10-CM

## 2017-10-02 DIAGNOSIS — Z23 Encounter for immunization: Secondary | ICD-10-CM | POA: Insufficient documentation

## 2017-10-02 LAB — HEMOCCULT GUIAC POC 1CARD (OFFICE): FECAL OCCULT BLD: NEGATIVE

## 2017-10-02 LAB — POCT URINE PREGNANCY: Preg Test, Ur: NEGATIVE

## 2017-10-02 LAB — POCT URINALYSIS DIPSTICK
Bilirubin, UA: NEGATIVE
CLARITY UA: NEGATIVE
GLUCOSE UA: NEGATIVE
Leukocytes, UA: NEGATIVE
Nitrite, UA: NEGATIVE
PROTEIN UA: NEGATIVE
SPEC GRAV UA: 1.025 (ref 1.010–1.025)
UROBILINOGEN UA: 0.2 U/dL
pH, UA: 6 (ref 5.0–8.0)

## 2017-10-02 LAB — POCT GLYCOSYLATED HEMOGLOBIN (HGB A1C): HEMOGLOBIN A1C: 5.4

## 2017-10-02 MED ORDER — OMEPRAZOLE 20 MG PO CPDR: 20.0000 mg | DELAYED_RELEASE_CAPSULE | Freq: Every day | ORAL | 2 refills | Status: DC

## 2017-10-02 MED FILL — ?OMEPRAZOLE DR 20 MG CAPSUL: 20 | 30 days supply | Qty: 30 | Fill #0

## 2017-10-02 NOTE — Patient Instructions (Signed)
Prueba de Papanicolaou (Pap Test) POR QU ME DEBO REALIZAR ESTA PRUEBA? A esta prueba tambin se la denomina "frotis de Pap". Es una prueba de deteccin que se [place] para Hydrographic surveyor signos de cncer de vagina, cuello del tero y tero. La prueba tambin puede identificar la presencia de infeccin o cambios precancerosos. El mdico probablemente le recomiende que se realice esta prueba en forma regular. Esta prueba puede realizarse de la siguiente manera:  Cada 3 aos, a partir de los 21 aos.  Cada 5 aos, en combinacin con las pruebas que se realizan para Product manager presencia del virus del Engineer, technical sales (VPH).  Con mayor o menor frecuencia, en funcin de otras enfermedades que tenga. QU TIPO DE MUESTRA SE TOMA? El mdico utilizar un pequeo hisopo de algodn, una esptula de plstico o un cepillo para Field seismologist una muestra de clulas de la superficie del cuello del tero. El cuello del tero es la apertura del tero, que tambin se conoce como matriz. Tambin pueden recolectarse las secreciones del cuello del tero y la vagina. [place]?  Tenga en cuenta en qu etapa del ciclo menstrual se encuentra. Es posible que Estate agent la prueba si est Forensic psychologist en que debe realizrsela.  Si el da en que debe realizarse la prueba tiene una infeccin vaginal aparente, deber reprogramar la prueba.  Pueden pedirle que evite tomar una ducha o bao el da de la prueba o el da anterior.  Algunos medicamentos pueden [employer] de la prueba, como los digitlicos y Lexicographer. Si toma alguno de [employer], hable con su mdico antes de [employer] prueba. [place]? Los [employer] de la prueba pueden indicar diversas enfermedades. Estas pueden incluir lo siguiente:  Cncer. Si bien los resultados de la prueba de Papanicolaou no pueden utilizarse para Community education officer de cuello del tero,  de vagina o de tero, pueden indicar que existe una posibilidad de presencia de cncer. En [place], ser necesario realizar pruebas adicionales para determinar la presencia de cncer.  Enfermedad de transmisin sexual.  Infecciones por hongos.  Infeccin por parsitos.  Infeccin por herpes.  Una enfermedad que causa o favorece la infertilidad. Es su responsabilidad retirar el resultado del [place]. Consulte en el laboratorio o en el departamento en el que fue realizado el estudio cundo y cmo podr [employer]. Comunquese con el mdico si tiene [employer]. Esta informacin no tiene Marine scientist el consejo del mdico. Asegrese de hacerle al mdico cualquier pregunta que tenga. Document Released: 04/17/2008 Document Revised: 11/20/2014 Document Reviewed: 03/23/2014 Elsevier Interactive Patient Education  2018 [place] Glass blower/designer (Breast Self-Awareness) El autoexamen de [place] significa lo siguiente:  Saber cul es el aspecto de las [place].  Saber cmo se las siente al tacto.  Controlarse las [employer] para Transport planner.  Informar el mdico si advierte un cambio en las mamas. El autoexamen de mama le permite advertir problemas en la mama con anticipacin, cuando todava son pequeos. [place] forma de aprender qu es normal para las mamas y revisar si hay cambios es hacerse un autoexamen de las [place]. Para hacer un autoexamen de las mamas: Busque cambios 1. Qutese toda la ropa por encima de la cintura. 2. Prese frente a un espejo en una habitacin con buena iluminacin. 3. Apoye las manos en las caderas. 4. Empuje hacia abajo con las manos. [place]  mamas y los pezones en el espejo, para ver si hay diferencias entre s. Durante el examen, intente determinar si:  La forma de una mama es diferente.  El tamao de una mama es diferente.  Hay arrugas, depresiones y  protuberancias en [place] y no en la otra. 1. Observe cada mama para buscar cambios en la piel, por ejemplo:  Enrojecimiento.  Zonas escamosas. 1. Observe si hay cambios en los pezones, por ejemplo:  Lquido alrededor [employer].  Hemorragia.  Hoyuelos.  Enrojecimiento.  Un cambio en el lugar de los pezones. Plpese para detectar si hay cambios 1. Acustese en el piso boca arriba. 2. Plpese cada mama. Para hacerlo, siga estos pasos:  Elija una mama para palpar.  Coloque el brazo ms cercano a esa mama por encima de la cabeza.  Use el otro brazo para palpar la zona del pezn de la mama. Plpese la zona con las yemas de los tres dedos del medio y haga crculos con los dedos. Con el primer crculo, presione suavemente. Con el segundo, ms fuerte. Con el tercero, an ms fuerte.  Siga haciendo crculos con los dedos con la presin [place], fuerte e incluso ms fuerte a medida que desciende por la mama. Detngase cuando sienta las costillas.  Desplace los dedos un poco hacia el centro del cuerpo.  Empiece a hacer crculos con los dedos nuevamente y esta vez haga movimientos ascendentes hasta llegar a la clavcula.  Siga haciendo crculos [place] llegar a la axila. Recuerde hacerlos con las tres presiones.  Plpese la otra mama de la misma forma. 1. Sintese o prese en la ducha o la baera. 2. Con agua jabonosa en la piel, plpese cada mama del mismo modo que lo hizo en el paso2, mientras estaba acostada en el piso. Anote sus hallazgos Despus del autoexamen, anote lo siguiente:  Qu es normal para cada mama.  Cualquier cambio que haya encontrado en cada mama.  Cundo tuvo la ltima [place]. CON QU FRECUENCIA DEBO EXAMINARME LAS MAMAS? Examnese las [employer]. Si est amamantando, el mejor momento para el examen es despus de darle de mamar al beb o despus de usar un sacaleches. Si menstra, el mejor momento para [place] es  5 a 7das despus de la finalizada la [place]. [place]? Visite al mdico si advierte lo siguiente:  Un cambio en la forma o el tamao de las mamas o los pezones.  Un cambio en la piel de las mamas o los pezones, como la piel enrojecida o escamosa.  Secrecin de un lquido fuera de lo comn de los pezones.  Un ndulo o una zona engrosada que no tena antes.  Dolor de [place].  Cualquier cosa que la preocupe. Esta informacin no tiene Marine scientist el consejo del mdico. Asegrese de hacerle al mdico cualquier pregunta que tenga. Document Released: 12/02/2010 Document Revised: 02/21/2016 Document Reviewed: 09/19/2015 Elsevier Interactive Patient Education  [employer].

## 2017-10-03 LAB — CMP AND LIVER
ALT: 40 IU/L — ABNORMAL HIGH (ref 0–32)
AST: 20 IU/L (ref 0–40)
Albumin: 4.7 g/dL (ref 3.5–5.5)
Alkaline Phosphatase: 67 IU/L (ref 39–117)
BILIRUBIN TOTAL: 0.7 mg/dL (ref 0.0–1.2)
BUN: 12 mg/dL (ref 6–20)
Bilirubin, Direct: 0.19 mg/dL (ref 0.00–0.40)
CALCIUM: 9.7 mg/dL (ref 8.7–10.2)
CHLORIDE: 103 mmol/L (ref 96–106)
CO2: 25 mmol/L (ref 20–29)
CREATININE: 0.53 mg/dL — AB (ref 0.57–1.00)
GFR calc Af Amer: 148 mL/min/{1.73_m2} (ref >59)
GFR, EST NON AFRICAN AMERICAN: 129 mL/min/{1.73_m2} (ref >59)
Glucose: 77 mg/dL (ref 65–99)
Potassium: 4.2 mmol/L (ref 3.5–5.2)
SODIUM: 143 mmol/L (ref 134–144)
TOTAL PROTEIN: 7.5 g/dL (ref 6.0–8.5)

## 2017-10-03 LAB — H. PYLORI BREATH TEST: H PYLORI BREATH TEST: NEGATIVE

## 2017-10-03 LAB — LIPID PANEL
CHOL/HDL RATIO: 2.9 ratio (ref 0.0–4.4)
Cholesterol, Total: 168 mg/dL (ref 100–199)
HDL: 57 mg/dL (ref >39)
LDL CALC: 100 mg/dL — AB (ref 0–99)
TRIGLYCERIDES: 54 mg/dL (ref 0–149)
VLDL Cholesterol Cal: 11 mg/dL (ref 5–40)

## 2017-10-03 LAB — HSV(HERPES SIMPLEX VRS) I + II AB-IGG
HSV 1 Glycoprotein G Ab, IgG: 50.5 index — ABNORMAL HIGH (ref 0.00–0.90)
HSV 2 IgG, Type Spec: <0.91 index (ref 0.00–0.90)

## 2017-10-03 LAB — HEP, RPR, HIV PANEL
HEP B S AG: NEGATIVE
HIV SCREEN 4TH GENERATION: NONREACTIVE
RPR: NONREACTIVE

## 2017-10-03 LAB — TSH: TSH: 1.38 u[IU]/mL (ref 0.450–4.500)

## 2017-10-05 LAB — CYTOLOGY - PAP
Bacterial vaginitis: NEGATIVE
CHLAMYDIA, DNA PROBE: NEGATIVE
Candida vaginitis: NEGATIVE
Diagnosis: NEGATIVE
HPV (WINDOPATH): NOT DETECTED
Neisseria Gonorrhea: NEGATIVE
TRICH (WINDOWPATH): NEGATIVE

## 2017-10-07 NOTE — Progress Notes (Signed)
Subjective:   Patient ID: Debra Allen, female    DOB: 1986-09-01, 31 y.o.   MRN: [id number]  Chief Complaint  Patient presents with  . Annual Exam  . Gynecologic Exam   HPI Debra Allen 31 y.o. female presents comprehensive physical exam and Pap smear.  IUD issues: Onset 1 month ago.  Symptoms include cramping to the suprapubic area and bloating.  She denies any dysuria, vaginal lesions, or vaginal discharge.  Reports receiving Mirena IUD less than 5 years ago.  She denies any family history of gynecological cancers or breast cancers.  She denies performing monthly SBE.  She is a non-smoker.  She denies any breast lumps, tenting or dimpling of the breast, or nipple discharge.  Lower abdominal/epigastric pain: Onset 1 month ago.  Symptoms include nausea, bloating, indigestion.  She does report history of blood in stool last Wednesday.  She does report history of anemia.  She reports history of constipation reports having bowel movement every other day.  She does not eat a high fiber diet she does report adequate water intake.  Family history of hypertension-mother, father; diabetes-mother, father; cancer-paternal grandfather colon cancer, liver cancer- maternal grandmother.  Headaches: Onset 2 years ago.  Location bilateral aching.  Symptoms aggravated by light, sounds.  She reports taking over-the-counter Tylenol for relief of adequate symptoms.  Past Medical History:  Diagnosis Date  . Headache(784.0)   . Urinary tract infection     Past Surgical History:  Procedure Laterality Date  . NO PAST SURGERIES      Family History  Problem Relation Age of Onset  . Diabetes Mother   . Hypertension Maternal Grandmother   . Diabetes Maternal Grandmother   . Cancer Paternal Grandmother   . Cancer Paternal Grandfather   . Hypertension Father   . Cancer Paternal Aunt        bladder cancer   . Asthma Other   . Obesity Other   . Sleep apnea Other   . Heart disease Other   .  Other Neg Hx     Social History   Socioeconomic History  . Marital status: Single    Spouse name: Not on file  . Number of children: Not on file  . Years of education: Not on file  . Highest education level: Not on file  Social Needs  . Financial resource strain: Not on file  . Food insecurity - worry: Not on file  . Food insecurity - inability: Not on file  . Transportation needs - medical: Not on file  . Transportation needs - non-medical: Not on file  Occupational History  . Not on file  Tobacco Use  . Smoking status: Never Smoker  . Smokeless tobacco: Never Used  Substance and Sexual Activity  . Alcohol use: No  . Drug use: No  . Sexual activity: Yes    Birth control/protection: None  Other Topics Concern  . Not on file  Social History Narrative  . Not on file    Outpatient Medications Prior to Visit  Medication Sig Dispense Refill  . acetaminophen (TYLENOL) 325 MG tablet Take 650 mg by mouth every 6 (six) hours as needed.    . cetirizine (ZYRTEC) 10 MG tablet Take 1 tablet (10 mg total) by mouth daily. 30 tablet 11  . ibuprofen (ADVIL,MOTRIN) 600 MG tablet Take 1 tablet (600 mg total) by mouth every 8 (eight) hours as needed. 30 tablet 0  . levonorgestrel (MIRENA) 20 MCG/24HR IUD 1 each by Intrauterine route once. Lot #  [id number], Exp 06/18. Due for removal in 5 years 01/01/2020    . Multiple Vitamins-Iron (MULTIVITAMINS WITH IRON) TABS tablet Take 1 tablet by mouth daily. 90 tablet 1  . Vitamin D, Ergocalciferol, (DRISDOL) 50000 units CAPS capsule Take 1 capsule (50,000 Units total) by mouth every 7 (seven) days. 8 capsule 0  . ferrous sulfate (FERROUSUL) 325 (65 FE) MG tablet Take 1 tablet (325 mg total) by mouth 3 (three) times daily with meals. (Patient not taking: Reported on 10/02/2017) 90 tablet 3  . mometasone-formoterol (DULERA) 200-5 MCG/ACT AERO Inhale 2 puffs into the lungs 2 (two) times daily. (Patient not taking: Reported on 10/02/2017) 1 Inhaler 0    Facility-Administered Medications Prior to Visit  Medication Dose Route Frequency Provider Last Rate Last Dose  . levonorgestrel (MIRENA) 20 MCG/24HR IUD   Intrauterine Once Boykin Nearing, MD        Allergies  Allergen Reactions  . Keflex [Cephalexin] Itching and Rash    Review of Systems  Constitutional: Negative.   HENT: Negative.   Eyes: Negative.        Wears corrective lenses  Respiratory: Negative.   Cardiovascular: Negative.   Gastrointestinal: Positive for abdominal pain, blood in stool (history) and constipation.       Indigestion  Genitourinary: Negative.   Musculoskeletal: Negative.   Skin: Negative.   Neurological: Negative.   Endo/Heme/Allergies: Negative.   Psychiatric/Behavioral: Negative.       Objective:    Physical Exam  Constitutional: She is oriented to person, place, and time. She appears well-developed and well-nourished.  HENT:  Head: Normocephalic and atraumatic.  Right Ear: External ear normal.  Left Ear: External ear normal.  Nose: Nose normal.  Mouth/Throat: Oropharynx is clear and moist.  Eyes: Conjunctivae and EOM are normal. Pupils are equal, round, and reactive to light.  Neck: Normal range of motion. Neck supple. No thyromegaly present.  Cardiovascular: Normal rate, regular rhythm, normal heart sounds and intact distal pulses.  Pulmonary/Chest: Effort normal and breath sounds normal. She has no wheezes. Right breast exhibits no mass, no nipple discharge, no skin change and no tenderness. Left breast exhibits no mass, no nipple discharge, no skin change and no tenderness.  Abdominal: Soft. Bowel sounds are normal. There is tenderness.  Genitourinary: Vagina normal and uterus normal. Rectal exam shows guaiac negative stool. Cervix exhibits no discharge. No vaginal discharge found.  Genitourinary Comments: IUD in place, 2 strings visualized.  Musculoskeletal: Normal range of motion.  Lymphadenopathy:    She has no cervical adenopathy.   Neurological: She is alert and oriented to person, place, and time. She has normal reflexes.  Skin: Skin is warm and dry.  Psychiatric: She expresses no homicidal and no suicidal ideation. She expresses no suicidal plans and no homicidal plans.  Nursing note and vitals reviewed.   BP 103/68 (BP Location: Left Arm, Patient Position: Sitting, Cuff Size: Large)   Pulse 80   Temp 97.8 F (36.6 C) (Oral)   Resp 16   Wt 170 lb (77.1 kg)   SpO2 98%   BMI 30.11 kg/m  Wt Readings from Last 3 Encounters:  10/02/17 170 lb (77.1 kg)  10/25/16 159 lb 6.4 oz (72.3 kg)  10/02/16 162 lb (73.5 kg)    Immunization History  Administered Date(s) Administered  . Influenza Split 09/07/2011, 12/25/2012, 09/23/2015  . Influenza,inj,Quad PF,6+ Mos 07/29/2014, 10/02/2016, 10/02/2017  . Tdap 06/11/2013, 07/15/2014    Diabetic Foot Exam - Simple   No data filed  Lab Results  Component Value Date   TSH 1.380 10/02/2017   Lab Results  Component Value Date   WBC 6.7 08/28/2016   HGB 12.9 08/28/2016   HCT 37.6 08/28/2016   MCV 86.2 08/28/2016   PLT 214 08/28/2016   Lab Results  Component Value Date   NA 143 10/02/2017   K 4.2 10/02/2017   CO2 25 10/02/2017   GLUCOSE 77 10/02/2017   BUN 12 10/02/2017   CREATININE 0.53 (L) 10/02/2017   BILITOT 0.7 10/02/2017   ALKPHOS 67 10/02/2017   AST 20 10/02/2017   ALT 40 (H) 10/02/2017   PROT 7.5 10/02/2017   ALBUMIN 4.7 10/02/2017   CALCIUM 9.7 10/02/2017   ANIONGAP 7 08/28/2016   Lab Results  Component Value Date   CHOL 168 10/02/2017   CHOL 160 10/07/2013   Lab Results  Component Value Date   HDL 57 10/02/2017   HDL 47 10/07/2013   Lab Results  Component Value Date   LDLCALC 100 (H) 10/02/2017   LDLCALC 101 (H) 10/07/2013   Lab Results  Component Value Date   TRIG 54 10/02/2017   TRIG 59 10/07/2013   Lab Results  Component Value Date   CHOLHDL 2.9 10/02/2017   CHOLHDL 3.4 10/07/2013   Lab Results  Component Value  Date   HGBA1C 5.4 10/02/2017   HGBA1C 5.20 12/10/2015       Assessment & Plan:   1. Annual physical exam  - CMP and Liver - Lipid Panel - POCT glycosylated hemoglobin (Hb A1C) - TSH - CBC; Future  2. Well woman exam with routine gynecological exam  - Cytology - PAP [place]  3. History of visual impairment  - Ambulatory referral to Ophthalmology  4. Indigestion  - H. pylori breath test - omeprazole (PRILOSEC) 20 MG capsule; Take 1 capsule (20 mg total) by mouth daily.  Dispense: 30 capsule; Refill: 2  5. Screening for STDs (sexually transmitted diseases)  - Cytology - PAP [place] - HEP, RPR, HIV Panel - HSV(herpes simplex vrs) 1+2 ab-IgG  6. History of bloody stools (-) Hemoccult - Hemoccult - 1 Card (office)  7. Family history of colon cancer  - Hemoccult - 1 Card (office)  8. Lower abdominal pain  - Urinalysis Dipstick - POCT urine pregnancy - DG Abd 1 View; Future - CBC; Future  9. Microscopic hematuria  - Urinalysis Dipstick - CBC; Future  10. Need for influenza vaccination  - Flu Vaccine QUAD 6+ mos PF IM (Fluarix Quad PF)     Follow up: Return in about 1 year (around 10/02/2018), or if symptoms worsen or fail to improve, for Annual physical.    Fredia Beets, FNP

## 2017-10-08 LAB — CERVICOVAGINAL ANCILLARY ONLY: HERPES (WINDOWPATH): NEGATIVE

## 2017-10-15 ENCOUNTER — Telehealth: Payer: Self-pay

## 2017-10-15 NOTE — Telephone Encounter (Signed)
-----   Message from Alfonse Spruce, [place] sent at 10/12/2017  1:05 PM EST ----- Pap smear showed no lesions or malignancy. HIV, Hepatitis B, and syphilis are all negative. Genital Herpes, Gonorrhea, Chlamydia,BV, Yeast, and Trichomonas were all negative. Herpes type 1 which is primarily responsible for cold sores is positive. When you have cold sores do not kiss anyone, share utensils, or have oral sex. Labs that evaluated your blood cells, fluid and electrolyte balance are normal. No signs of anemia, infection, or inflammation present. H.pylori is negative. H.pylori is a bacteria that can infect the stomach and cause stomach ulcers. Cholesterol levels normal. Recommend walk-in lab visit to obtain CBC.

## 2017-10-15 NOTE — Telephone Encounter (Signed)
CMA call regarding lab results   Patient Verify DOB   Patient was aware and understood  

## 2018-01-17 ENCOUNTER — Ambulatory Visit: Payer: Self-pay | Attending: Internal Medicine | Admitting: Physician Assistant

## 2018-01-17 VITALS — BP 106/69 | HR 80 | Temp 98.2°F | Ht 63.0 in | Wt 170.6 lb

## 2018-01-17 DIAGNOSIS — R102 Pelvic and perineal pain: Secondary | ICD-10-CM

## 2018-01-17 DIAGNOSIS — N3000 Acute cystitis without hematuria: Secondary | ICD-10-CM

## 2018-01-17 DIAGNOSIS — Z30430 Encounter for insertion of intrauterine contraceptive device: Secondary | ICD-10-CM

## 2018-01-17 DIAGNOSIS — T148XXA Other injury of unspecified body region, initial encounter: Secondary | ICD-10-CM

## 2018-01-17 LAB — POCT URINALYSIS DIPSTICK
Bilirubin, UA: NEGATIVE
Glucose, UA: NEGATIVE
Ketones, UA: NEGATIVE
NITRITE UA: NEGATIVE
PROTEIN UA: NEGATIVE
SPEC GRAV UA: 1.015 (ref 1.010–1.025)
Urobilinogen, UA: 0.2 E.U./dL
pH, UA: 5.5 (ref 5.0–8.0)

## 2018-01-17 LAB — POCT URINE PREGNANCY: PREG TEST UR: NEGATIVE

## 2018-01-17 MED ORDER — NITROFURANTOIN MONOHYD MACRO 100 MG PO CAPS: 100.0000 mg | ORAL_CAPSULE | Freq: Two times a day (BID) | ORAL | 0 refills | Status: DC

## 2018-01-17 MED ORDER — METHOCARBAMOL 500 MG PO TABS: 500.0000 mg | ORAL_TABLET | Freq: Three times a day (TID) | ORAL | 0 refills | Status: DC

## 2018-01-17 MED ORDER — IBUPROFEN 600 MG PO TABS: 600.0000 mg | ORAL_TABLET | Freq: Three times a day (TID) | ORAL | 0 refills | Status: DC

## 2018-01-17 MED FILL — IBUPROFEN 600 MG TABLET: 600 | 20 days supply | Qty: 60 | Fill #0

## 2018-01-17 MED FILL — METHOCARBAMOL 500 MG TABLET: 500 | 30 days supply | Qty: 90 | Fill #0

## 2018-01-17 MED FILL — NITROFURANTOIN MONO-MCR 100: 100 | 3 days supply | Qty: 6 | Fill #0

## 2018-01-17 NOTE — Progress Notes (Signed)
Pt's is here for lower abdominal pain with vaginal discharges, odor, and itching. Started 3 weeks ago.  Pt's also stated she have burning pain under her left armpits and radiate to her arms.

## 2018-01-17 NOTE — Progress Notes (Signed)
Patient ID: Debra Allen, female   DOB: [birth year], 32 y.o.   MRN: [id number]     Debra Allen, is a 32 y.o. female  HAL:[id number]  BDZ:[id number]  DOB - December 28, 1985  Subjective:  Chief Complaint and HPI: Debra Allen is a 32 y.o. female here today with some pelvic pain.  3 weeks ago had "regular cramping".  Has IUD in place X 3 years.  Cramping is getting worse in pelvic region. .  Some vaginal discharge in panties.  No f/c. Frequent nausea in general.  Some burning with urination.  Had stomach virus for a few days about 1 week ago which has improved.    Pain under L arm/L chest and breast area, burning, and itching X 1 week.  No rash.  No breast lump.  NKI.  Pain is worse with movement.    Social: married   ROS:   Constitutional:  No f/c, No night sweats, No unexplained weight loss. EENT:  No vision changes, No blurry vision, No hearing changes. No mouth, throat, or ear problems.  Respiratory: No cough, No SOB Cardiac: No CP, no palpitations GI:  See above GU: + Urinary s/sx Musculoskeletal: L chest pain Neuro: No headache, no dizziness, no motor weakness.  Skin: No rash Endocrine:  No polydipsia. No polyuria.  Psych: Denies SI/HI  No problems updated.  ALLERGIES: Allergies  Allergen Reactions  . Keflex [Cephalexin] Itching and Rash    PAST MEDICAL HISTORY: Past Medical History:  Diagnosis Date  . Headache(784.0)   . Urinary tract infection     MEDICATIONS AT HOME: Prior to Admission medications   Medication Sig Start Date End Date Taking? Authorizing Provider  levonorgestrel (MIRENA) 20 MCG/24HR IUD 1 each by Intrauterine route once. Lot # [id number], Exp 06/18. Due for removal in 5 years 01/01/2020   Yes [provider]  acetaminophen (TYLENOL) 325 MG tablet Take 650 mg by mouth every 6 (six) hours as needed.    [provider]  cetirizine (ZYRTEC) 10 MG tablet Take 1 tablet (10 mg total) by mouth daily. Patient not  taking: Reported on 01/17/2018 05/26/16   Argentina Donovan, PA-C  ferrous sulfate (FERROUSUL) 325 (65 FE) MG tablet Take 1 tablet (325 mg total) by mouth 3 (three) times daily with meals. Patient not taking: Reported on 10/02/2017 01/25/15   Lorayne Marek, MD  ibuprofen (ADVIL,MOTRIN) 600 MG tablet Take 1 tablet (600 mg total) by mouth 3 (three) times daily. X 7 days then prn pain 01/17/18   Argentina Donovan, PA-C  methocarbamol (ROBAXIN) 500 MG tablet Take 1 tablet (500 mg total) by mouth 3 (three) times daily. X 7 days then prn muscle spasm 01/17/18   Argentina Donovan, PA-C  mometasone-formoterol (DULERA) 200-5 MCG/ACT AERO Inhale 2 puffs into the lungs 2 (two) times daily. Patient not taking: Reported on 10/02/2017 03/21/16   Argentina Donovan, PA-C  Multiple Vitamins-Iron (MULTIVITAMINS WITH IRON) TABS tablet Take 1 tablet by mouth daily. Patient not taking: Reported on 01/17/2018 01/25/15   Lorayne Marek, MD  nitrofurantoin, macrocrystal-monohydrate, (MACROBID) 100 MG capsule Take 1 capsule (100 mg total) by mouth 2 (two) times daily. 01/17/18   Argentina Donovan, PA-C  omeprazole (PRILOSEC) 20 MG capsule Take 1 capsule (20 mg total) by mouth daily. Patient not taking: Reported on 01/17/2018 10/02/17   Alfonse Spruce, FNP  Vitamin D, Ergocalciferol, (DRISDOL) 50000 units CAPS capsule Take 1 capsule (50,000 Units total) by mouth every 7 (seven) days. Patient not taking: Reported  on 01/17/2018 10/25/16   Alfonse Spruce, FNP     Objective:  EXAM:   Vitals:   01/17/18 0946  BP: 106/69  Pulse: 80  Temp: 98.2 F (36.8 C)  TempSrc: Oral  SpO2: 98%  Weight: 170 lb 9.6 oz (77.4 kg)  Height: 5\' 3"  (1.6 m)    General appearance : A&OX3. NAD. Non-toxic-appearing HEENT: Atraumatic and Normocephalic.  PERRLA. EOM intact.  Neck: supple, no JVD. No cervical lymphadenopathy. No thyromegaly Chest/Lungs:  Breathing-non-labored, Good air entry bilaterally, breath sounds normal without rales,  rhonchi, or wheezing  CVS: S1 S2 regular, no murmurs, gallops, rubs  L chest and breast-TTP over pectoralis major and into L axilla.  No LN.  No breast lump.  No rash.  No erythema Abdomen: Bowel sounds present, Non tender and not distended with no gaurding, rigidity or rebound. Extremities: Bilateral Lower Ext shows no edema, both legs are warm to touch with = pulse throughout Neurology:  CN II-XII grossly intact, Non focal.   Psych:  TP linear. J/I WNL. Normal speech. Appropriate eye contact and affect.  Skin:  No Rash  Data Review Lab Results  Component Value Date   HGBA1C 5.4 10/02/2017   HGBA1C 5.20 12/10/2015     Assessment & Plan   1. Pelvic pain Non-acute pelvis/abdomen With probable UTI - Urinalysis Dipstick - Urine cytology ancillary only - POCT urine pregnancy - nitrofurantoin, macrocrystal-monohydrate, (MACROBID) 100 MG capsule; Take 1 capsule (100 mg total) by mouth 2 (two) times daily.  Dispense: 6 capsule; Refill: 0  2. Acute cystitis without hematuria - Urine Culture - nitrofurantoin, macrocrystal-monohydrate, (MACROBID) 100 MG capsule; Take 1 capsule (100 mg total) by mouth 2 (two) times daily.  Dispense: 6 capsule; Refill: 0 Increase water intake   3. Muscle strain L chest wall Consider shingles prodrome, but no rash currently - ibuprofen (ADVIL,MOTRIN) 600 MG tablet; Take 1 tablet (600 mg total) by mouth 3 (three) times daily. X 7 days then prn pain  Dispense: 60 tablet; Refill: 0 - methocarbamol (ROBAXIN) 500 MG tablet; Take 1 tablet (500 mg total) by mouth 3 (three) times daily. X 7 days then prn muscle spasm  Dispense: 90 tablet; Refill: 0   Patient have been counseled extensively about nutrition and exercise  Return in about 3 months (around 04/19/2018) for assign new PCP.  The patient was given clear instructions to go to ER or return to medical center if symptoms don't improve, worsen or new problems develop. The patient verbalized understanding. The  patient was told to call to get lab results if they haven't heard anything in the next week.     Freeman Caldron, PA-C [facility] [place], [place]   01/17/2018, 10:17 AM

## 2018-01-17 NOTE — Patient Instructions (Signed)
Infeccin urinaria en los adultos  (Urinary Tract Infection, Adult)  Una infeccin urinaria (IU) puede ocurrir en cualquier lugar de las vas urinarias. Las vas urinarias incluyen lo siguiente:   Riones.   Urteres.   Vejiga.   Uretra.  Estos rganos fabrican, almacenan y eliminan la orina del organismo.  CUIDADOS EN EL HOGAR   Tome los medicamentos de venta libre y los recetados solamente como se lo haya indicado el mdico.   Si le recetaron un antibitico, tmelo como se lo haya indicado el mdico. No deje de tomar los antibiticos aunque comience a sentirse mejor.   Evite beber lo siguiente:  ? Alcohol.  ? Cafena.  ? T.  ? Bebidas con gas.   Beba suficiente lquido para mantener el pis claro o de color amarillo plido.   Concurra a todas las visitas de control como se lo haya indicado el mdico. Esto es importante.   Asegrese de lo siguiente:  ? Vaciar la vejiga con frecuencia y en su totalidad. No contener la orina durante largos perodos.  ? Vaciar la vejiga antes y despus de tener relaciones sexuales.  ? Limpiar de adelante hacia atrs despus de defecar, si es mujer. Usar cada trozo de papel una vez cuando se limpie.  SOLICITE AYUDA SI:   Siente dolor en la espalda.   Tiene fiebre.   Siente malestar estomacal (nuseas).   Vomita.   Los sntomas no mejoran despus de 3das de tratamiento.   Los sntomas desaparecen y luego reaparecen.  SOLICITE AYUDA DE INMEDIATO SI:   Siente un dolor muy intenso en la espalda.   Siente un dolor muy intenso en la parte inferior del abdomen.   Tiene vmitos y no puede retener los medicamentos ni el agua.  Esta informacin no tiene como fin reemplazar el consejo del mdico. Asegrese de hacerle al mdico cualquier pregunta que tenga.  Document Released: 04/19/2010 Document Revised: 02/21/2016 Document Reviewed: 09/20/2015  Elsevier Interactive Patient Education  2018 Elsevier Inc.

## 2018-01-19 LAB — URINE CULTURE

## 2018-01-19 LAB — URINE CYTOLOGY ANCILLARY ONLY
Chlamydia: NEGATIVE
NEISSERIA GONORRHEA: NEGATIVE
Trichomonas: NEGATIVE

## 2018-01-21 LAB — URINE CYTOLOGY ANCILLARY ONLY
BACTERIAL VAGINITIS: NEGATIVE
CANDIDA VAGINITIS: NEGATIVE

## 2018-04-23 ENCOUNTER — Encounter: Payer: Self-pay | Admitting: Nurse Practitioner

## 2018-04-23 ENCOUNTER — Ambulatory Visit: Payer: Self-pay | Attending: Nurse Practitioner | Admitting: Nurse Practitioner

## 2018-04-23 VITALS — BP 105/72 | HR 68 | Temp 98.5°F | Ht 63.0 in | Wt 169.8 lb

## 2018-04-23 DIAGNOSIS — Z7251 High risk heterosexual behavior: Secondary | ICD-10-CM

## 2018-04-23 DIAGNOSIS — N390 Urinary tract infection, site not specified: Secondary | ICD-10-CM | POA: Insufficient documentation

## 2018-04-23 DIAGNOSIS — R300 Dysuria: Secondary | ICD-10-CM

## 2018-04-23 DIAGNOSIS — N9089 Other specified noninflammatory disorders of vulva and perineum: Secondary | ICD-10-CM

## 2018-04-23 DIAGNOSIS — Z202 Contact with and (suspected) exposure to infections with a predominantly sexual mode of transmission: Secondary | ICD-10-CM | POA: Insufficient documentation

## 2018-04-23 LAB — POCT URINE PREGNANCY: PREG TEST UR: NEGATIVE

## 2018-04-23 LAB — POCT URINALYSIS DIPSTICK
Bilirubin, UA: NEGATIVE
GLUCOSE UA: NEGATIVE
Ketones, UA: NEGATIVE
NITRITE UA: NEGATIVE
PROTEIN UA: NEGATIVE
SPEC GRAV UA: 1.02 (ref 1.010–1.025)
Urobilinogen, UA: 0.2 E.U./dL
pH, UA: 5 (ref 5.0–8.0)

## 2018-04-23 NOTE — Patient Instructions (Signed)
Cold Sore A cold sore, also called a fever blister, is a skin infection that is caused by a virus. This infection causes small, fluid-filled sores to form inside of the mouth or on the lips, gums, nose, chin, or cheeks. Cold sores can spread to other parts of the body, such as the eyes or fingers. Cold sores can be spread or passed from person to person (contagious) until the sores crust over completely. Cold sores can be spread through close contact, such as kissing or sharing a drinking glass. Follow these instructions at home: Medicines  Take or apply over-the-counter and prescription medicines only as told by your doctor.  Use a cotton-tip swab to apply creams or gels to your sores. Sore Care  Do not touch the sores or pick the scabs.  Wash your hands often. Do not touch your eyes without washing your hands first.  Keep the sores clean and dry.  If directed, apply ice to the sores:  Put ice in a plastic bag.  Place a towel between your skin and the bag.  Leave the ice on for 20 minutes, 2-3 times per day. Lifestyle  Do not kiss, have oral sex, or share personal items until your sores heal.  Eat a soft, bland diet. Avoid eating hot, cold, or salty foods. These can hurt your mouth.  Use a straw if it hurts to drink out of a glass.  Avoid the sun and limit your stress if these things trigger outbreaks. If sun causes cold sores, apply sunscreen on your lips before being out in the sun. Contact a doctor if:  You have symptoms for more than two weeks.  You have pus coming from the sores.  You have redness that is spreading.  You have pain or irritation in your eye.  You get sores on your genitals.  Your sores do not heal within two weeks.  You get cold sores often. Get help right away if:  You have a fever and your symptoms suddenly get worse.  You have a headache and confusion. This information is not intended to replace advice given to you by your health care  provider. Make sure you discuss any questions you have with your health care provider. Document Released: 04/30/2012 Document Revised: 04/06/2016 Document Reviewed: 08/20/2015 Elsevier Interactive Patient Education  2018 [place].  Herpes labial (Cold Sore) El herpes labial, tambin llamado herpes febril, es una infeccin de la piel causada por un virus. Esta infeccin se manifiesta en forma de pequeas ampollas llenas de lquido dentro de la boca o en los labios, las encas, la nariz, el mentn o las [place]. El herpes labial puede diseminarse a otras partes del cuerpo, como los ojos o los dedos. Se puede transmitir o Hydrologist de [place] persona a otra hasta que las ampollas se cicatrizan por completo. El herpes labial puede transmitirse a travs del contacto directo, por ejemplo, al besar o compartir un vaso. CUIDADOS EN EL HOGAR Medicamentos  Tome o aplique los medicamentos de venta libre y los recetados solamente como se lo haya indicado el mdico.  Use un hisopo de algodn para aplicar crema o gel en las ampollas. Cuidado de las ampollas  No se toque las ampollas ni retire las Surveyor, mining.  Lvese las manos con frecuencia. No se toque los ojos sin antes lavarse las manos.  [place] y secas.  Si se lo indican, aplique hielo sobre las ampollas:  Ponga el hielo en una bolsa plstica.  Coloque una  toalla entre la piel y la bolsa de hielo.  Coloque el hielo durante 98minutos, 2 a 3veces por Training and development officer. Estilo de vida  No bese, no mantenga sexo oral ni comparta artculos de uso personal hasta que las ampollas se Glass blower/designer.  Consuma una dieta liviana y blanda. Evite consumir alimentos calientes, fros o salados. Estos pueden lastimarle la boca.  Use un sorbete si beber lquidos de un vaso le causa dolor.  Evite el sol y las situaciones de estrs si estas cosas desencadenan las erupciones. Si el sol causa el herpes labial, aplquese pantalla solar en los labios antes  de exponerse al sol. SOLICITE AYUDA SI:  Tiene sntomas durante ms de [employer].  Le sale pus de las ampollas.  El enrojecimiento se expande.  Siente dolor o irritacin en el ojo.  Tiene ampollas en los genitales.  Las ampollas no se curan en el trmino de [employer].  Le aparece herpes labial con frecuencia. SOLICITE AYUDA DE INMEDIATO SI:  Tiene fiebre y los sntomas empeoran repentinamente.  Tiene dolor de [place] y confusin. Esta informacin no tiene Marine scientist el consejo del mdico. Asegrese de hacerle al mdico cualquier pregunta que tenga. Document Released: 07/24/2012 Document Revised: 02/21/2016 Document Reviewed: 08/20/2015 Elsevier Interactive Patient Education  2018 [place] del herpes simple (Herpes Simplex Test) [employer] tipos comunes de virus del herpes simple (VHS). Estos se clasifican en tipo 1 (VHS1) y tipo 2 (VHS2). El tipo 1 con frecuencia provoca llagas peribucales en la boca o alrededor de ella y a veces en los ojos o alrededor de ellos. El tipo 2 se conoce comnmente como una enfermedad de transmisin sexual que provoca llagas en los genitales o alrededor de ellos. Ambos tipos de herpes simple pueden causar llagas e distintas zonas. Hay dos tipos de pruebas para detectar el herpes simple. Estas incluyen las siguientes:  Cultivo. Consiste en obtener y examinar [place] de lquido de una llaga abierta con un hisopo de algodn. Esto solo se puede hacer durante una infeccin activa (erupcin). Las pruebas de cultivo [place] en [place], pero son muy precisas.  Anlisis de sangre del VHS. Este anlisis no es tan preciso como un cultivo. Sin embargo, los anlisis de sangre del VHS son ms rpidos que los cultivos; con frecuencia se obtiene Art therapist. ? Prueba de anticuerpos contra el VHS. Determina la presencia de anticuerpos contra el VHS en la sangre. Los anticuerpos son protenas que produce el cuerpo para  ayudar a Sports administrator. ? Anlisis de antgenos del VHS. Determina la presencia de la bacteria del VHS (antgeno) en la sangre. El mdico puede recomendarle que se realice esta prueba si:  Cree que tiene una infeccin por el VHS.  Tiene un sistema inmunitario debilitado (inmunodeprimido) y presenta llagas que parecen erupciones de VHS alrededor de la boca o los genitales.  Tiene fiebre de origen desconocido (FOD).  Est embarazada, tiene herpes y espera tener un parto vaginal en las prximas 6 a 8 semanas. RESULTADOS Es su responsabilidad retirar el resultado del [place]. Consulte en el laboratorio o en el departamento en el que fue realizado el estudio cundo y cmo podr [employer]. Comunquese con el mdico si tiene [employer]. Rango de [employer] rangos para los valores normales pueden variar entre diferentes laboratorios y hospitales. Siempre debe consultar a su mdico despus de realizarse un anlisis u otros estudios para saber si los valores de sus [place] se  consideran dentro de los [employer]. Los hallazgos normales incluyen lo siguiente:  No se observan anticuerpos o antgenos de VHS en la sangre.  No se observan antgenos del VHS en el cultivo de lquido. Significado de los [employer] estn fuera de los valores normales Los siguientes resultados de la prueba pueden indicar que tiene una infeccin activa por el VHS:  Cultivo positivo para el VHS1 o el VHS2.  Presencia de antgenos del VHS1 o VHS2 en la sangre.  Presencia de ciertos anticuerpos contra el VHS1 o el VHS2 (IgM) en la sangre. Hable con el mdico [employer]. El mdico [employer] para Optometrist un diagnstico y Teacher, adult education un plan de tratamiento adecuado para usted. Esta informacin no tiene Marine scientist el consejo del mdico. Asegrese de hacerle al mdico cualquier pregunta que tenga. Document Released: 08/20/2013 Document  Revised: 11/20/2014 Document Reviewed: 03/17/2014 Elsevier Interactive Patient Education  2018 [employer].

## 2018-04-23 NOTE — Progress Notes (Signed)
Assessment & Plan:  Debra Allen was seen today for establish care and urinary tract infection.  Diagnoses and all orders for this visit:  Dysuria -     POCT urine pregnancy -     Urinalysis Dipstick -     Urine cytology ancillary only -     CULTURE, URINE COMPREHENSIVE -     Urinalysis, Complete  High risk heterosexual behavior -     Urine cytology ancillary only -     HIV antibody (with reflex) -     RPR  Skin tag of labia -     Ambulatory referral to Gynecology    Patient has been counseled on age-appropriate routine health concerns for screening and prevention. These are reviewed and up-to-date. Referrals have been placed accordingly. Immunizations are up-to-date or declined.    Subjective:   Chief Complaint  Patient presents with  . Establish Care    Pt. is here to establish care for primary care. Pt. stated she recently took a home pregnancy test and it was positive and was unsure if the kit was working right.   . Urinary Tract Infection    Pt. stated she think she may have a UTI, due to burning when she pee.    HPI Debra Allen 32 y.o. female presents to office today to establish care.    She reports recently taking to home with the one being positive (showing a faint light pink line).  She takes pregnancy tests every month as her sister and cousin became pregnant with the IUD so she is afraid she may become pregnant with her IUD. She also has complaints of UTI symptoms as well as vaginal symptoms today    Urinary Tract Infection Patient complains of dysuria She has had symptoms for several days. Patient also complains of vaginal discharge described as malodorous, thick and sometimes watery. There is also vulvar itching present.  Patient denies back pain and fever. Patient does not have a history of recurrent UTI.  Patient does not have a history of pyelonephritis.    Sexually Transmitted Disease Check Patient presents for sexually transmitted disease check.  Sexual history reviewed with the patient. STD exposure: denies knowledge of risky exposure.  Previous history of STD:  HSV. Current symptoms include vaginal discharge: copious, white, watery, thick and malodorous, vaginal irritation: moderate, tolerable, dysuria: moderate, tolerable, pelvic pain: mild, tolerable.  Contraception: IUD.   Review of Systems  Constitutional: Negative for fever, malaise/fatigue and weight loss.  HENT: Negative.  Negative for nosebleeds.   Eyes: Negative.  Negative for blurred vision, double vision and photophobia.  Respiratory: Negative.  Negative for cough and shortness of breath.   Cardiovascular: Negative.  Negative for chest pain, palpitations and leg swelling.  Gastrointestinal: Positive for abdominal pain (lower pelvic pain and bloating). Negative for heartburn, nausea and vomiting.  Genitourinary: Positive for dysuria. Negative for flank pain, frequency, hematuria and urgency.       SEE HPI  Musculoskeletal: Negative.  Negative for myalgias.  Neurological: Negative.  Negative for dizziness, focal weakness, seizures and headaches.  Psychiatric/Behavioral: Negative.  Negative for suicidal ideas.    Past Medical History:  Diagnosis Date  . Headache(784.0)   . Urinary tract infection     Past Surgical History:  Procedure Laterality Date  . NO PAST SURGERIES      Family History  Problem Relation Age of Onset  . Diabetes Mother   . Hypertension Maternal Grandmother   . Diabetes Maternal Grandmother   .  Cancer Paternal Grandmother   . Cancer Paternal Grandfather   . Hypertension Father   . Cancer Paternal Aunt        bladder cancer   . Asthma Other   . Obesity Other   . Sleep apnea Other   . Heart disease Other   . Other Neg Hx     Social History Reviewed with no changes to be made today.   Outpatient Medications Prior to Visit  Medication Sig Dispense Refill  . acetaminophen (TYLENOL) 325 MG tablet Take 650 mg by mouth every 6 (six) hours  as needed.    Marland Kitchen ibuprofen (ADVIL,MOTRIN) 600 MG tablet Take 1 tablet (600 mg total) by mouth 3 (three) times daily. X 7 days then prn pain 60 tablet 0  . levonorgestrel (MIRENA) 20 MCG/24HR IUD 1 each by Intrauterine route once. Lot # [id number], Exp 06/18. Due for removal in 5 years 01/01/2020    . cetirizine (ZYRTEC) 10 MG tablet Take 1 tablet (10 mg total) by mouth daily. (Patient not taking: Reported on 01/17/2018) 30 tablet 11  . ferrous sulfate (FERROUSUL) 325 (65 FE) MG tablet Take 1 tablet (325 mg total) by mouth 3 (three) times daily with meals. (Patient not taking: Reported on 10/02/2017) 90 tablet 3  . methocarbamol (ROBAXIN) 500 MG tablet Take 1 tablet (500 mg total) by mouth 3 (three) times daily. X 7 days then prn muscle spasm (Patient not taking: Reported on 04/23/2018) 90 tablet 0  . mometasone-formoterol (DULERA) 200-5 MCG/ACT AERO Inhale 2 puffs into the lungs 2 (two) times daily. (Patient not taking: Reported on 10/02/2017) 1 Inhaler 0  . Multiple Vitamins-Iron (MULTIVITAMINS WITH IRON) TABS tablet Take 1 tablet by mouth daily. (Patient not taking: Reported on 01/17/2018) 90 tablet 1  . nitrofurantoin, macrocrystal-monohydrate, (MACROBID) 100 MG capsule Take 1 capsule (100 mg total) by mouth 2 (two) times daily. 6 capsule 0  . omeprazole (PRILOSEC) 20 MG capsule Take 1 capsule (20 mg total) by mouth daily. (Patient not taking: Reported on 01/17/2018) 30 capsule 2  . Vitamin D, Ergocalciferol, (DRISDOL) 50000 units CAPS capsule Take 1 capsule (50,000 Units total) by mouth every 7 (seven) days. (Patient not taking: Reported on 01/17/2018) 8 capsule 0   Facility-Administered Medications Prior to Visit  Medication Dose Route Frequency Provider Last Rate Last Dose  . levonorgestrel (MIRENA) 20 MCG/24HR IUD   Intrauterine Once Boykin Nearing, MD        Allergies  Allergen Reactions  . Keflex [Cephalexin] Itching and Rash       Objective:    BP 105/72 (BP Location: Left Arm, Patient  Position: Sitting, Cuff Size: Normal)   Pulse 68   Temp 98.5 F (36.9 C) (Oral)   Ht '5\' 3"'$  (1.6 m)   Wt 169 lb 12.8 oz (77 kg)   SpO2 96%   BMI 30.08 kg/m  Wt Readings from Last 3 Encounters:  04/23/18 169 lb 12.8 oz (77 kg)  01/17/18 170 lb 9.6 oz (77.4 kg)  10/02/17 170 lb (77.1 kg)    Physical Exam  Constitutional: She is oriented to person, place, and time. She appears well-developed and well-nourished. She is cooperative.  HENT:  Head: Normocephalic and atraumatic.  Eyes: EOM are normal.  Neck: Normal range of motion.  Cardiovascular: Normal rate, regular rhythm and normal heart sounds. Exam reveals no gallop and no friction rub.  No murmur heard. Pulmonary/Chest: Effort normal and breath sounds normal. No tachypnea. No respiratory distress. She has no decreased breath sounds.  She has no wheezes. She has no rhonchi. She has no rales. She exhibits no tenderness.  Abdominal: Bowel sounds are normal. She exhibits distension. She exhibits no mass. There is no tenderness. There is no rebound and no guarding. No hernia.  Genitourinary:    There is no rash on the right labia. There is no rash on the left labia.  Musculoskeletal: Normal range of motion. She exhibits no edema.  Neurological: She is alert and oriented to person, place, and time. Coordination normal.  Skin: Skin is warm and dry.  Psychiatric: She has a normal mood and affect. Her behavior is normal. Judgment and thought content normal.  Nursing note and vitals reviewed.      Patient has been counseled extensively about nutrition and exercise as well as the importance of adherence with medications and regular follow-up. The patient was given clear instructions to go to ER or return to medical center if symptoms don't improve, worsen or new problems develop. The patient verbalized understanding.   Follow-up: Return for annual physical and fasting labs in November. Schedule with gynecology for skin tag removal in  vagina.   Gildardo Pounds, FNP-BC [facility] [place], [place]   04/23/2018, 10:32 AM

## 2018-04-24 LAB — URINALYSIS, COMPLETE
Bilirubin, UA: NEGATIVE
Glucose, UA: NEGATIVE
KETONES UA: NEGATIVE
Nitrite, UA: NEGATIVE
PH UA: 5.5 (ref 5.0–7.5)
Protein, UA: NEGATIVE
RBC UA: NEGATIVE
SPEC GRAV UA: 1.022 (ref 1.005–1.030)
UUROB: 0.2 mg/dL (ref 0.2–1.0)

## 2018-04-24 LAB — MICROSCOPIC EXAMINATION
Casts: NONE SEEN /lpf
RBC, UA: NONE SEEN /hpf (ref 0–2)

## 2018-04-24 LAB — HIV ANTIBODY (ROUTINE TESTING W REFLEX): HIV Screen 4th Generation wRfx: NONREACTIVE

## 2018-04-24 LAB — RPR: RPR Ser Ql: NONREACTIVE

## 2018-04-25 ENCOUNTER — Telehealth: Payer: Self-pay

## 2018-04-25 LAB — URINE CYTOLOGY ANCILLARY ONLY
Chlamydia: NEGATIVE
Neisseria Gonorrhea: NEGATIVE
TRICH (WINDOWPATH): NEGATIVE

## 2018-04-25 LAB — CULTURE, URINE COMPREHENSIVE

## 2018-04-25 NOTE — Telephone Encounter (Signed)
-----   Message from Gildardo Pounds, NP sent at 04/24/2018  8:40 AM EDT ----- HIV and syphilis are negative

## 2018-04-25 NOTE — Telephone Encounter (Signed)
CMA attempt to call patient.  No answer and left a Vm for patient to call back.  If patient call back, please inform:  HIV and syphilis are negative

## 2018-04-26 LAB — URINE CYTOLOGY ANCILLARY ONLY
Bacterial vaginitis: POSITIVE — AB
CANDIDA VAGINITIS: NEGATIVE

## 2018-04-29 ENCOUNTER — Other Ambulatory Visit: Payer: Self-pay | Admitting: Nurse Practitioner

## 2018-04-29 MED ORDER — METRONIDAZOLE 500 MG PO TABS: 500.0000 mg | ORAL_TABLET | Freq: Two times a day (BID) | ORAL | 0 refills | Status: DC

## 2018-05-01 ENCOUNTER — Telehealth: Payer: Self-pay

## 2018-05-01 ENCOUNTER — Other Ambulatory Visit: Payer: Self-pay

## 2018-05-01 MED ORDER — METRONIDAZOLE 500 MG PO TABS: 500.0000 mg | ORAL_TABLET | Freq: Two times a day (BID) | ORAL | 0 refills | Status: AC

## 2018-05-01 MED FILL — metroNIDAZOLE 500 MG TABS: 500 | 7 days supply | Qty: 14 | Fill #0

## 2018-05-01 NOTE — Telephone Encounter (Signed)
-----   Message from Gildardo Pounds, NP sent at 04/29/2018 10:51 PM EDT ----- Test positive for BV. Will send flagyl to pharmacy

## 2018-05-01 NOTE — Telephone Encounter (Signed)
CMA spoke to patient to inform her lab results and Rx.  Pt. Understood. Pt. Verified DOB.  Spanish interpreter Burman Nieves [phone] assist with the call.

## 2018-05-14 ENCOUNTER — Ambulatory Visit: Payer: Self-pay | Admitting: Nurse Practitioner

## 2018-07-08 ENCOUNTER — Ambulatory Visit: Payer: Self-pay | Admitting: Nurse Practitioner

## 2018-07-12 ENCOUNTER — Encounter: Payer: Self-pay | Admitting: Obstetrics and Gynecology

## 2018-09-20 ENCOUNTER — Ambulatory Visit (HOSPITAL_COMMUNITY)
Admission: EM | Admit: 2018-09-20 | Discharge: 2018-09-20 | Disposition: A | Discharge status: Home / Self Care | Payer: Self-pay | Attending: Internal Medicine | Admitting: Internal Medicine

## 2018-09-20 ENCOUNTER — Encounter (HOSPITAL_COMMUNITY): Payer: Self-pay | Admitting: Emergency Medicine

## 2018-09-20 DIAGNOSIS — Z79899 Other long term (current) drug therapy: Secondary | ICD-10-CM | POA: Insufficient documentation

## 2018-09-20 DIAGNOSIS — N843 Polyp of vulva: Secondary | ICD-10-CM | POA: Insufficient documentation

## 2018-09-20 DIAGNOSIS — N9089 Other specified noninflammatory disorders of vulva and perineum: Secondary | ICD-10-CM | POA: Insufficient documentation

## 2018-09-20 DIAGNOSIS — Z8249 Family history of ischemic heart disease and other diseases of the circulatory system: Secondary | ICD-10-CM | POA: Insufficient documentation

## 2018-09-20 DIAGNOSIS — Z881 Allergy status to other antibiotic agents status: Secondary | ICD-10-CM | POA: Insufficient documentation

## 2018-09-20 MED ORDER — MUPIROCIN 2 % EX OINT: 1.0000 "application " | TOPICAL_OINTMENT | Freq: Two times a day (BID) | CUTANEOUS | 0 refills | Status: DC

## 2018-09-20 MED ORDER — LIDOCAINE-EPINEPHRINE (PF) 2 %-1:200000 IJ SOLN
INTRAMUSCULAR | Status: AC
  Filled 2018-09-20: qty 20

## 2018-09-20 MED FILL — MUPIROCIN 2% OINTMENT: 2 | 10 days supply | Qty: 22 | Fill #0

## 2018-09-20 NOTE — ED Triage Notes (Signed)
Pt c/o wart on the R upper thigh for the last few months, was told she was going to hear from her PCP to get it removed but hasn't heard from them, states its getting bigger and more painful.

## 2018-09-20 NOTE — ED Provider Notes (Signed)
[place]    CSN: [id number] Arrival date & time: 09/20/18  7017     History   Chief Complaint Chief Complaint  Patient presents with  . Mass    HPI Debra Allen is a 32 y.o. female.   32 year old female comes in for skin growth that is increasing in size, and causing pain.  Patient states was seen by primary care few months ago, and at that time, was noted to have a skin tag to the right labia.  Was going to be referred to woman's for removal, but patient never received a call.  States she tried to make an appointment on her own with women's, and earliest appointment was in 2 to 3 weeks.  Patient states size has been increasing, and can cause slight irritation.  Specifically during work, with long hours of standing and walking, area becomes irritated from friction, and can cause significant pain.  Denies increased swelling, erythema, warmth.     Past Medical History:  Diagnosis Date  . Headache(784.0)   . Urinary tract infection     Patient Active Problem List   Diagnosis Date Noted  . Back pain 03/18/2015  . Abdominal pain 03/18/2015  . 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 Postpartum breast pain 01/08/2015  . Encounter for IUD insertion 12/31/2014  . Anemia 12/31/2014  . History of delivery of macrosomal infant 08/12/2014  . Mildly obese 06/10/2014    Past Surgical History:  Procedure Laterality Date  . NO PAST SURGERIES      OB History    Gravida  4   Para  4   Term  4   Preterm  0   AB  0   Living  4     SAB  0   TAB  0   Ectopic  0   Multiple  0   Live Births  4            Home Medications    Prior to Admission medications   Medication Sig Start Date End Date Taking? Authorizing Provider  acetaminophen (TYLENOL) 325 MG tablet Take 650 mg by mouth every 6 (six) hours as needed.    [provider]  cetirizine (ZYRTEC) 10 MG tablet Take 1 tablet (10 mg total) by mouth daily. Patient not taking: Reported  on 01/17/2018 05/26/16   Argentina Donovan, PA-C  ferrous sulfate (FERROUSUL) 325 (65 FE) MG tablet Take 1 tablet (325 mg total) by mouth 3 (three) times daily with meals. Patient not taking: Reported on 10/02/2017 01/25/15   Lorayne Marek, MD  ibuprofen (ADVIL,MOTRIN) 600 MG tablet Take 1 tablet (600 mg total) by mouth 3 (three) times daily. X 7 days then prn pain 01/17/18   Argentina Donovan, PA-C  levonorgestrel [facility]) 20 MCG/24HR IUD 1 each by Intrauterine route once. Lot # [id number], Exp 06/18. Due for removal in 5 years 01/01/2020    [provider]  methocarbamol (ROBAXIN) 500 MG tablet Take 1 tablet (500 mg total) by mouth 3 (three) times daily. X 7 days then prn muscle spasm Patient not taking: Reported on 04/23/2018 01/17/18   Argentina Donovan, PA-C  mometasone-formoterol (DULERA) 200-5 MCG/ACT AERO Inhale 2 puffs into the lungs 2 (two) times daily. Patient not taking: Reported on 10/02/2017 03/21/16   Argentina Donovan, PA-C  Multiple Vitamins-Iron (MULTIVITAMINS WITH IRON) TABS tablet Take 1 tablet by mouth daily. Patient not taking: Reported on 01/17/2018 01/25/15   Lorayne Marek, MD  mupirocin ointment (  BACTROBAN) 2 % Apply 1 application topically 2 (two) times daily. 09/20/18   Tasia Catchings, Dajour Pierpoint V, PA-C  nitrofurantoin, macrocrystal-monohydrate, (MACROBID) 100 MG capsule Take 1 capsule (100 mg total) by mouth 2 (two) times daily. Patient not taking: Reported on 09/20/2018 01/17/18   Argentina Donovan, PA-C  omeprazole (PRILOSEC) 20 MG capsule Take 1 capsule (20 mg total) by mouth daily. Patient not taking: Reported on 01/17/2018 10/02/17   Alfonse Spruce, FNP  Vitamin D, Ergocalciferol, (DRISDOL) 50000 units CAPS capsule Take 1 capsule (50,000 Units total) by mouth every 7 (seven) days. Patient not taking: Reported on 01/17/2018 10/25/16   Alfonse Spruce, FNP    Family History Family History  Problem Relation Age of Onset  . Diabetes Mother   . Hypertension Maternal Grandmother   .  Diabetes Maternal Grandmother   . Cancer Paternal Grandmother   . Cancer Paternal Grandfather   . Hypertension Father   . Cancer Paternal Aunt        bladder cancer   . Asthma Other   . Obesity Other   . Sleep apnea Other   . Heart disease Other   . Other Neg Hx     Social History Social History   Tobacco Use  . Smoking status: Never Smoker  . Smokeless tobacco: Never Used  Substance Use Topics  . Alcohol use: No  . Drug use: No     Allergies   Keflex [cephalexin]   Review of Systems Review of Systems  Reason unable to perform ROS: See HPI as above.     Physical Exam Triage Vital Signs ED Triage Vitals  Enc Vitals Group     BP 09/20/18 0928 (!) 109/57     Pulse Rate 09/20/18 0928 68     Resp 09/20/18 0928 16     Temp 09/20/18 0928 98 F (36.7 C)     Temp Source 09/20/18 0928 Oral     SpO2 09/20/18 0928 100 %     Weight --      Height --      Head Circumference --      Peak Flow --      Pain Score 09/20/18 0937 6     Pain Loc --      Pain Edu? --      Excl. in [place]? --    No data found.  Updated Vital Signs BP (!) 109/57 (BP Location: Right Arm) Comment: Reported BP to Nurse Kenton Kingfisher  Pulse 68   Temp 98 F (36.7 C) (Oral)   Resp 16   SpO2 100%   Physical Exam  Constitutional: She is oriented to person, place, and time. She appears well-developed and well-nourished. No distress.  HENT:  Head: Normocephalic and atraumatic.  Eyes: Pupils are equal, round, and reactive to light. Conjunctivae are normal.  Genitourinary:     Neurological: She is alert and oriented to person, place, and time.  Skin: She is not diaphoretic.     UC Treatments / Results  Labs (all labs ordered are listed, but only abnormal results are displayed) Labs Reviewed  SURGICAL PATHOLOGY    EKG None  Radiology No results found.  Procedures Procedures (including critical care time)  Consent was given by patient verbally.  Risks discussed including bleeding,  pain, poor cosmetic results, damage to other organs, further intervention needed.  Alternatives discussed, including no treatment, referral.  Patient expresses understanding and would like to proceed with biopsy.  Location: Right labia  5 cc  of lidocaine 2% with epi injected to labia from site of the skin tag. Area cleaned with Hibiclens.  A skin tag removed with scissors. One figure 8 stitch using 3-0 nylon for bleeding control.  Hemostasis achieved with figure-of-eight stitch.  Area dressed with antibiotic ointment and bulky dressing.  Patient tolerated procedure well without immediate complications.   Medications Ordered in UC Medications - No data to display  Initial Impression / Assessment and Plan / UC Course  I have reviewed the triage vital signs and the nursing notes.  Pertinent labs & imaging results that were available during my care of the patient were reviewed by me and considered in my medical decision making (see chart for details).    One skin tag removed from right labia.  Pathology sent.  Patient tolerated procedure well.  Wound care instructions provided.  Patient to follow-up here with PCP in 7 days for suture removal.  Return precautions given.  Patient expresses understanding and agrees to plan.  Case discussed with Dr Valere Dross, who also examined patient, and agrees to plan.  Final Clinical Impressions(s) / UC Diagnoses   Final diagnoses:  Labial skin tag    ED Prescriptions    Medication Sig Dispense Auth. Provider   mupirocin ointment (BACTROBAN) 2 % Apply 1 application topically 2 (two) times daily. 22 g Tobin Chad, [place] 09/20/18 1514

## 2018-09-20 NOTE — Discharge Instructions (Signed)
Your skin growth was removed today. Keep wound clean and dry. You can clean gently with soap and water. Do not soak area in water. You can use bactroban to dress the area.  Monitor for spreading redness, increased warmth, increased swelling, fever, follow up for reevaluation needed. Otherwise follow up in 7 days for suture removal. Still keep your appointment with GYN for further evaluation.

## 2018-09-25 ENCOUNTER — Telehealth (HOSPITAL_COMMUNITY): Payer: Self-pay | Admitting: Emergency Medicine

## 2018-09-25 NOTE — Telephone Encounter (Signed)
Attempted to reach patient. No answer at this time.   

## 2018-09-25 NOTE — Telephone Encounter (Signed)
Patient called back, informed her that her test results were benign and to follow up with gyn. Pt verbalized understanding.

## 2018-09-26 ENCOUNTER — Ambulatory Visit: Payer: Self-pay

## 2018-09-26 ENCOUNTER — Ambulatory Visit (HOSPITAL_COMMUNITY)
Admission: EM | Admit: 2018-09-26 | Discharge: 2018-09-26 | Disposition: A | Discharge status: Home / Self Care | Payer: Self-pay

## 2018-09-26 ENCOUNTER — Ambulatory Visit: Payer: Self-pay | Attending: Family Medicine

## 2018-09-26 DIAGNOSIS — N843 Polyp of vulva: Secondary | ICD-10-CM

## 2018-09-26 DIAGNOSIS — Z4802 Encounter for removal of sutures: Secondary | ICD-10-CM

## 2018-09-26 NOTE — ED Notes (Signed)
1 suture removed from R vaginal lip. No bleeding. Pt had no further questions.

## 2018-09-26 NOTE — ED Triage Notes (Signed)
Pt needs 1 suture removed from outside her labia folds. Pt offered chaperone, pt denies needing one.

## 2018-10-07 ENCOUNTER — Encounter: Payer: Self-pay | Admitting: Family Medicine

## 2019-02-25 ENCOUNTER — Ambulatory Visit: Payer: Self-pay | Attending: Nurse Practitioner | Admitting: Nurse Practitioner

## 2019-02-25 ENCOUNTER — Other Ambulatory Visit: Payer: Self-pay

## 2019-02-27 ENCOUNTER — Other Ambulatory Visit: Payer: Self-pay

## 2019-02-27 ENCOUNTER — Ambulatory Visit: Payer: Self-pay | Attending: Nurse Practitioner | Admitting: Physician Assistant

## 2019-02-27 DIAGNOSIS — T148XXA Other injury of unspecified body region, initial encounter: Secondary | ICD-10-CM

## 2019-02-27 DIAGNOSIS — N898 Other specified noninflammatory disorders of vagina: Secondary | ICD-10-CM

## 2019-02-27 DIAGNOSIS — Z789 Other specified health status: Secondary | ICD-10-CM

## 2019-02-27 DIAGNOSIS — R079 Chest pain, unspecified: Secondary | ICD-10-CM

## 2019-02-27 DIAGNOSIS — M5489 Other dorsalgia: Secondary | ICD-10-CM

## 2019-02-27 NOTE — Progress Notes (Signed)
Patient ID: Debra Allen, female   DOB: 1986/07/18, 33 y.o.   MRN: [id number]   Virtual Visit via Telephone Note  I connected with Debra Allen on 02/27/19 at  1:30 PM EDT by telephone and verified that I am speaking with the correct person using two identifiers.   I discussed the limitations, risks, security and privacy concerns of performing an evaluation and management service by telephone and the availability of in person appointments. I also discussed with the patient that there may be a patient responsible charge related to this service. The patient expressed understanding and agreed to proceed. Patient is at home and the only person on the line other than the interpreter.  I am in my office.     History of Present Illness:  Patient scheduled appt to see about getting cholesterol checked but on the phone, patient c/o pain in upper back and chest that is worse when she takes a deep breath.  Sometimes tingling in tips of fingers. This has been occurring for about 2 weeks.  Intermittent. Admits to anxiety.  She thinks her cholesterol might be high. Seems worse with laying down, improves with deep breathing. Worse when feels stressed.  Her cholesterol was checked in 2018 and was normal.  No radiating pain.  Non-smoker.  She is currently not in pain.  Describes as sharp and lasts a few seconds.    Also c/o vaginal discharge.  No pelvic pain or fever.   FH: Maternal gf-heart problems in his 49s; mom with rapid heart rate in her 4s.  No MI  [place] with language resources translating    Observations/Objective:  Tp linear.  Speech is clear   Assessment and Plan: 1. Chest pain, unspecified type Likely stress related or musculoskeletal but unable to adequately provide care for this by phone visit.  I have advised she go to the ED/call 911 if pain returns or worsens.  2. Muscle strain  3. Vaginal discharge Needs in person appt or can have assessed if she goes to ED for  assessment  4. Language barrier stratus interpreters used and additional time performing visit was required.     Follow Up Instructions: 6 weeks with PCP   I discussed the assessment and treatment plan with the patient. The patient was provided an opportunity to ask questions and all were answered. The patient agreed with the plan and demonstrated an understanding of the instructions.   The patient was advised to call back or seek an in-person evaluation if the symptoms worsen or if the condition fails to improve as anticipated.  I provided 25 minutes of non-face-to-face time during this encounter.   Freeman Caldron, PA-C

## 2019-02-27 NOTE — Progress Notes (Signed)
Patient is complaining regarding pain in the middle of the chest travel to the back, mainly at night, and would like to see if it is high cholesterol

## 2019-04-29 ENCOUNTER — Ambulatory Visit: Payer: Self-pay | Admitting: *Deleted

## 2019-04-29 NOTE — Telephone Encounter (Addendum)
Pt called stating that had been in contact with someone from her job. They work together and use the same tools. The last day of contact was Friday when that person tested positive. She now has a cough, headache and sore throat. Advised her to notify her provider and she said she did and was recommended to go to the free testing that was going to be done on Friday off [employer]. She did not want to wait until then. Advised that she could try the testing site at [facility] or the [facility] and she is going to try [facility]. Advised to call back with any increase in symptoms and call 911 for respiratory distress. Verbal understanding.  Answer Assessment - Initial Assessment Questions 1. CLOSE CONTACT: "Who is the person with the confirmed or suspected COVID-19 infection that you were exposed to?"     Co worker in the same buiding 2. PLACE of CONTACT: "Where were you when you were exposed to COVID-19?" (e.g., home, school, medical waiting room; which city?)     At work 3. TYPE of CONTACT: "How much contact was there?" (e.g., sitting next to, live in same house, work in same office, same building)     Used the same work tools 4. DURATION of CONTACT: "How long were you in contact with the COVID-19 patient?" (e.g., a few seconds, passed by person, a few minutes, live with the patient)     Work side by side 5. DATE of CONTACT: "When did you have contact with a COVID-19 patient?" (e.g., how many days ago)     Last Friday 6. TRAVEL: "Have you traveled out of the country recently?" If so, "When and where?"     * Also ask about out-of-state travel, since the CDC has identified some high-risk cities for community spread in the [place].     * Note: Travel becomes less relevant if there is widespread community transmission where the patient lives.     no 7. COMMUNITY SPREAD: "Are there lots of cases of COVID-19 (community spread) where you live?" (See public health department website, if unsure)      In the community 8. SYMPTOMS: "Do you have any symptoms?" (e.g., fever, cough, breathing difficulty)     Throat hurt Sunday morning. Headaches come and goes.  9. PREGNANCY OR POSTPARTUM: "Is there any chance you are pregnant?" "When was your last menstrual period?" "Did you deliver in the last 2 weeks?"     Not pregnant.LMP 5 years ago 54. HIGH RISK: "Do you have any heart or lung problems? Do you have a weak immune system?" (e.g., CHF, COPD, asthma, HIV positive, chemotherapy, renal failure, diabetes mellitus, sickle cell anemia)       no  Protocols used: CORONAVIRUS (COVID-19) EXPOSURE-A-AH

## 2019-04-29 NOTE — Telephone Encounter (Signed)
  Reason for Disposition . [1] COVID-19 infection suspected by caller or triager AND [2] mild symptoms (cough, fever, or others) AND [4] no complications or SOB  Protocols used: CORONAVIRUS (COVID-19) DIAGNOSED OR SUSPECTED-A-AH

## 2019-04-30 ENCOUNTER — Telehealth: Payer: Self-pay | Admitting: Nurse Practitioner

## 2019-04-30 NOTE — Telephone Encounter (Signed)
Patient states she has chills, sore throat and muscle pain. Patient states she would like to get tested for COVID. Please follow up.

## 2019-04-30 NOTE — Telephone Encounter (Signed)
Patients call taken.  Patient identified by name and date of birth.  Patient called in for Covid-19 advise.  Patient was identified by name and date of birth.  Patient has had a fever earlier in the week.  Patient endorses a sore throat and a slight non productive cough.  Patient also has body aches and weakness.    Patient given precautions to self isolated for the present moment.  Patients work is requesting patient have a COVID test prior to returning to work.  Patient made an appointment tomorrow with walkin.    Patient acknowledged understanding of instructions.

## 2019-05-01 ENCOUNTER — Other Ambulatory Visit: Payer: Self-pay

## 2019-05-01 ENCOUNTER — Ambulatory Visit: Payer: Self-pay | Attending: Nurse Practitioner | Admitting: Physician Assistant

## 2019-05-01 NOTE — Progress Notes (Signed)
Virtual Visit via Telephone Note  I connected with Debra Allen on 05/01/19 at 10:30 AM EDT by telephone and verified that I am speaking with the correct person using two identifiers.   I discussed the limitations, risks, security and privacy concerns of performing an evaluation and management service by telephone and the availability of in person appointments. I also discussed with the patient that there may be a patient responsible charge related to this service. The patient expressed understanding and agreed to proceed.  Patient location: My Location:  [place] office Persons on the call:     History of Present Illness:    Observations/Objective:   Assessment and Plan:   Follow Up Instructions:    I discussed the assessment and treatment plan with the patient. The patient was provided an opportunity to ask questions and all were answered. The patient agreed with the plan and demonstrated an understanding of the instructions.   The patient was advised to call back or seek an in-person evaluation if the symptoms worsen or if the condition fails to improve as anticipated.  I provided *** minutes of non-face-to-face time during this encounter.   Freeman Caldron, PA-C  Patient ID: Debra Allen, female   DOB: 1986-09-22, 33 y.o.   MRN: [id number]

## 2019-05-02 ENCOUNTER — Ambulatory Visit: Payer: Self-pay | Admitting: Nurse Practitioner

## 2019-08-26 ENCOUNTER — Other Ambulatory Visit: Payer: Self-pay

## 2019-08-26 DIAGNOSIS — Z20822 Contact with and (suspected) exposure to covid-19: Secondary | ICD-10-CM

## 2019-08-27 LAB — NOVEL CORONAVIRUS, NAA: SARS-CoV-2, NAA: NOT DETECTED

## 2019-12-23 ENCOUNTER — Other Ambulatory Visit: Payer: Self-pay | Admitting: Obstetrics and Gynecology

## 2019-12-23 DIAGNOSIS — N6320 Unspecified lump in the left breast, unspecified quadrant: Secondary | ICD-10-CM

## 2019-12-29 ENCOUNTER — Ambulatory Visit: Payer: Self-pay | Admitting: Obstetrics and Gynecology

## 2019-12-29 DIAGNOSIS — Z0289 Encounter for other administrative examinations: Secondary | ICD-10-CM

## 2020-01-01 ENCOUNTER — Ambulatory Visit
Admission: RE | Admit: 2020-01-01 | Discharge: 2020-01-01 | Disposition: A | Discharge status: Home / Self Care | Payer: PRIVATE HEALTH INSURANCE | Source: Ambulatory Visit | Attending: Obstetrics and Gynecology | Admitting: Obstetrics and Gynecology

## 2020-01-01 ENCOUNTER — Other Ambulatory Visit: Payer: Self-pay

## 2020-01-01 DIAGNOSIS — N6320 Unspecified lump in the left breast, unspecified quadrant: Secondary | ICD-10-CM

## 2020-02-21 ENCOUNTER — Ambulatory Visit: Payer: PRIVATE HEALTH INSURANCE

## 2020-05-30 ENCOUNTER — Other Ambulatory Visit: Payer: Self-pay

## 2020-05-30 ENCOUNTER — Ambulatory Visit (HOSPITAL_COMMUNITY)
Admission: EM | Admit: 2020-05-30 | Discharge: 2020-05-30 | Disposition: A | Discharge status: Home / Self Care | Payer: No Typology Code available for payment source | Attending: Urgent Care | Admitting: Urgent Care

## 2020-05-30 ENCOUNTER — Encounter (HOSPITAL_COMMUNITY): Payer: Self-pay

## 2020-05-30 DIAGNOSIS — R300 Dysuria: Secondary | ICD-10-CM | POA: Diagnosis not present

## 2020-05-30 DIAGNOSIS — R319 Hematuria, unspecified: Secondary | ICD-10-CM

## 2020-05-30 DIAGNOSIS — K921 Melena: Secondary | ICD-10-CM | POA: Insufficient documentation

## 2020-05-30 DIAGNOSIS — R350 Frequency of micturition: Secondary | ICD-10-CM | POA: Diagnosis present

## 2020-05-30 DIAGNOSIS — R109 Unspecified abdominal pain: Secondary | ICD-10-CM | POA: Diagnosis present

## 2020-05-30 DIAGNOSIS — Z3202 Encounter for pregnancy test, result negative: Secondary | ICD-10-CM | POA: Diagnosis not present

## 2020-05-30 DIAGNOSIS — R1031 Right lower quadrant pain: Secondary | ICD-10-CM | POA: Insufficient documentation

## 2020-05-30 DIAGNOSIS — N10 Acute tubulo-interstitial nephritis: Secondary | ICD-10-CM | POA: Insufficient documentation

## 2020-05-30 LAB — POCT URINALYSIS DIP (DEVICE)
Bilirubin Urine: NEGATIVE
Glucose, UA: NEGATIVE mg/dL
Ketones, ur: NEGATIVE mg/dL
Leukocytes,Ua: NEGATIVE
Nitrite: NEGATIVE
Protein, ur: NEGATIVE mg/dL
Specific Gravity, Urine: 1.025 (ref 1.005–1.030)
Urobilinogen, UA: 0.2 mg/dL (ref 0.0–1.0)
pH: 6 (ref 5.0–8.0)

## 2020-05-30 LAB — POC URINE PREG, ED: Preg Test, Ur: NEGATIVE

## 2020-05-30 MED ORDER — CIPROFLOXACIN HCL 500 MG PO TABS: 500.0000 mg | ORAL_TABLET | Freq: Two times a day (BID) | ORAL | 0 refills | Status: DC

## 2020-05-30 NOTE — ED Triage Notes (Signed)
Pt c/o right flank pain x2 weeks, dysuria symptoms: burning on urination, urgency, frequency.  Pt also reports nausea onset last night, dark "black stools" today and stomach bloating.   Denies fever, chills, v/d.

## 2020-05-30 NOTE — ED Provider Notes (Signed)
[place]   MRN: [id number] DOB: 1986/06/12  Subjective:   Debra Allen is a 34 y.o. female presenting for 2-week history of persistent and worsening dysuria, urinary frequency, urinary urgency, right flank pain, right-sided abdominal pain.  Reports that last night she had a dark stool and felt like she had seen blood in the toilet.  Patient is very concerned as one family member told her she could have problems with her gallbladder.  Denies fever, nausea, vomiting, recent antibiotic use, recent hospitalizations.  Patient does not have a PCP.   Current Facility-Administered Medications:  .  levonorgestrel (MIRENA) 20 MCG/24HR IUD, , Intrauterine, Once, Funches, Josalyn, MD  Current Outpatient Medications:  .  ibuprofen (ADVIL,MOTRIN) 600 MG tablet, Take 1 tablet (600 mg total) by mouth 3 (three) times daily. X 7 days then prn pain, Disp: 60 tablet, Rfl: 0 .  levonorgestrel (MIRENA) 20 MCG/24HR IUD, 1 each by Intrauterine route once. Lot # [id number], Exp 06/18. Due for removal in 5 years 01/01/2020, Disp: , Rfl:  .  acetaminophen (TYLENOL) 325 MG tablet, Take 650 mg by mouth every 6 (six) hours as needed., Disp: , Rfl:  .  cetirizine (ZYRTEC) 10 MG tablet, Take 1 tablet (10 mg total) by mouth daily. (Patient not taking: Reported on 01/17/2018), Disp: 30 tablet, Rfl: 11 .  ferrous sulfate (FERROUSUL) 325 (65 FE) MG tablet, Take 1 tablet (325 mg total) by mouth 3 (three) times daily with meals. (Patient not taking: Reported on 10/02/2017), Disp: 90 tablet, Rfl: 3 .  methocarbamol (ROBAXIN) 500 MG tablet, Take 1 tablet (500 mg total) by mouth 3 (three) times daily. X 7 days then prn muscle spasm (Patient not taking: Reported on 04/23/2018), Disp: 90 tablet, Rfl: 0 .  mometasone-formoterol (DULERA) 200-5 MCG/ACT AERO, Inhale 2 puffs into the lungs 2 (two) times daily. (Patient not taking: Reported on 10/02/2017), Disp: 1 Inhaler, Rfl: 0 .  Multiple Vitamins-Iron (MULTIVITAMINS WITH  IRON) TABS tablet, Take 1 tablet by mouth daily. (Patient not taking: Reported on 01/17/2018), Disp: 90 tablet, Rfl: 1 .  mupirocin ointment (BACTROBAN) 2 %, Apply 1 application topically 2 (two) times daily. (Patient not taking: Reported on 02/27/2019), Disp: 22 g, Rfl: 0 .  omeprazole (PRILOSEC) 20 MG capsule, Take 1 capsule (20 mg total) by mouth daily. (Patient not taking: Reported on 01/17/2018), Disp: 30 capsule, Rfl: 2 .  Vitamin D, Ergocalciferol, (DRISDOL) 50000 units CAPS capsule, Take 1 capsule (50,000 Units total) by mouth every 7 (seven) days. (Patient not taking: Reported on 01/17/2018), Disp: 8 capsule, Rfl: 0   Allergies  Allergen Reactions  . Keflex [Cephalexin] Itching and Rash    Past Medical History:  Diagnosis Date  . Headache(784.0)   . Urinary tract infection      Past Surgical History:  Procedure Laterality Date  . NO PAST SURGERIES      Family History  Problem Relation Age of Onset  . Diabetes Mother   . Hypertension Maternal Grandmother   . Diabetes Maternal Grandmother   . Cancer Paternal Grandmother   . Cancer Paternal Grandfather   . Hypertension Father   . Cancer Paternal Aunt        bladder cancer   . Asthma Other   . Obesity Other   . Sleep apnea Other   . Heart disease Other   . Other Neg Hx     Social History   Tobacco Use  . Smoking status: Never Smoker  . Smokeless tobacco: Never Used  Vaping  Use  . Vaping Use: Never used  Substance Use Topics  . Alcohol use: No  . Drug use: No    ROS   Objective:   Vitals: BP 115/79 (BP Location: Right Arm)   Pulse 63   Temp 97.9 F (36.6 C) (Oral)   Resp 18   SpO2 100%   Physical Exam Exam conducted with a chaperone present (RN Verdis Frederickson).  Constitutional:      General: She is not in acute distress.    Appearance: Normal appearance. She is well-developed and normal weight. She is not ill-appearing, toxic-appearing or diaphoretic.  HENT:     Head: Normocephalic and atraumatic.     Right  Ear: External ear normal.     Left Ear: External ear normal.     Nose: Nose normal.     Mouth/Throat:     Mouth: Mucous membranes are moist.     Pharynx: Oropharynx is clear.  Eyes:     General: No scleral icterus.    Extraocular Movements: Extraocular movements intact.     Pupils: Pupils are equal, round, and reactive to light.  Cardiovascular:     Rate and Rhythm: Normal rate and regular rhythm.     Pulses: Normal pulses.     Heart sounds: Normal heart sounds. No murmur heard.  No friction rub. No gallop.   Pulmonary:     Effort: Pulmonary effort is normal. No respiratory distress.     Breath sounds: Normal breath sounds. No stridor. No wheezing, rhonchi or rales.  Abdominal:     General: Bowel sounds are normal. There is no distension.     Palpations: Abdomen is soft. There is no mass.     Tenderness: There is abdominal tenderness. There is right CVA tenderness. There is no left CVA tenderness, guarding or rebound.     Comments: Right-sided flank pain  Genitourinary:    Comments: No evidence of hemorrhoid, fissure.  No tenderness on exam. Skin:    General: Skin is warm and dry.     Coloration: Skin is not pale.     Findings: No rash.  Neurological:     General: No focal deficit present.     Mental Status: She is alert and oriented to person, place, and time.  Psychiatric:        Mood and Affect: Mood normal.        Behavior: Behavior normal.        Thought Content: Thought content normal.        Judgment: Judgment normal.     Results for orders placed or performed during the hospital encounter of 05/30/20 (from the past 24 hour(s))  POC urine pregnancy     Status: None   Collection Time: 05/30/20  6:28 PM  Result Value Ref Range   Preg Test, Ur NEGATIVE NEGATIVE  POCT urinalysis dip (device)     Status: Abnormal   Collection Time: 05/30/20  6:28 PM  Result Value Ref Range   Glucose, UA NEGATIVE NEGATIVE mg/dL   Bilirubin Urine NEGATIVE NEGATIVE   Ketones, ur  NEGATIVE NEGATIVE mg/dL   Specific Gravity, Urine 1.025 1.005 - 1.030   Hgb urine dipstick TRACE (A) NEGATIVE   pH 6.0 5.0 - 8.0   Protein, ur NEGATIVE NEGATIVE mg/dL   Urobilinogen, UA 0.2 0.0 - 1.0 mg/dL   Nitrite NEGATIVE NEGATIVE   Leukocytes,Ua NEGATIVE NEGATIVE    Assessment and Plan :   PDMP not reviewed this encounter.  1. Acute pyelonephritis  2. Dysuria   3. Urinary frequency   4. Right flank pain   5. Right lower quadrant abdominal pain   6. Bloody stools     Will cover for pyelonephritis despite negative nitrite and leukocytes.  Counseled on possibility of renal stone.  Low suspicion for gallbladder disease, biliary colic given physical exam findings.  Recommended patient follow-up with new PCP, information provided to her.  Also provided patient with a referral to [facility] gastroenterology for work-up of blood in her stools.   Jaynee Eagles, [place] 06/02/20 [phone]

## 2020-06-01 LAB — URINE CULTURE: Culture: NO GROWTH

## 2020-06-02 ENCOUNTER — Encounter (HOSPITAL_COMMUNITY): Payer: Self-pay | Admitting: Urgent Care

## 2020-06-09 ENCOUNTER — Emergency Department (HOSPITAL_COMMUNITY)
Admission: EM | Admit: 2020-06-09 | Discharge: 2020-06-10 | Disposition: A | Discharge status: Home / Self Care | Payer: No Typology Code available for payment source | Attending: Emergency Medicine | Admitting: Emergency Medicine

## 2020-06-09 ENCOUNTER — Emergency Department (HOSPITAL_COMMUNITY): Payer: No Typology Code available for payment source

## 2020-06-09 ENCOUNTER — Encounter (HOSPITAL_COMMUNITY): Payer: Self-pay | Admitting: Pediatrics

## 2020-06-09 ENCOUNTER — Other Ambulatory Visit: Payer: Self-pay

## 2020-06-09 DIAGNOSIS — R17 Unspecified jaundice: Secondary | ICD-10-CM

## 2020-06-09 DIAGNOSIS — R109 Unspecified abdominal pain: Secondary | ICD-10-CM | POA: Diagnosis present

## 2020-06-09 DIAGNOSIS — K8020 Calculus of gallbladder without cholecystitis without obstruction: Secondary | ICD-10-CM | POA: Diagnosis not present

## 2020-06-09 LAB — CBC WITH DIFFERENTIAL/PLATELET
Abs Immature Granulocytes: 0.05 10*3/uL (ref 0.00–0.07)
Basophils Absolute: 0 10*3/uL (ref 0.0–0.1)
Basophils Relative: 1 %
Eosinophils Absolute: 0.1 10*3/uL (ref 0.0–0.5)
Eosinophils Relative: 1 %
HCT: 39.8 % (ref 36.0–46.0)
Hemoglobin: 13.1 g/dL (ref 12.0–15.0)
Immature Granulocytes: 1 %
Lymphocytes Relative: 29 %
Lymphs Abs: 2.5 10*3/uL (ref 0.7–4.0)
MCH: 29.6 pg (ref 26.0–34.0)
MCHC: 32.9 g/dL (ref 30.0–36.0)
MCV: 89.8 fL (ref 80.0–100.0)
Monocytes Absolute: 0.4 10*3/uL (ref 0.1–1.0)
Monocytes Relative: 5 %
Neutro Abs: 5.5 10*3/uL (ref 1.7–7.7)
Neutrophils Relative %: 63 %
Platelets: 226 10*3/uL (ref 150–400)
RBC: 4.43 MIL/uL (ref 3.87–5.11)
RDW: 12.8 % (ref 11.5–15.5)
WBC: 8.6 10*3/uL (ref 4.0–10.5)
nRBC: 0 % (ref 0.0–0.2)

## 2020-06-09 LAB — I-STAT BETA HCG BLOOD, ED (MC, WL, AP ONLY): I-stat hCG, quantitative: <5 m[IU]/mL (ref <5)

## 2020-06-09 LAB — URINALYSIS, ROUTINE W REFLEX MICROSCOPIC
Bacteria, UA: NONE SEEN
Bilirubin Urine: NEGATIVE
Glucose, UA: NEGATIVE mg/dL
Ketones, ur: NEGATIVE mg/dL
Leukocytes,Ua: NEGATIVE
Nitrite: NEGATIVE
Protein, ur: NEGATIVE mg/dL
Specific Gravity, Urine: 1.024 (ref 1.005–1.030)
pH: 5 (ref 5.0–8.0)

## 2020-06-09 LAB — COMPREHENSIVE METABOLIC PANEL
ALT: 40 U/L (ref 0–44)
AST: 22 U/L (ref 15–41)
Albumin: 4.5 g/dL (ref 3.5–5.0)
Alkaline Phosphatase: 54 U/L (ref 38–126)
Anion gap: 10 (ref 5–15)
BUN: 10 mg/dL (ref 6–20)
CO2: 25 mmol/L (ref 22–32)
Calcium: 9.7 mg/dL (ref 8.9–10.3)
Chloride: 104 mmol/L (ref 98–111)
Creatinine, Ser: 0.59 mg/dL (ref 0.44–1.00)
GFR calc Af Amer: >60 mL/min (ref >60)
GFR calc non Af Amer: >60 mL/min (ref >60)
Glucose, Bld: 119 mg/dL — ABNORMAL HIGH (ref 70–99)
Potassium: 3.9 mmol/L (ref 3.5–5.1)
Sodium: 139 mmol/L (ref 135–145)
Total Bilirubin: 3.6 mg/dL — ABNORMAL HIGH (ref 0.3–1.2)
Total Protein: 7.6 g/dL (ref 6.5–8.1)

## 2020-06-09 NOTE — ED Triage Notes (Signed)
Patient stated she was seen 2 weeks ago for right sided flank pain and was told to come back to ED for continued symptoms d/t inability to follow up w/ out patient referral.

## 2020-06-10 MED ORDER — ONDANSETRON 4 MG PO TBDP: 4.0000 mg | ORAL_TABLET | Freq: Three times a day (TID) | ORAL | 0 refills | Status: DC | PRN

## 2020-06-10 MED ORDER — CYCLOBENZAPRINE HCL 10 MG PO TABS: 10.0000 mg | ORAL_TABLET | Freq: Two times a day (BID) | ORAL | 0 refills | Status: DC | PRN

## 2020-06-10 MED ORDER — OXYCODONE HCL 5 MG PO TABS: 5.0000 mg | ORAL_TABLET | ORAL | 0 refills | Status: DC | PRN

## 2020-06-10 NOTE — ED Provider Notes (Signed)
[facility] EMERGENCY DEPARTMENT Provider Note   CSN: [id number] Arrival date & time: 06/09/20  1850     History Chief Complaint  Patient presents with  . Flank Pain    Debra Allen is a 34 y.o. female.  HPI     34yo female presents with concern for waxing and waning right flank pain.  Reports pain has been present over the last 2 weeks. Was seen at urgent care and told to come to ED if symptoms worsened.  Right flank pain has been coming and going, sharp pain, like cramping/contractsion.  Reports it is worse after eating, specifically eating fatty foods and has improved somewhat with cutting out greasy foods.  Also reports pain worsened when she is on her feet for long periods of time and with movement.  Pain 8/10 but improves with tylenol.  Has had nausea, denies vomiting. Had mild diarrhea for days. No dysuria, urinary symptoms.   Denies fever. Past Medical History:  Diagnosis Date  . Headache(784.0)   . Urinary tract infection     Patient Active Problem List   Diagnosis Date Noted  . Back pain 03/18/2015  . Abdominal pain 03/18/2015  . 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 Postpartum breast pain 01/08/2015  . Encounter for IUD insertion 12/31/2014  . Anemia 12/31/2014  . History of delivery of macrosomal infant 08/12/2014  . Mildly obese 06/10/2014    Past Surgical History:  Procedure Laterality Date  . NO PAST SURGERIES       OB History    Gravida  4   Para  4   Term  4   Preterm  0   AB  0   Living  4     SAB  0   TAB  0   Ectopic  0   Multiple  0   Live Births  4           Family History  Problem Relation Age of Onset  . Diabetes Mother   . Hypertension Maternal Grandmother   . Diabetes Maternal Grandmother   . Cancer Paternal Grandmother   . Cancer Paternal Grandfather   . Hypertension Father   . Cancer Paternal Aunt        bladder cancer   . Asthma Other   . Obesity Other   . Sleep apnea Other     . Heart disease Other   . Other Neg Hx     Social History   Tobacco Use  . Smoking status: Never Smoker  . Smokeless tobacco: Never Used  Vaping Use  . Vaping Use: Never used  Substance Use Topics  . Alcohol use: No  . Drug use: No    Home Medications Prior to Admission medications   Medication Sig Start Date End Date Taking? Authorizing Provider  acetaminophen (TYLENOL) 325 MG tablet Take 650 mg by mouth every 6 (six) hours as needed.    [provider]  cetirizine (ZYRTEC) 10 MG tablet Take 1 tablet (10 mg total) by mouth daily. Patient not taking: Reported on 01/17/2018 05/26/16   Argentina Donovan, PA-C  ciprofloxacin (CIPRO) 500 MG tablet Take 1 tablet (500 mg total) by mouth 2 (two) times daily. 05/30/20   Jaynee Eagles, PA-C  cyclobenzaprine (FLEXERIL) 10 MG tablet Take 1 tablet (10 mg total) by mouth 2 (two) times daily as needed for muscle spasms. 06/10/20   Gareth Morgan, MD  ferrous sulfate (FERROUSUL) 325 (65 FE) MG tablet Take 1 tablet (  325 mg total) by mouth 3 (three) times daily with meals. Patient not taking: Reported on 10/02/2017 01/25/15   Lorayne Marek, MD  ibuprofen (ADVIL,MOTRIN) 600 MG tablet Take 1 tablet (600 mg total) by mouth 3 (three) times daily. X 7 days then prn pain 01/17/18   Argentina Donovan, PA-C  levonorgestrel [facility]) 20 MCG/24HR IUD 1 each by Intrauterine route once. Lot # [id number], Exp 06/18. Due for removal in 5 years 01/01/2020    [provider]  methocarbamol (ROBAXIN) 500 MG tablet Take 1 tablet (500 mg total) by mouth 3 (three) times daily. X 7 days then prn muscle spasm Patient not taking: Reported on 04/23/2018 01/17/18   Argentina Donovan, PA-C  mometasone-formoterol (DULERA) 200-5 MCG/ACT AERO Inhale 2 puffs into the lungs 2 (two) times daily. Patient not taking: Reported on 10/02/2017 03/21/16   Argentina Donovan, PA-C  Multiple Vitamins-Iron (MULTIVITAMINS WITH IRON) TABS tablet Take 1 tablet by mouth daily. Patient not  taking: Reported on 01/17/2018 01/25/15   Lorayne Marek, MD  mupirocin ointment (BACTROBAN) 2 % Apply 1 application topically 2 (two) times daily. Patient not taking: Reported on 02/27/2019 09/20/18   Ok Edwards, PA-C  omeprazole (PRILOSEC) 20 MG capsule Take 1 capsule (20 mg total) by mouth daily. Patient not taking: Reported on 01/17/2018 10/02/17   Alfonse Spruce, FNP  ondansetron (ZOFRAN ODT) 4 MG disintegrating tablet Take 1 tablet (4 mg total) by mouth every 8 (eight) hours as needed for nausea or vomiting. 06/10/20   Gareth Morgan, MD  oxyCODONE (ROXICODONE) 5 MG immediate release tablet Take 1 tablet (5 mg total) by mouth every 4 (four) hours as needed for severe pain. 06/10/20   Gareth Morgan, MD  Vitamin D, Ergocalciferol, (DRISDOL) 50000 units CAPS capsule Take 1 capsule (50,000 Units total) by mouth every 7 (seven) days. Patient not taking: Reported on 01/17/2018 10/25/16   Alfonse Spruce, FNP    Allergies    Keflex [cephalexin]  Review of Systems   Review of Systems  Constitutional: Negative for fever.  HENT: Negative for sore throat.   Eyes: Negative for visual disturbance.  Respiratory: Negative for cough and shortness of breath.   Cardiovascular: Negative for chest pain.  Gastrointestinal: Positive for abdominal pain and nausea. Negative for constipation, diarrhea and vomiting.  Genitourinary: Positive for flank pain. Negative for difficulty urinating, vaginal bleeding and vaginal discharge.  Musculoskeletal: Positive for back pain. Negative for neck pain.  Skin: Negative for rash.  Neurological: Negative for syncope and headaches.    Physical Exam Updated Vital Signs BP 104/75 (BP Location: Right Arm)   Pulse 70   Temp 98.3 F (36.8 C) (Oral)   Resp 16   Ht 5\' 3"  (1.6 m)   Wt 79.8 kg   SpO2 99%   BMI 31.18 kg/m   Physical Exam Vitals and nursing note reviewed.  Constitutional:      General: She is not in acute distress.    Appearance: Normal  appearance. She is not ill-appearing, toxic-appearing or diaphoretic.  HENT:     Head: Normocephalic.  Eyes:     Conjunctiva/sclera: Conjunctivae normal.  Cardiovascular:     Rate and Rhythm: Normal rate and regular rhythm.     Pulses: Normal pulses.  Pulmonary:     Effort: Pulmonary effort is normal. No respiratory distress.  Abdominal:     Tenderness: There is abdominal tenderness (right flank). There is right CVA tenderness.     Comments: Negative murphy's sign  Musculoskeletal:        General: No deformity or signs of injury.     Cervical back: No rigidity.  Skin:    General: Skin is warm and dry.     Coloration: Skin is not jaundiced or pale.  Neurological:     General: No focal deficit present.     Mental Status: She is alert and oriented to person, place, and time.     ED Results / Procedures / Treatments   Labs (all labs ordered are listed, but only abnormal results are displayed) Labs Reviewed  URINALYSIS, ROUTINE W REFLEX MICROSCOPIC - Abnormal; Notable for the following components:      Result Value   APPearance HAZY (*)    Hgb urine dipstick SMALL (*)    All other components within normal limits  COMPREHENSIVE METABOLIC PANEL - Abnormal; Notable for the following components:   Glucose, Bld 119 (*)    Total Bilirubin 3.6 (*)    All other components within normal limits  CBC WITH DIFFERENTIAL/PLATELET  I-STAT BETA HCG BLOOD, ED ([facility], [facility], [facility] ONLY)    EKG None  Radiology CT Renal Stone Study  Result Date: 06/10/2020 CLINICAL DATA:  34 year old female with persistent right side flank pain for 2 weeks. EXAM: CT ABDOMEN AND PELVIS WITHOUT CONTRAST TECHNIQUE: Multidetector CT imaging of the abdomen and pelvis was performed following the standard protocol without IV contrast. COMPARISON:  CTA chest 02/11/2010. FINDINGS: Lower chest: Negative; minor atelectasis at the lung bases. Hepatobiliary: Extensive gallstones (coronal image 51). No pericholecystic inflammation.  Negative noncontrast liver. Pancreas: Negative. Spleen: Negative. Adrenals/Urinary Tract: Normal adrenal glands. Negative noncontrast kidneys. No nephrolithiasis. Decompressed proximal ureters. Unremarkable urinary bladder. Stomach/Bowel: Negative noncontrast large bowel including evidence of a normal appendix on coronal image 48. Negative terminal ileum. No dilated small bowel. Negative stomach and duodenum. No free air, free fluid. Vascular/Lymphatic: Normal caliber aorta. Vascular patency is not evaluated in the absence of IV contrast. No lymphadenopathy. Reproductive: IUD in place, and may be twisted perpendicular with respect to the uterine cornua (coronal image 45) but otherwise negative. Other: No pelvic free fluid. Musculoskeletal: Negative. IMPRESSION: 1. Extensive Cholelithiasis. No CT evidence of acute cholecystitis. Right upper quadrant ultrasound would be complementary if symptoms persist. 2. IUD in place, and may be twisted perpendicular to the plane of the uterine cornua. See coronal image 45. 3. No urinary calculus or obstructive uropathy. Electronically Signed   By: Genevie Ann M.D.   On: 06/10/2020 00:05    Procedures Procedures (including critical care time)  Medications Ordered in ED Medications - No data to display  ED Course  I have reviewed the triage vital signs and the nursing notes.  Pertinent labs & imaging results that were available during my care of the patient were reviewed by me and considered in my medical decision making (see chart for details).    MDM Rules/Calculators/A&P                          34yo female presents with concern for waxing and waning right flank pain. DDx includes appendicitis, pancreatitis, cholecystitis, pyelonephritis, nephrolithiasis, diverticulitis, PID, ovarian torsion, ectopic pregnancy   Labs show no evidence of pancreatitis or hepatitis. Bilirubin increased without other changes or abnormalities.  UA without signs of infection.  Denies  fevers. Pregnancy test negative.   CT renal study shows no evidence of nephrolithiasis, does show extensive cholelithiasis. No signs of cholecystitis, no RUQ tenderness on my exam  and doubt cholecystitis. Elements of history are consistent with cholelithiasis. Consider symptomatic cholelithiasis vs MSK back pain as etiology of pain. Reviewed in [place] drug database and given rx for oxycodone to use for pain in addition to flexeril.  Given number for surgery follow up and discussed reasons to return.  Patient discharged in stable condition with understanding of reasons to return.    Final Clinical Impression(s) / ED Diagnoses Final diagnoses:  Calculus of gallbladder without cholecystitis without obstruction  Elevated bilirubin  Right flank pain    Rx / DC Orders ED Discharge Orders         Ordered    cyclobenzaprine (FLEXERIL) 10 MG tablet  2 times daily PRN     Discontinue  Reprint     06/10/20 0058    ondansetron (ZOFRAN ODT) 4 MG disintegrating tablet  Every 8 hours PRN     Discontinue  Reprint     06/10/20 0059    oxyCODONE (ROXICODONE) 5 MG immediate release tablet  Every 4 hours PRN     Discontinue  Reprint     06/10/20 0113           Gareth Morgan, MD 06/10/20 [id number]

## 2020-06-10 NOTE — ED Notes (Signed)
Discharge instructions discussed with pt. Pt verbalized understanding. Pt stable and ambulatory. No signature pad available. 

## 2020-06-16 ENCOUNTER — Encounter: Payer: Self-pay | Admitting: Physician Assistant

## 2020-07-08 ENCOUNTER — Ambulatory Visit: Payer: Self-pay | Admitting: General Surgery

## 2020-07-26 ENCOUNTER — Ambulatory Visit: Payer: Managed Care, Other (non HMO) | Admitting: Registered Nurse

## 2020-07-27 ENCOUNTER — Encounter: Payer: Self-pay | Admitting: Registered Nurse

## 2020-07-28 ENCOUNTER — Telehealth: Payer: Self-pay | Admitting: Nurse Practitioner

## 2020-07-28 ENCOUNTER — Telehealth: Payer: Self-pay | Admitting: General Practice

## 2020-07-28 NOTE — Telephone Encounter (Signed)
Called Debra Allen and asked if she would like to resch her New Debra Allen appt that she No Showed on 07/26/20. Debra Allen hung up. Please advise.

## 2020-07-30 ENCOUNTER — Other Ambulatory Visit: Payer: No Typology Code available for payment source

## 2020-07-30 ENCOUNTER — Other Ambulatory Visit: Payer: Self-pay

## 2020-07-30 DIAGNOSIS — Z20822 Contact with and (suspected) exposure to covid-19: Secondary | ICD-10-CM

## 2020-08-02 LAB — NOVEL CORONAVIRUS, NAA: SARS-CoV-2, NAA: NOT DETECTED

## 2020-08-03 ENCOUNTER — Ambulatory Visit: Payer: No Typology Code available for payment source | Admitting: Physician Assistant

## 2020-08-19 ENCOUNTER — Encounter (HOSPITAL_BASED_OUTPATIENT_CLINIC_OR_DEPARTMENT_OTHER): Payer: Self-pay | Admitting: General Surgery

## 2020-08-19 ENCOUNTER — Other Ambulatory Visit: Payer: Self-pay

## 2020-08-23 ENCOUNTER — Other Ambulatory Visit: Payer: No Typology Code available for payment source

## 2020-08-23 ENCOUNTER — Other Ambulatory Visit (HOSPITAL_COMMUNITY): Payer: No Typology Code available for payment source

## 2020-08-23 NOTE — Progress Notes (Signed)

## 2020-08-24 ENCOUNTER — Other Ambulatory Visit (HOSPITAL_COMMUNITY)
Admission: RE | Admit: 2020-08-24 | Discharge: 2020-08-24 | Disposition: A | Discharge status: Home / Self Care | Payer: No Typology Code available for payment source | Source: Ambulatory Visit | Attending: General Surgery | Admitting: General Surgery

## 2020-08-24 DIAGNOSIS — Z01812 Encounter for preprocedural laboratory examination: Secondary | ICD-10-CM | POA: Diagnosis present

## 2020-08-24 DIAGNOSIS — Z20822 Contact with and (suspected) exposure to covid-19: Secondary | ICD-10-CM | POA: Diagnosis not present

## 2020-08-24 LAB — SARS CORONAVIRUS 2 (TAT 6-24 HRS): SARS Coronavirus 2: NEGATIVE

## 2020-08-25 NOTE — Progress Notes (Signed)
Spoke to patient.  Patient had negative Covid test from 08/24/20.  Her children were tested 08/23/20 and per patient one of her children came back positive for Covid.  Spoke with Abby at Dr. Ethlyn Gallery office.  Case to be rescheduled by Abby.

## 2020-08-26 ENCOUNTER — Other Ambulatory Visit: Payer: No Typology Code available for payment source

## 2020-08-26 DIAGNOSIS — Z20822 Contact with and (suspected) exposure to covid-19: Secondary | ICD-10-CM

## 2020-08-27 ENCOUNTER — Other Ambulatory Visit: Payer: No Typology Code available for payment source

## 2020-08-27 LAB — SARS-COV-2, NAA 2 DAY TAT

## 2020-08-27 LAB — NOVEL CORONAVIRUS, NAA: SARS-CoV-2, NAA: DETECTED — AB

## 2020-08-28 ENCOUNTER — Other Ambulatory Visit: Payer: Self-pay | Admitting: Primary Care

## 2020-08-28 DIAGNOSIS — U071 COVID-19: Secondary | ICD-10-CM

## 2020-08-28 NOTE — Progress Notes (Signed)
I connected by phone with [name] on 08/28/2020 at 10:37 AM to discuss the potential use of a new treatment for mild to moderate COVID-19 viral infection in non-hospitalized patients.  This patient is a 34 y.o. female that meets the FDA criteria for Emergency Use Authorization of COVID monoclonal antibody casirivimab/imdevimab or bamlanivimab/eteseviamb.  Has a (+) direct SARS-CoV-2 viral test result  Has mild or moderate COVID-19   Is NOT hospitalized due to COVID-19  Is within 10 days of symptom onset  Has at least one of the high risk factor(s) for progression to severe COVID-19 and/or hospitalization as defined in EUA.  Specific high risk criteria : BMI > 25   I have spoken and communicated the following to the patient or parent/caregiver regarding COVID monoclonal antibody treatment:  1. FDA has authorized the emergency use for the treatment of mild to moderate COVID-19 in adults and pediatric patients with positive results of direct SARS-CoV-2 viral testing who are 24 years of age and older weighing at least 40 kg, and who are at high risk for progressing to severe COVID-19 and/or hospitalization.  2. The significant known and potential risks and benefits of COVID monoclonal antibody, and the extent to which such potential risks and benefits are unknown.  3. Information on available alternative treatments and the risks and benefits of those alternatives, including clinical trials.  4. Patients treated with COVID monoclonal antibody should continue to self-isolate and use infection control measures (e.g., wear mask, isolate, social distance, avoid sharing personal items, clean and disinfect high touch surfaces, and frequent handwashing) according to CDC guidelines.   5. The patient or parent/caregiver has the option to accept or refuse COVID monoclonal antibody treatment.  After reviewing this information with the patient, the patient has agreed to receive one of  the available covid 19 monoclonal antibodies and will be provided an appropriate fact sheet prior to infusion. Pleas Koch, NP 08/28/2020 10:37 AM

## 2020-08-29 ENCOUNTER — Ambulatory Visit (HOSPITAL_COMMUNITY)
Admission: RE | Admit: 2020-08-29 | Discharge: 2020-08-29 | Disposition: A | Discharge status: Home / Self Care | Payer: No Typology Code available for payment source | Source: Ambulatory Visit | Attending: Pulmonary Disease | Admitting: Pulmonary Disease

## 2020-08-29 DIAGNOSIS — U071 COVID-19: Secondary | ICD-10-CM | POA: Insufficient documentation

## 2020-08-29 MED ORDER — FAMOTIDINE IN NACL 20-0.9 MG/50ML-% IV SOLN: 20.0000 mg | Freq: Once | INTRAVENOUS | Status: DC | PRN

## 2020-08-29 MED ORDER — ALBUTEROL SULFATE HFA 108 (90 BASE) MCG/ACT IN AERS: 2.0000 | INHALATION_SPRAY | Freq: Once | RESPIRATORY_TRACT | Status: DC | PRN

## 2020-08-29 MED ORDER — SODIUM CHLORIDE 0.9 % IV SOLN: INTRAVENOUS | Status: DC | PRN

## 2020-08-29 MED ORDER — EPINEPHRINE 0.3 MG/0.3ML IJ SOAJ: 0.3000 mg | Freq: Once | INTRAMUSCULAR | Status: DC | PRN

## 2020-08-29 MED ORDER — METHYLPREDNISOLONE SODIUM SUCC 125 MG IJ SOLR: 125.0000 mg | Freq: Once | INTRAMUSCULAR | Status: DC | PRN

## 2020-08-29 MED ORDER — SODIUM CHLORIDE 0.9 % IV SOLN: Freq: Once | INTRAVENOUS | Status: AC

## 2020-08-29 MED ORDER — DIPHENHYDRAMINE HCL 50 MG/ML IJ SOLN: 50.0000 mg | Freq: Once | INTRAMUSCULAR | Status: DC | PRN

## 2020-08-29 NOTE — Discharge Instructions (Signed)

## 2020-08-29 NOTE — Progress Notes (Addendum)
  Diagnosis: COVID-19  Physician: Dr. Joya Gaskins  Procedure: Covid Infusion Clinic Med: bamlanivimab\etesevimab infusion - Provided patient with bamlanimivab\etesevimab fact sheet for patients, parents and caregivers prior to infusion.  Complications:  Patient was nauseated and felt hot but  No immediate complications noted.Patient VS stable and was wheeled out with NT.   Discharge: Discharged home   Debra Allen 08/29/2020

## 2020-09-04 ENCOUNTER — Other Ambulatory Visit: Payer: No Typology Code available for payment source

## 2020-09-04 DIAGNOSIS — Z20822 Contact with and (suspected) exposure to covid-19: Secondary | ICD-10-CM

## 2020-09-05 LAB — NOVEL CORONAVIRUS, NAA: SARS-CoV-2, NAA: NOT DETECTED

## 2020-09-05 LAB — SARS-COV-2, NAA 2 DAY TAT

## 2020-09-20 NOTE — Progress Notes (Signed)
Pt not tested for covid due to pt testing + for covid on 08/27/20. Results found in Epic. Based on the guidelines the pt is in the 90 day window to not retest. The pt is still expected to quarantine until their procedure. Therefore, the pt can still have the scheduled procedure. These are the guidelines as follows:  Guidance: Patient previously tested + COVID; now past 90 day window seeking elective surgery (asymptomatic)  Retest patient If negative, proceed with surgery If positive, postpone surgery for 10 days from positive test Patient to quarantine for the (10 days) Do not retest again prior to surgery (even if scheduled a couple of weeks out) Use standard precautions for surgery   Jacqlyn Larsen, RN

## 2020-09-21 ENCOUNTER — Inpatient Hospital Stay (HOSPITAL_COMMUNITY)
Admission: RE | Admit: 2020-09-21 | Discharge: 2020-09-21 | Disposition: A | Discharge status: Home / Self Care | Payer: No Typology Code available for payment source | Source: Ambulatory Visit

## 2020-09-23 ENCOUNTER — Encounter (HOSPITAL_COMMUNITY): Payer: Self-pay | Admitting: General Surgery

## 2020-09-23 ENCOUNTER — Other Ambulatory Visit: Payer: Self-pay

## 2020-09-23 NOTE — Progress Notes (Addendum)
Mrs Debra Allen denies chest pain or shortness of breath.  Patient was positive for Covid 08/27/20 and does not need to be tested.

## 2020-09-24 ENCOUNTER — Encounter (HOSPITAL_COMMUNITY): Payer: Self-pay | Admitting: General Surgery

## 2020-09-24 ENCOUNTER — Ambulatory Visit (HOSPITAL_COMMUNITY): Payer: No Typology Code available for payment source

## 2020-09-24 ENCOUNTER — Ambulatory Visit (HOSPITAL_COMMUNITY)
Admission: RE | Admit: 2020-09-24 | Discharge: 2020-09-25 | Disposition: A | Discharge status: Home / Self Care | Payer: No Typology Code available for payment source | Attending: General Surgery | Admitting: General Surgery

## 2020-09-24 ENCOUNTER — Encounter (HOSPITAL_COMMUNITY)
Admission: RE | Disposition: A | Discharge status: Home / Self Care | Payer: Self-pay | Source: Home / Self Care | Attending: General Surgery

## 2020-09-24 ENCOUNTER — Ambulatory Visit (HOSPITAL_COMMUNITY): Payer: No Typology Code available for payment source | Admitting: Certified Registered"

## 2020-09-24 ENCOUNTER — Other Ambulatory Visit: Payer: Self-pay

## 2020-09-24 DIAGNOSIS — K8010 Calculus of gallbladder with chronic cholecystitis without obstruction: Secondary | ICD-10-CM | POA: Diagnosis not present

## 2020-09-24 DIAGNOSIS — Z881 Allergy status to other antibiotic agents status: Secondary | ICD-10-CM | POA: Insufficient documentation

## 2020-09-24 DIAGNOSIS — K8020 Calculus of gallbladder without cholecystitis without obstruction: Secondary | ICD-10-CM | POA: Diagnosis present

## 2020-09-24 DIAGNOSIS — Z419 Encounter for procedure for purposes other than remedying health state, unspecified: Secondary | ICD-10-CM

## 2020-09-24 HISTORY — DX: Anxiety disorder, unspecified: F41.9

## 2020-09-24 HISTORY — PX: CHOLECYSTECTOMY: SHX55

## 2020-09-24 HISTORY — DX: Calculus of gallbladder without cholecystitis without obstruction: K80.20

## 2020-09-24 LAB — CBC
HCT: 38.6 % (ref 36.0–46.0)
Hemoglobin: 12.9 g/dL (ref 12.0–15.0)
MCH: 30 pg (ref 26.0–34.0)
MCHC: 33.4 g/dL (ref 30.0–36.0)
MCV: 89.8 fL (ref 80.0–100.0)
Platelets: 211 10*3/uL (ref 150–400)
RBC: 4.3 MIL/uL (ref 3.87–5.11)
RDW: 12.6 % (ref 11.5–15.5)
WBC: 7.4 10*3/uL (ref 4.0–10.5)
nRBC: 0 % (ref 0.0–0.2)

## 2020-09-24 LAB — POCT PREGNANCY, URINE: Preg Test, Ur: NEGATIVE

## 2020-09-24 SURGERY — LAPAROSCOPIC CHOLECYSTECTOMY WITH INTRAOPERATIVE CHOLANGIOGRAM
Anesthesia: General | Site: Abdomen

## 2020-09-24 MED ORDER — MIDAZOLAM HCL 2 MG/2ML IJ SOLN
INTRAMUSCULAR | Status: AC
  Filled 2020-09-24: qty 2

## 2020-09-24 MED ORDER — HYDROCODONE-ACETAMINOPHEN 5-325 MG PO TABS: 1.0000 | ORAL_TABLET | Freq: Four times a day (QID) | ORAL | 0 refills | Status: DC | PRN

## 2020-09-24 MED ORDER — ROCURONIUM 10MG/ML (10ML) SYRINGE FOR MEDFUSION PUMP - OPTIME
INTRAVENOUS | Status: DC | PRN
  Administered 2020-09-24: 50 mg via INTRAVENOUS

## 2020-09-24 MED ORDER — PROPOFOL 10 MG/ML IV BOLUS
INTRAVENOUS | Status: DC | PRN
  Administered 2020-09-24: 150 mg via INTRAVENOUS

## 2020-09-24 MED ORDER — LIDOCAINE-EPINEPHRINE 1 %-1:100000 IJ SOLN
INTRAMUSCULAR | Status: AC
  Filled 2020-09-24: qty 1

## 2020-09-24 MED ORDER — ONDANSETRON HCL 4 MG/2ML IJ SOLN: 4.0000 mg | Freq: Four times a day (QID) | INTRAMUSCULAR | Status: DC | PRN

## 2020-09-24 MED ORDER — SUGAMMADEX SODIUM 200 MG/2ML IV SOLN
INTRAVENOUS | Status: DC | PRN
  Administered 2020-09-24: 200 mg via INTRAVENOUS

## 2020-09-24 MED ORDER — CHLORHEXIDINE GLUCONATE 0.12 % MT SOLN: 15.0000 mL | Freq: Once | OROMUCOSAL | Status: AC

## 2020-09-24 MED ORDER — ACETAMINOPHEN 500 MG PO TABS
1000.0000 mg | ORAL_TABLET | ORAL | Status: AC
  Administered 2020-09-24: 1000 mg via ORAL
  Filled 2020-09-24: qty 2

## 2020-09-24 MED ORDER — FENTANYL CITRATE (PF) 100 MCG/2ML IJ SOLN
INTRAMUSCULAR | Status: AC
  Filled 2020-09-24: qty 2

## 2020-09-24 MED ORDER — FENTANYL CITRATE (PF) 100 MCG/2ML IJ SOLN
25.0000 ug | INTRAMUSCULAR | Status: DC | PRN
  Administered 2020-09-24: 25 ug via INTRAVENOUS
  Administered 2020-09-24: 50 ug via INTRAVENOUS

## 2020-09-24 MED ORDER — LIDOCAINE-EPINEPHRINE 1 %-1:100000 IJ SOLN
INTRAMUSCULAR | Status: DC | PRN
  Administered 2020-09-24: 20 mL

## 2020-09-24 MED ORDER — ORAL CARE MOUTH RINSE: 15.0000 mL | Freq: Once | OROMUCOSAL | Status: AC

## 2020-09-24 MED ORDER — CHLORHEXIDINE GLUCONATE 0.12 % MT SOLN
OROMUCOSAL | Status: AC
  Administered 2020-09-24: 15 mL via OROMUCOSAL
  Filled 2020-09-24: qty 15

## 2020-09-24 MED ORDER — LIDOCAINE HCL (CARDIAC) PF 100 MG/5ML IV SOSY
PREFILLED_SYRINGE | INTRAVENOUS | Status: DC | PRN
  Administered 2020-09-24: 100 mg via INTRAVENOUS

## 2020-09-24 MED ORDER — DEXAMETHASONE SODIUM PHOSPHATE 10 MG/ML IJ SOLN
INTRAMUSCULAR | Status: DC | PRN
  Administered 2020-09-24: 10 mg via INTRAVENOUS

## 2020-09-24 MED ORDER — LACTATED RINGERS IV SOLN: INTRAVENOUS | Status: DC | PRN

## 2020-09-24 MED ORDER — LACTATED RINGERS IV SOLN: INTRAVENOUS | Status: DC

## 2020-09-24 MED ORDER — KCL IN DEXTROSE-NACL 20-5-0.9 MEQ/L-%-% IV SOLN
INTRAVENOUS | Status: DC
  Filled 2020-09-24 (×3): qty 1000

## 2020-09-24 MED ORDER — SUFENTANIL CITRATE 50 MCG/ML IV SOLN
INTRAVENOUS | Status: DC | PRN
  Administered 2020-09-24 (×5): 10 ug via INTRAVENOUS

## 2020-09-24 MED ORDER — ONDANSETRON 4 MG PO TBDP: 4.0000 mg | ORAL_TABLET | Freq: Four times a day (QID) | ORAL | Status: DC | PRN

## 2020-09-24 MED ORDER — HEMOSTATIC AGENTS (NO CHARGE) OPTIME
TOPICAL | Status: DC | PRN
  Administered 2020-09-24: 1 via TOPICAL

## 2020-09-24 MED ORDER — 0.9 % SODIUM CHLORIDE (POUR BTL) OPTIME
TOPICAL | Status: DC | PRN
  Administered 2020-09-24: 2000 mL

## 2020-09-24 MED ORDER — CHLORHEXIDINE GLUCONATE CLOTH 2 % EX PADS: 6.0000 | MEDICATED_PAD | Freq: Once | CUTANEOUS | Status: DC

## 2020-09-24 MED ORDER — SUFENTANIL CITRATE 50 MCG/ML IV SOLN
INTRAVENOUS | Status: AC
  Filled 2020-09-24: qty 1

## 2020-09-24 MED ORDER — AMISULPRIDE (ANTIEMETIC) 5 MG/2ML IV SOLN
10.0000 mg | Freq: Once | INTRAVENOUS | Status: AC | PRN
  Administered 2020-09-24: 10 mg via INTRAVENOUS

## 2020-09-24 MED ORDER — PANTOPRAZOLE SODIUM 40 MG IV SOLR
40.0000 mg | Freq: Every day | INTRAVENOUS | Status: DC
  Administered 2020-09-24: 40 mg via INTRAVENOUS
  Filled 2020-09-24: qty 40

## 2020-09-24 MED ORDER — PROPOFOL 10 MG/ML IV BOLUS
INTRAVENOUS | Status: AC
  Filled 2020-09-24: qty 20

## 2020-09-24 MED ORDER — SODIUM CHLORIDE 0.9 % IV SOLN
INTRAVENOUS | Status: DC | PRN
  Administered 2020-09-24: 50 mL

## 2020-09-24 MED ORDER — MORPHINE SULFATE (PF) 2 MG/ML IV SOLN
1.0000 mg | INTRAVENOUS | Status: DC | PRN
  Administered 2020-09-24 (×2): 2 mg via INTRAVENOUS
  Filled 2020-09-24 (×2): qty 1

## 2020-09-24 MED ORDER — CELECOXIB 200 MG PO CAPS
200.0000 mg | ORAL_CAPSULE | ORAL | Status: AC
  Administered 2020-09-24: 200 mg via ORAL
  Filled 2020-09-24: qty 1

## 2020-09-24 MED ORDER — SODIUM CHLORIDE 0.9 % IR SOLN
Status: DC | PRN
  Administered 2020-09-24: 1000 mL

## 2020-09-24 MED ORDER — MIDAZOLAM HCL 2 MG/2ML IJ SOLN
INTRAMUSCULAR | Status: DC | PRN
  Administered 2020-09-24: 2 mg via INTRAVENOUS

## 2020-09-24 MED ORDER — CIPROFLOXACIN IN D5W 400 MG/200ML IV SOLN
400.0000 mg | INTRAVENOUS | Status: AC
  Administered 2020-09-24: 400 mg via INTRAVENOUS
  Filled 2020-09-24: qty 200

## 2020-09-24 MED ORDER — HYDROCODONE-ACETAMINOPHEN 5-325 MG PO TABS
1.0000 | ORAL_TABLET | ORAL | Status: DC | PRN
  Administered 2020-09-24 – 2020-09-25 (×4): 2 via ORAL
  Filled 2020-09-24 (×5): qty 2

## 2020-09-24 MED ORDER — GABAPENTIN 300 MG PO CAPS
300.0000 mg | ORAL_CAPSULE | ORAL | Status: AC
  Administered 2020-09-24: 300 mg via ORAL
  Filled 2020-09-24: qty 1

## 2020-09-24 SURGICAL SUPPLY — 42 items
APPLIER CLIP 5 13 M/L LIGAMAX5 (MISCELLANEOUS) ×2
BLADE CLIPPER SURG (BLADE) IMPLANT
CANISTER SUCT 3000ML PPV (MISCELLANEOUS) ×2 IMPLANT
CATH REDDICK CHOLANGI 4FR 50CM (CATHETERS) IMPLANT
CHLORAPREP W/TINT 26 (MISCELLANEOUS) ×2 IMPLANT
CLIP APPLIE 5 13 M/L LIGAMAX5 (MISCELLANEOUS) ×1 IMPLANT
CNTNR URN SCR LID CUP LEK RST (MISCELLANEOUS) ×1 IMPLANT
CONT SPEC 4OZ STRL OR WHT (MISCELLANEOUS) ×2
COVER MAYO STAND STRL (DRAPES) IMPLANT
COVER SURGICAL LIGHT HANDLE (MISCELLANEOUS) ×2 IMPLANT
COVER WAND RF STERILE (DRAPES) IMPLANT
DERMABOND ADVANCED (GAUZE/BANDAGES/DRESSINGS) ×1
DERMABOND ADVANCED .7 DNX12 (GAUZE/BANDAGES/DRESSINGS) ×1 IMPLANT
DRAPE C-ARM 42X120 X-RAY (DRAPES) IMPLANT
DRAPE WARM FLUID 44X44 (DRAPES) ×2 IMPLANT
ELECT REM PT RETURN 9FT ADLT (ELECTROSURGICAL) ×2
ELECTRODE REM PT RTRN 9FT ADLT (ELECTROSURGICAL) ×1 IMPLANT
GLOVE BIO SURGEON STRL SZ7.5 (GLOVE) ×2 IMPLANT
GLOVE BIOGEL PI IND STRL 6 (GLOVE) ×1 IMPLANT
GLOVE BIOGEL PI INDICATOR 6 (GLOVE) ×1
GOWN STRL REUS W/ TWL LRG LVL3 (GOWN DISPOSABLE) ×5 IMPLANT
GOWN STRL REUS W/TWL LRG LVL3 (GOWN DISPOSABLE) ×10
IV CATH 14GX2 1/4 (CATHETERS) ×2 IMPLANT
KIT BASIN OR (CUSTOM PROCEDURE TRAY) ×2 IMPLANT
KIT TURNOVER KIT B (KITS) ×2 IMPLANT
NS IRRIG 1000ML POUR BTL (IV SOLUTION) ×4 IMPLANT
PAD ARMBOARD 7.5X6 YLW CONV (MISCELLANEOUS) ×2 IMPLANT
PENCIL BUTTON HOLSTER BLD 10FT (ELECTRODE) ×2 IMPLANT
POUCH SPECIMEN RETRIEVAL 10MM (ENDOMECHANICALS) ×2 IMPLANT
SCISSORS LAP 5X35 DISP (ENDOMECHANICALS) ×2 IMPLANT
SET IRRIG TUBING LAPAROSCOPIC (IRRIGATION / IRRIGATOR) ×2 IMPLANT
SET TUBE SMOKE EVAC HIGH FLOW (TUBING) ×2 IMPLANT
SLEEVE ENDOPATH XCEL 5M (ENDOMECHANICALS) ×4 IMPLANT
SPECIMEN JAR SMALL (MISCELLANEOUS) IMPLANT
SUT MNCRL AB 4-0 PS2 18 (SUTURE) ×2 IMPLANT
SYR BULB IRRIG 60ML STRL (SYRINGE) ×2 IMPLANT
TOWEL GREEN STERILE (TOWEL DISPOSABLE) ×2 IMPLANT
TOWEL GREEN STERILE FF (TOWEL DISPOSABLE) IMPLANT
TRAY LAPAROSCOPIC MC (CUSTOM PROCEDURE TRAY) ×2 IMPLANT
TROCAR XCEL BLUNT TIP 100MML (ENDOMECHANICALS) ×2 IMPLANT
TROCAR XCEL NON-BLD 5MMX100MML (ENDOMECHANICALS) ×2 IMPLANT
WATER STERILE IRR 1000ML POUR (IV SOLUTION) IMPLANT

## 2020-09-24 NOTE — H&P (Signed)
Debra Allen  Location: [facility] Patient #: [id number] DOB: 01/28/1986 Single / Language: Debra Allen / Race: Refused to Report/Unreported Female   History of Present Illness The patient is a 34 year old female who presents with abdominal pain. We are asked to see the patient in consultation by Dr. Boykin Nearing to evaluate her for gallstones. The patient is a 34 year old Hispanic female who has been having intermittent right upper quadrant pain for the last 5 years. She had a recent episode that was severe where she went to the emergency department. She has had some nausea but no vomiting associated with it. The pain seems to radiate into her back. A CT scan did show multiple stones in the gallbladder but no gallbladder wall thickening or ductal dilatation. She did have some slight elevation of her total bilirubin but the other liver functions were normal.   Past Surgical History  No pertinent past surgical history   Diagnostic Studies History  Colonoscopy  never  Allergies  Keflex *CEPHALOSPORINS*  Itching, Rash. Allergies Reconciled   Medication History  Tylenol (325MG  Tablet, Oral as needed) Active. Ferrous Sulfate (325 (65 Fe)MG Tablet DR, Oral) Active. Ibuprofen (600MG  Tablet, Oral as needed) Active. Multi Vitamin Daily (Oral) Active. Vitamin D3 (5000UNIT Tablet, Oral) Active. Medications Reconciled  Social History  Alcohol use  Occasional alcohol use. Caffeine use  Coffee, Tea. No drug use  Tobacco use  Never smoker.  Family History  Alcohol Abuse  Father. Arthritis  Father, Mother. Bleeding disorder  Father, Mother. Hypertension  Mother.  Pregnancy / Birth History  Age at menarche  32 years. Contraceptive History  Intrauterine device. Gravida  6 5 Irregular periods  Length (months) of breastfeeding  3-6 Maternal age  66-20 Para  4    Review of Systems General Not Present- Appetite Loss, Chills,  Fatigue, Fever, Night Sweats, Weight Gain and Weight Loss. HEENT Present- Wears glasses/contact lenses. Not Present- Earache, Hearing Loss, Hoarseness, Nose Bleed, Oral Ulcers, Ringing in the Ears, Seasonal Allergies, Sinus Pain, Sore Throat, Visual Disturbances and Yellow Eyes. Respiratory Not Present- Bloody sputum, Chronic Cough, Difficulty Breathing, Snoring and Wheezing. Breast Not Present- Breast Mass, Breast Pain, Nipple Discharge and Skin Changes. Gastrointestinal Present- Abdominal Pain, Bloating, Bloody Stool, Excessive gas, Indigestion and Nausea. Not Present- Change in Bowel Habits, Chronic diarrhea, Constipation, Difficulty Swallowing, Gets full quickly at meals, Hemorrhoids, Rectal Pain and Vomiting. Female Genitourinary Present- Pelvic Pain and Urgency. Not Present- Frequency, Nocturia and Painful Urination. Endocrine Not Present- Cold Intolerance, Excessive Hunger, Hair Changes, Heat Intolerance, Hot flashes and New Diabetes.  Vitals  Weight: 179.4 lb Height: 63in Body Surface Area: 1.85 m Body Mass Index: 31.78 kg/m  Temp.: 98.60F (Temporal)  Pulse: 91 (Regular)  P.OX: 99% (Room air) BP: 112/78(Sitting, Left Arm, Standard)       Physical Exam General Mental Status-Alert. General Appearance-Consistent with stated age. Hydration-Well hydrated. Voice-Normal.  Head and Neck Head-normocephalic, atraumatic with no lesions or palpable masses. Trachea-midline. Thyroid Gland Characteristics - normal size and consistency.  Eye Eyeball - Bilateral-Extraocular movements intact. Sclera/Conjunctiva - Bilateral-No scleral icterus.  Chest and Lung Exam Chest and lung exam reveals -quiet, even and easy respiratory effort with no use of accessory muscles and on auscultation, normal breath sounds, no adventitious sounds and normal vocal resonance. Inspection Chest Wall - Normal. Back - normal.  Cardiovascular Cardiovascular examination reveals  -normal heart sounds, regular rate and rhythm with no murmurs and normal pedal pulses bilaterally.  Abdomen Inspection Inspection of the abdomen  reveals - No Hernias. Skin - Scar - no surgical scars. Palpation/Percussion Palpation and Percussion of the abdomen reveal - Soft, Non Tender, No Rebound tenderness, No Rigidity (guarding) and No hepatosplenomegaly. Auscultation Auscultation of the abdomen reveals - Bowel sounds normal.  Neurologic Neurologic evaluation reveals -alert and oriented x 3 with no impairment of recent or remote memory. Mental Status-Normal.  Musculoskeletal Normal Exam - Left-Upper Extremity Strength Normal and Lower Extremity Strength Normal. Normal Exam - Right-Upper Extremity Strength Normal and Lower Extremity Strength Normal.  Lymphatic Head & Neck  General Head & Neck Lymphatics: Bilateral - Description - Normal. Axillary  General Axillary Region: Bilateral - Description - Normal. Tenderness - Non Tender. Femoral & Inguinal  Generalized Femoral & Inguinal Lymphatics: Bilateral - Description - Normal. Tenderness - Non Tender.    Assessment & Plan  GALLSTONES (K80.20) Impression: The patient appears to have symptomatic gallstones. Because of the risk of further painful episodes and possible pancreatitis of think she would benefit from having her gallbladder removed. She would also like to have this done. I have discussed with her in detail the risks and benefits of the operation to remove the gallbladder as well as some of the technical aspects including the risk of common duct injury and she understands and wishes to proceed. This patient encounter took 30 minutes today to perform the following: take history, perform exam, review outside records, interpret imaging, counsel the patient on their diagnosis and document encounter, findings & plan in the EHR

## 2020-09-24 NOTE — Anesthesia Preprocedure Evaluation (Signed)
Anesthesia Evaluation  Patient identified by MRN, date of birth, ID band Patient awake    Reviewed: Allergy & Precautions, NPO status , Patient's Chart, lab work & pertinent test results  Airway Mallampati: II  TM Distance: >3 FB Neck ROM: Full    Dental  (+) Dental Advisory Given   Pulmonary neg pulmonary ROS,    breath sounds clear to auscultation       Cardiovascular negative cardio ROS   Rhythm:Regular Rate:Normal     Neuro/Psych negative neurological ROS     GI/Hepatic negative GI ROS, Neg liver ROS,   Endo/Other  negative endocrine ROS  Renal/GU negative Renal ROS     Musculoskeletal   Abdominal   Peds  Hematology  (+) anemia ,   Anesthesia Other Findings   Reproductive/Obstetrics                            Anesthesia Physical Anesthesia Plan  ASA: II  Anesthesia Plan: General   Post-op Pain Management:    Induction: Intravenous  PONV Risk Score and Plan: 3 and Midazolam, Dexamethasone, Ondansetron and Treatment may vary due to age or medical condition  Airway Management Planned: Oral ETT  Additional Equipment:   Intra-op Plan:   Post-operative Plan: Extubation in OR  Informed Consent: I have reviewed the patients History and Physical, chart, labs and discussed the procedure including the risks, benefits and alternatives for the proposed anesthesia with the patient or authorized representative who has indicated his/her understanding and acceptance.     Dental advisory given  Plan Discussed with: CRNA  Anesthesia Plan Comments:         Anesthesia Quick Evaluation

## 2020-09-24 NOTE — Anesthesia Procedure Notes (Signed)
Procedure Name: Intubation Date/Time: 09/24/2020 12:24 PM Performed by: Claris Che, CRNA Pre-anesthesia Checklist: Patient identified, Emergency Drugs available, Suction available, Patient being monitored and Timeout performed Patient Re-evaluated:Patient Re-evaluated prior to induction Oxygen Delivery Method: Circle system utilized Preoxygenation: Pre-oxygenation with 100% oxygen Induction Type: IV induction and Cricoid Pressure applied Ventilation: Mask ventilation without difficulty Laryngoscope Size: Mac and 3 Grade View: Grade II Tube size: 7.5 mm Number of attempts: 1 Airway Equipment and Method: Stylet Placement Confirmation: ETT inserted through vocal cords under direct vision,  positive ETCO2 and breath sounds checked- equal and bilateral Secured at: 22 cm Tube secured with: Tape Dental Injury: Teeth and Oropharynx as per pre-operative assessment

## 2020-09-24 NOTE — Transfer of Care (Signed)
Immediate Anesthesia Transfer of Care Note  Patient: Debra Allen  Procedure(s) Performed: LAPAROSCOPIC CHOLECYSTECTOMY (N/A Abdomen)  Patient Location: PACU  Anesthesia Type:General  Level of Consciousness: drowsy, patient cooperative and responds to stimulation  Airway & Oxygen Therapy: Patient Spontanous Breathing and Patient connected to nasal cannula oxygen  Post-op Assessment: Report given to RN, Post -op Vital signs reviewed and stable and Patient moving all extremities X 4  Post vital signs: Reviewed and stable  Last Vitals:  Vitals Value Taken Time  BP 125/80 09/24/20 1430  Temp    Pulse 101 09/24/20 1431  Resp 10 09/24/20 1431  SpO2 100 % 09/24/20 1431  Vitals shown include unvalidated device data.  Last Pain:  Vitals:   09/24/20 0832  TempSrc: Oral  PainSc: 6       Patients Stated Pain Goal: 5 (32/44/01 0272)  Complications: No complications documented.

## 2020-09-24 NOTE — Op Note (Signed)
09/24/2020  2:08 PM  PATIENT:  Debra Allen  34 y.o. female  PRE-OPERATIVE DIAGNOSIS:  GALLSTONES  POST-OPERATIVE DIAGNOSIS:  GALLSTONES  PROCEDURE:  Procedure(s): LAPAROSCOPIC CHOLECYSTECTOMY (N/A)  SURGEON:  Surgeon(s) and Role:    * Jovita Kussmaul, MD - Primary  PHYSICIAN ASSISTANT:   ASSISTANTS: Dr. Jamelle Rushing   ANESTHESIA:   local and general  EBL:  3 mL   BLOOD ADMINISTERED:none  DRAINS: none   LOCAL MEDICATIONS USED:  MARCAINE     SPECIMEN:  Source of Specimen:  gallbladder  DISPOSITION OF SPECIMEN:  PATHOLOGY  COUNTS:  YES  TOURNIQUET:  * No tourniquets in log *  DICTATION: .Dragon Dictation     Procedure: After informed consent was obtained the patient was brought to the operating room and placed in the supine position on the operating room table. After adequate induction of general anesthesia the patient's abdomen was prepped with ChloraPrep allowed to dry and draped in usual sterile manner. An appropriate timeout was performed. The area below the umbilicus was infiltrated with quarter percent  Marcaine. A small incision was made with a 15 blade knife. The incision was carried down through the subcutaneous tissue bluntly with a hemostat and [employer] retractors. The linea alba was identified. The linea alba was incised with a 15 blade knife and each side was grasped with Coker clamps. The preperitoneal space was then probed with a hemostat until the peritoneum was opened and access was gained to the abdominal cavity. A 0 Vicryl pursestring stitch was placed in the fascia surrounding the opening. A Hassan cannula was then placed through the opening and anchored in place with the previously placed Vicryl purse string stitch. The abdomen was insufflated with carbon dioxide without difficulty. A laparoscope was inserted through the [facility] cannula in the right upper quadrant was inspected. Next the epigastric region was infiltrated with % Marcaine. A small  incision was made with a 15 blade knife. A 5 mm port was placed bluntly through this incision into the abdominal cavity under direct vision. Next 2 sites were chosen laterally on the right side of the abdomen for placement of 5 mm ports. Each of these areas was infiltrated with quarter percent Marcaine. Small stab incisions were made with a 15 blade knife. 5 mm ports were then placed bluntly through these incisions into the abdominal cavity under direct vision without difficulty. A blunt grasper was placed through the lateralmost 5 mm port and used to grasp the dome of the gallbladder and elevated anteriorly and superiorly. Another blunt grasper was placed through the other 5 mm port and used to retract the body and neck of the gallbladder. A dissector was placed through the epigastric port and using the electrocautery the peritoneal reflection at the gallbladder neck was opened. Blunt dissection was then carried out in this area until the gallbladder neck-cystic duct junction was readily identified and a good window was created.The cystic duct appeared very short and there did not appear to be enough room to safely get a cholangiogram. The dissection was kept very close to the gallbladder wall and we felt comfortable with the location of the common duct. Because of this we decided to forego the cholangiogram. 2 clips were placed proximally on the cystic duct and one distally and the duct was divided between the 2 sets of clips. Posterior to this the cystic artery was identified and again dissected bluntly in a circumferential manner until a good window  was created. 2 clips were placed proximally and  one distally on the artery and the artery was divided between the 2 sets of clips. Next a laparoscopic hook cautery device was used to separate the gallbladder from the liver bed. Prior to completely detaching the gallbladder from the liver bed the liver bed was inspected and several small bleeding points were  coagulated with the electrocautery until the area was completely hemostatic. There appeared to be a small soft hematoma of the liver in the gallbladder bed.The gallbladder was then detached the rest of it from the liver bed without difficulty. The liver bed was covered with a piece of surgicel. A laparoscopic bag was inserted through the hassan port. The laparoscope was moved to the epigastric port. The gallbladder was placed within the bag and the bag was sealed.  The bag with the gallbladder was then removed with the [facility] cannula through the infraumbilical port without difficulty. The fascial defect was then closed with the previously placed Vicryl pursestring stitch as well as with another figure-of-eight 0 Vicryl stitch. The liver bed was inspected again and found to be hemostatic. The abdomen was irrigated with copious amounts of saline until the effluent was clear. The ports were then removed under direct vision without difficulty and were found to be hemostatic. The gas was allowed to escape. The skin incisions were all closed with interrupted 4-0 Monocryl subcuticular stitches. Dermabond dressings were applied. The patient tolerated the procedure well. At the end of the case all needle sponge and instrument counts were correct. The patient was then awakened and taken to recovery in stable condition  PLAN OF CARE: Admit for overnight observation  PATIENT DISPOSITION:  PACU - hemodynamically stable.   Delay start of Pharmacological VTE agent (>24hrs) due to surgical blood loss or risk of bleeding: not applicable

## 2020-09-25 DIAGNOSIS — K8010 Calculus of gallbladder with chronic cholecystitis without obstruction: Secondary | ICD-10-CM | POA: Diagnosis not present

## 2020-09-25 LAB — COMPREHENSIVE METABOLIC PANEL
ALT: 160 U/L — ABNORMAL HIGH (ref 0–44)
AST: 121 U/L — ABNORMAL HIGH (ref 15–41)
Albumin: 3.7 g/dL (ref 3.5–5.0)
Alkaline Phosphatase: 49 U/L (ref 38–126)
Anion gap: 10 (ref 5–15)
BUN: 6 mg/dL (ref 6–20)
CO2: 22 mmol/L (ref 22–32)
Calcium: 9.1 mg/dL (ref 8.9–10.3)
Chloride: 102 mmol/L (ref 98–111)
Creatinine, Ser: 0.5 mg/dL (ref 0.44–1.00)
GFR, Estimated: >60 mL/min (ref >60)
Glucose, Bld: 138 mg/dL — ABNORMAL HIGH (ref 70–99)
Potassium: 3.8 mmol/L (ref 3.5–5.1)
Sodium: 134 mmol/L — ABNORMAL LOW (ref 135–145)
Total Bilirubin: 1.9 mg/dL — ABNORMAL HIGH (ref 0.3–1.2)
Total Protein: 6.4 g/dL — ABNORMAL LOW (ref 6.5–8.1)

## 2020-09-25 LAB — CBC
HCT: 33.9 % — ABNORMAL LOW (ref 36.0–46.0)
Hemoglobin: 11.7 g/dL — ABNORMAL LOW (ref 12.0–15.0)
MCH: 30.6 pg (ref 26.0–34.0)
MCHC: 34.5 g/dL (ref 30.0–36.0)
MCV: 88.7 fL (ref 80.0–100.0)
Platelets: 206 10*3/uL (ref 150–400)
RBC: 3.82 MIL/uL — ABNORMAL LOW (ref 3.87–5.11)
RDW: 12.6 % (ref 11.5–15.5)
WBC: 14.7 10*3/uL — ABNORMAL HIGH (ref 4.0–10.5)
nRBC: 0 % (ref 0.0–0.2)

## 2020-09-25 NOTE — Discharge Summary (Signed)
Physician Discharge Summary  Patient ID: Debra Allen MRN: [id number] DOB/AGE: [birth year] 34 y.o.  PCP: Gildardo Pounds, NP  Admit date: 09/24/2020 Discharge date: 09/25/2020  Admission Diagnoses:  cholecystitis  Discharge Diagnoses:  same  Active Problems:   Gallstones   Surgery:  Lap cholecystectomy  Discharged Condition: improved  Hospital Course:   Had surgery by Dr. Marlou Starks on Friday.  Hg 11.7 on Sat and patient is ready for discharge.    Consults: none  Significant Diagnostic Studies: repeat CBC    Discharge Exam: Blood pressure 98/64, pulse 72, temperature 98.2 F (36.8 C), temperature source Oral, resp. rate 16, height 5\' 2"  (1.575 m), weight 78.5 kg, SpO2 99 %. Incisions are not red and slight ecchymosis on one.    Disposition: Discharge disposition: 01-Home or Self Care       Discharge Instructions    Call MD for:  persistant nausea and vomiting   Complete by: As directed    Call MD for:  redness, tenderness, or signs of infection (pain, swelling, redness, odor or green/yellow discharge around incision site)   Complete by: As directed    Diet - low sodium heart healthy   Complete by: As directed    Increase activity slowly   Complete by: As directed      Allergies as of 09/25/2020      Reactions   Keflex [cephalexin] Itching, Rash      Medication List    STOP taking these medications   cyclobenzaprine 10 MG tablet Commonly known as: FLEXERIL     TAKE these medications   acetaminophen 500 MG tablet Commonly known as: TYLENOL Take 500 mg by mouth every 6 (six) hours as needed for mild pain or moderate pain.   HYDROcodone-acetaminophen 5-325 MG tablet Commonly known as: NORCO/VICODIN Take 1-2 tablets by mouth every 6 (six) hours as needed for moderate pain or severe pain.   ibuprofen 600 MG tablet Commonly known as: ADVIL Take 1 tablet (600 mg total) by mouth 3 (three) times daily. X 7 days then prn pain What changed:   when  to take this  reasons to take this  additional instructions   levonorgestrel 20 MCG/24HR IUD Commonly known as: MIRENA 1 each by Intrauterine route once. Lot # [id number], Exp 06/18. Due for removal in 5 years 01/01/2020       Follow-up Information    Autumn Messing III, MD In 3 weeks.   Specialty: General Surgery Contact information: [address] [place] [address] [phone]               Signed: Pedro Earls 09/25/2020, 11:53 AM

## 2020-09-25 NOTE — Progress Notes (Signed)
Discharge instructions addressed; Pt in stable condition; Pt.'s husband picked her up at the [facility] lobby entrance. 

## 2020-09-27 ENCOUNTER — Encounter (HOSPITAL_COMMUNITY): Payer: Self-pay | Admitting: General Surgery

## 2020-09-27 LAB — SURGICAL PATHOLOGY

## 2020-09-27 NOTE — Anesthesia Postprocedure Evaluation (Signed)
Anesthesia Post Note  Patient: [name]  Procedure(s) Performed: LAPAROSCOPIC CHOLECYSTECTOMY (N/A Abdomen)     Patient location during evaluation: PACU Anesthesia Type: General Level of consciousness: awake and alert Pain management: pain level controlled Vital Signs Assessment: post-procedure vital signs reviewed and stable Respiratory status: spontaneous breathing, nonlabored ventilation, respiratory function stable and patient connected to nasal cannula oxygen Cardiovascular status: blood pressure returned to baseline and stable Postop Assessment: no apparent nausea or vomiting Anesthetic complications: no   No complications documented.  Last Vitals:  Vitals:   09/25/20 0827 09/25/20 1203  BP: 98/64 96/69  Pulse: 72 78  Resp: 16 17  Temp: 36.8 C 36.6 C  SpO2: 99% 99%    Last Pain:  Vitals:   09/25/20 1652  TempSrc:   PainSc: 2                  Tiajuana Amass

## 2020-12-08 NOTE — Telephone Encounter (Signed)
error 

## 2021-05-04 IMAGING — CT CT RENAL STONE PROTOCOL
2 of 3 series · 15 of 42 positions shown, 19 images · non-contrast
Comparison: CTA chest 02/11/2010.

CLINICAL DATA: 33-year-old female with persistent right side flank
pain for 2 weeks.

EXAM:
CT ABDOMEN AND PELVIS WITHOUT CONTRAST
TECHNIQUE: Multidetector CT imaging of the abdomen and pelvis was performed
following the standard protocol without IV contrast.

[Series 3: stone study 5.0 i30f 2 · axial · 0.98mm/px · z∈[+930,+1360]mm · 12 of 98 slices shown, 16 images]
[im 8/98  soft-tissue]
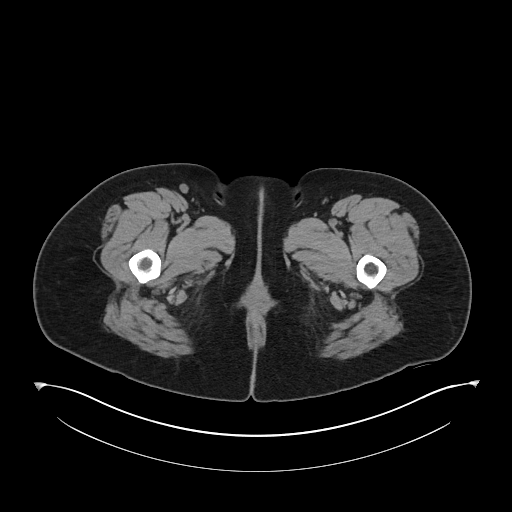
[im 8/98  bone]
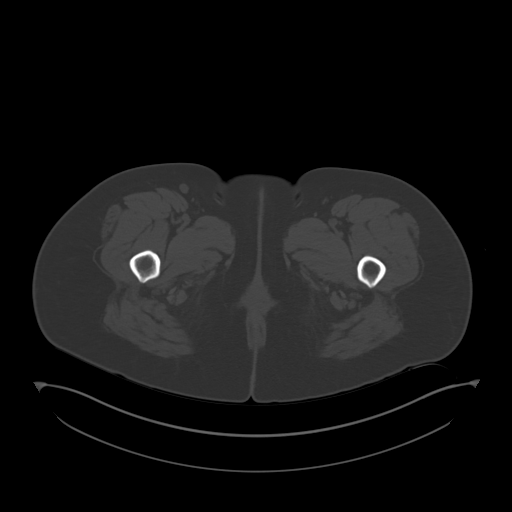
[im 15/98  soft-tissue]
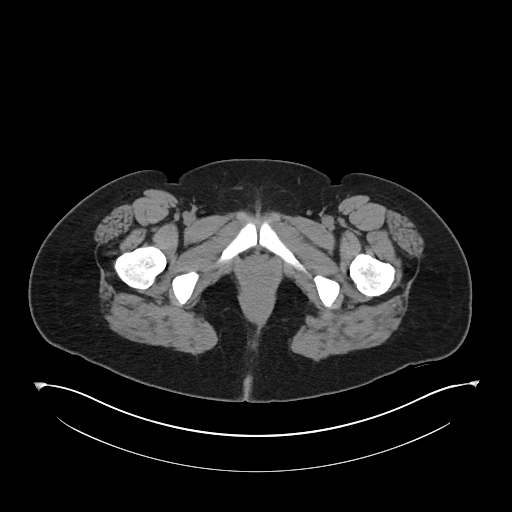
[im 27/98  soft-tissue]
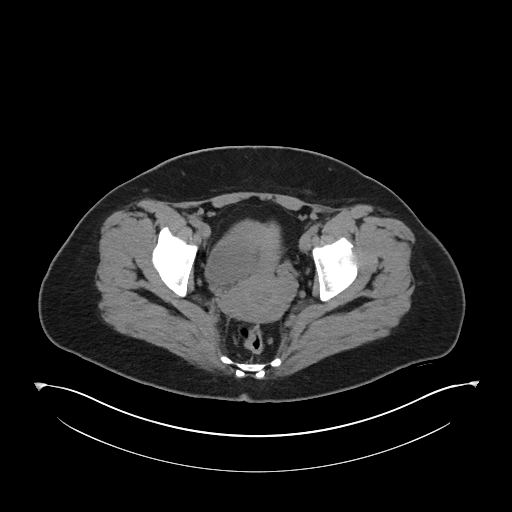
[im 34/98  soft-tissue]
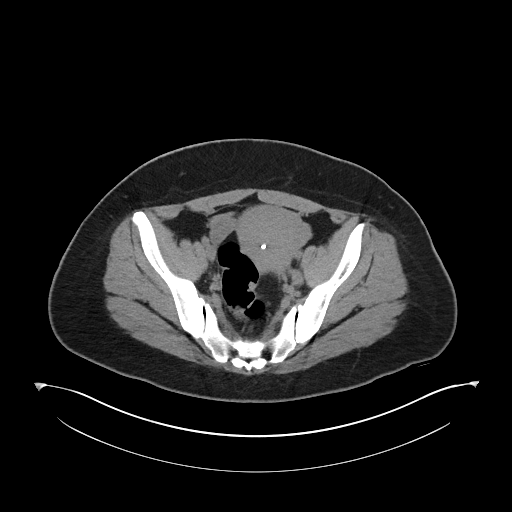
[im 45/98  soft-tissue]
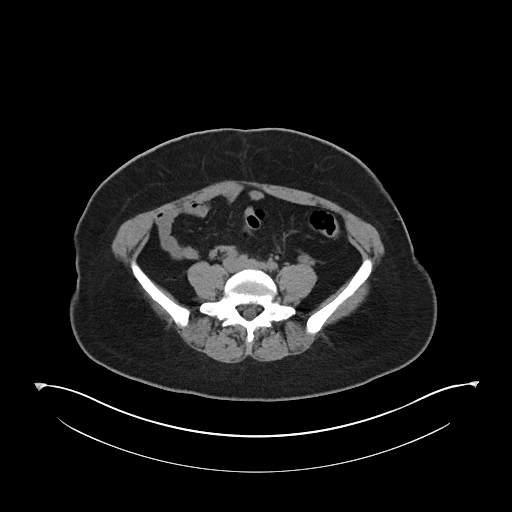
[im 53/98  soft-tissue]
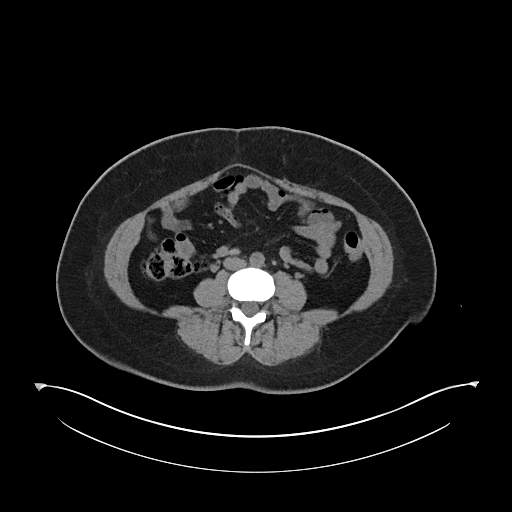
[im 64/98  soft-tissue]
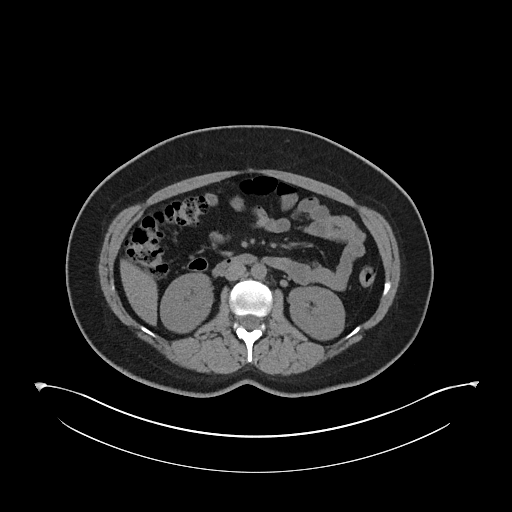
[im 71/98  soft-tissue]
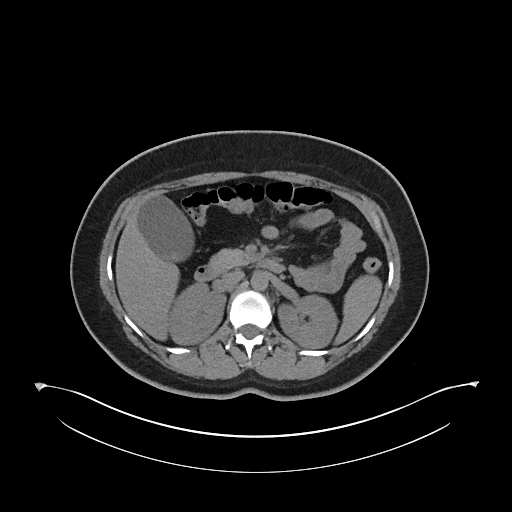
[im 83/98  soft-tissue]
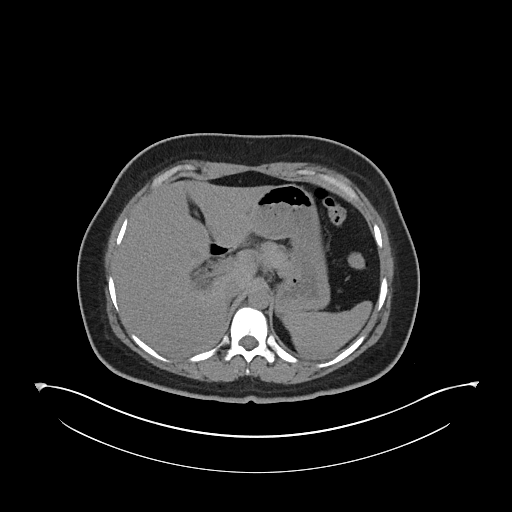
[im 83/98  lung]
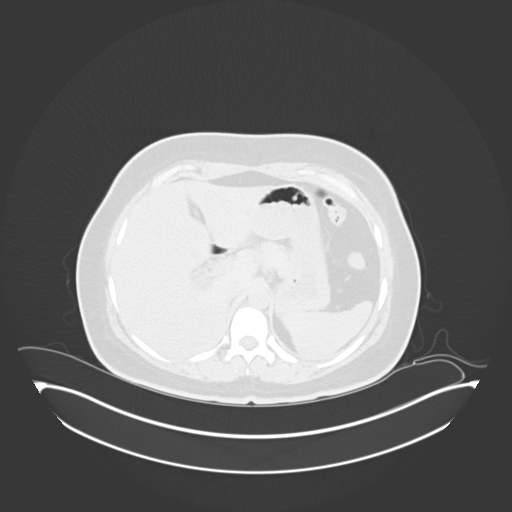
[im 83/98  bone]
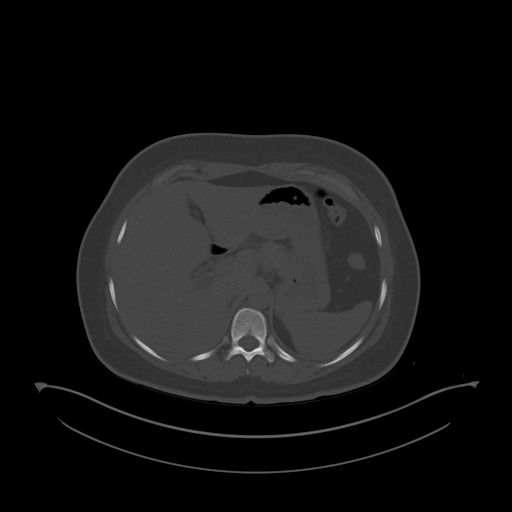
[im 86/98  lung]
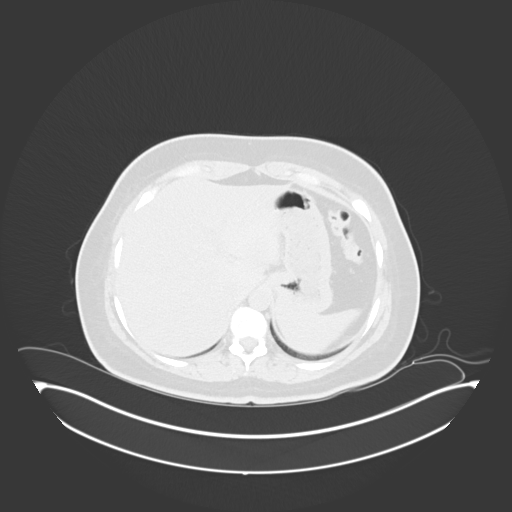
[im 90/98  soft-tissue]
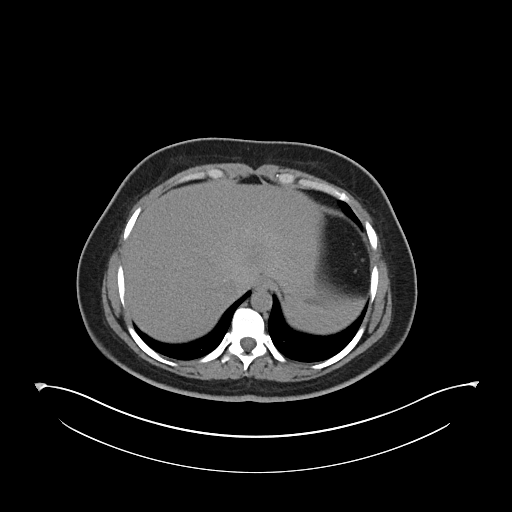
[im 90/98  lung]
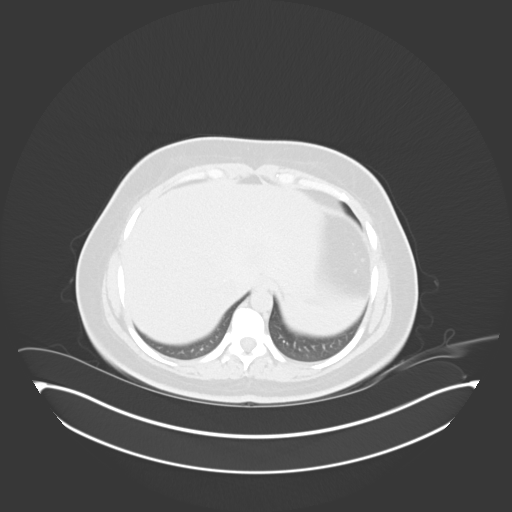
[im 94/98  lung]
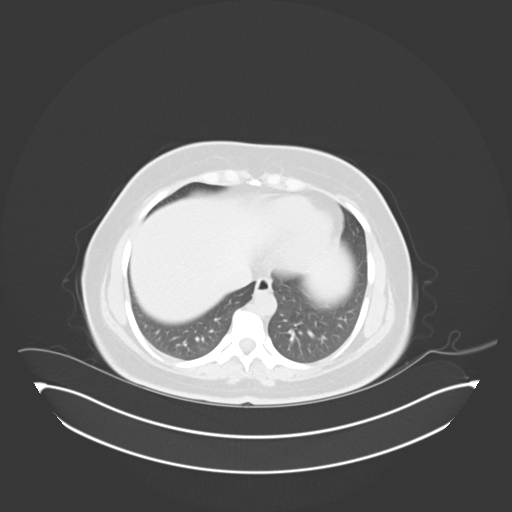

[Series 6: coronal soft tissue · coronal · 0.87mm/px · 3 of 100 slices shown]
[im 34/100  soft-tissue]
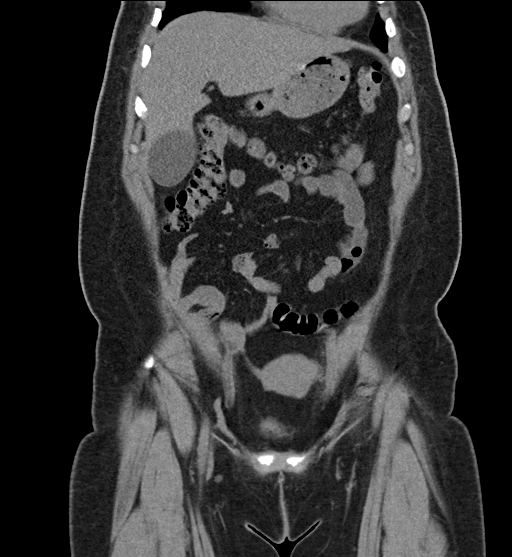
[im 45/100  soft-tissue]
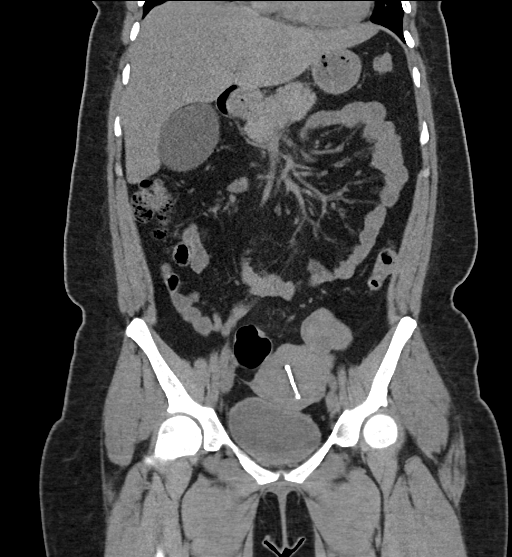
[im 56/100  soft-tissue]
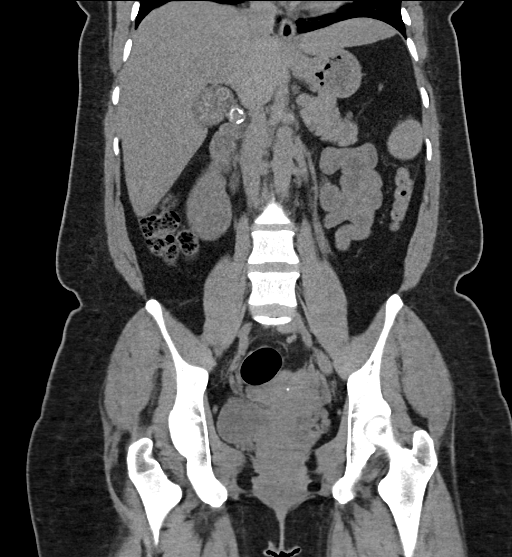

[15 of 42 positions shown; findings below may reference images not displayed]

FINDINGS: Lower chest: Negative; minor atelectasis at the lung bases.

Hepatobiliary: Extensive gallstones (coronal image 51). No
pericholecystic inflammation. Negative noncontrast liver.

Pancreas: Negative.

Spleen: Negative.

Adrenals/Urinary Tract: Normal adrenal glands.

Negative noncontrast kidneys. No nephrolithiasis. Decompressed
proximal ureters. Unremarkable urinary bladder.

Stomach/Bowel: Negative noncontrast large bowel including evidence
of a normal appendix on coronal image 48. Negative terminal ileum.
No dilated small bowel. Negative stomach and duodenum. No free air,
free fluid.

Vascular/Lymphatic: Normal caliber aorta. Vascular patency is not
evaluated in the absence of IV contrast. No lymphadenopathy.

Reproductive: IUD in place, and may be twisted perpendicular with
respect to the uterine cornua (coronal image 45) but otherwise
negative.

Other: No pelvic free fluid.

Musculoskeletal: Negative.
IMPRESSION: 1. Extensive Cholelithiasis. No CT evidence of acute cholecystitis.
Right upper quadrant ultrasound would be complementary if symptoms
persist.

2. IUD in place, and may be twisted perpendicular to the plane of
the uterine cornua. See coronal image 45.

3. No urinary calculus or obstructive uropathy.

## 2021-05-26 ENCOUNTER — Ambulatory Visit: Payer: Self-pay | Admitting: Physician Assistant

## 2021-05-26 ENCOUNTER — Other Ambulatory Visit: Payer: Self-pay

## 2021-05-26 ENCOUNTER — Ambulatory Visit: Payer: Self-pay | Admitting: *Deleted

## 2021-05-26 VITALS — BP 108/75 | HR 80 | Temp 98.7°F | Resp 18 | Ht 63.0 in | Wt 180.0 lb

## 2021-05-26 DIAGNOSIS — R42 Dizziness and giddiness: Secondary | ICD-10-CM

## 2021-05-26 DIAGNOSIS — R110 Nausea: Secondary | ICD-10-CM

## 2021-05-26 DIAGNOSIS — N912 Amenorrhea, unspecified: Secondary | ICD-10-CM

## 2021-05-26 DIAGNOSIS — Z3202 Encounter for pregnancy test, result negative: Secondary | ICD-10-CM

## 2021-05-26 DIAGNOSIS — G44209 Tension-type headache, unspecified, not intractable: Secondary | ICD-10-CM

## 2021-05-26 LAB — POCT GLYCOSYLATED HEMOGLOBIN (HGB A1C): Hemoglobin A1C: 5.1 % (ref 4.0–5.6)

## 2021-05-26 LAB — POCT URINE PREGNANCY: Preg Test, Ur: NEGATIVE

## 2021-05-26 NOTE — Telephone Encounter (Signed)
Patient called to reports symptoms of dizziness x 2-3 days. C/o nausea, vomiting, especially when waking up. C/o headache right side of head. Feels like room tilting .. feels faint at times. Has happened while driving today. Vomited x 1 yesterday.  Denies weakness on either side of body, no blurred or double vision. Encouraged patient to go to [facility] or ED and gave [phone] Primary care [place] . Patient still wanted appt with PCP . Non available with in 24 hours. Appt scheduled 06/24/21. Care advise given. Patient verbalized understanding of care advise and to call back or go to [facility] or ED is symptoms worsen.   Reason for Disposition  [1] MODERATE dizziness (e.g., vertigo; feels very unsteady, interferes with normal activities) AND [2] has NOT been evaluated by physician for this  Answer Assessment - Initial Assessment Questions 1. DESCRIPTION: "Describe your dizziness."     Room is tilting  2. VERTIGO: "Do you feel like either you or the room is spinning or tilting?"      Tilting  3. LIGHTHEADED: "Do you feel lightheaded?" (e.g., somewhat faint, woozy, weak upon standing)     Somewhat faint at times just when standing  4. SEVERITY: "How bad is it?"  "Can you walk?"   - MILD: Feels slightly dizzy and unsteady, but is walking normally.   - MODERATE: Feels unsteady when walking, but not falling; interferes with normal activities (e.g., school, work).   - SEVERE: Unable to walk without falling, or requires assistance to walk without falling.     Moderate felt dizziness while driving today  5. ONSET:  "When did the dizziness begin?"     2-3 days ago  6. AGGRAVATING FACTORS: "Does anything make it worse?" (e.g., standing, change in head position)     Standing changing head position 7. CAUSE: "What do you think is causing the dizziness?"     Birth control pills  8. RECURRENT SYMPTOM: "Have you had dizziness before?" If Yes, ask: "When was the last time?" "What happened that time?"     Na  9. OTHER  SYMPTOMS: "Do you have any other symptoms?" (e.g., headache, weakness, numbness, vomiting, earache)     Headache , nausea, vomiting x 1 yesterday , room tilting. 10. PREGNANCY: "Is there any chance you are pregnant?" "When was your last menstrual period?"       na  Protocols used: Dizziness - Vertigo-A-AH

## 2021-05-26 NOTE — Patient Instructions (Signed)
I do encourage you to hold the birth control pills at this time to see if that is causing your symptoms.  I encourage you to increase your hydration and make sure that you are getting good rest.  If you need to use melatonin to help you with your sleep, it is fine to do so.  You will purchase this over-the-counter.  We will call you with your lab results, I hope that you feel better soon.  Kennieth Rad, PA-C Physician Assistant [facility] [email or web address]   Dizziness Dizziness is a common problem. It is a feeling of unsteadiness or light-headedness. You may feel like you are about to faint. Dizziness can lead to injury if you stumble or fall. Anyone can become dizzy, but dizziness is more common in older adults. This condition can be caused by a number ofthings, including medicines, dehydration, or illness. Follow these instructions at home: Eating and drinking  Drink enough fluid to keep your urine pale yellow. This helps to keep you from becoming dehydrated. Try to drink more clear fluids, such as water. Do not drink alcohol. Limit your caffeine intake if told to do so by your health care provider. Check ingredients and nutrition facts to see if a food or beverage contains caffeine. Limit your salt (sodium) intake if told to do so by your health care provider. Check ingredients and nutrition facts to see if a food or beverage contains sodium.  Activity  Avoid making quick movements. Rise slowly from chairs and steady yourself until you feel okay. In the morning, first sit up on the side of the bed. When you feel okay, stand slowly while you hold onto something until you know that your balance is good. If you need to stand in one place for a long time, move your legs often. Tighten and relax the muscles in your legs while you are standing. Do not drive or use machinery if you feel dizzy. Avoid bending down if you feel dizzy.  Place items in your home so that they are easy for you to reach without leaning over.  Lifestyle Do not use any products that contain nicotine or tobacco. These products include cigarettes, chewing tobacco, and vaping devices, such as e-cigarettes. If you need help quitting, ask your health care provider. Try to reduce your stress level by using methods such as yoga or meditation. Talk with your health care provider if you need help to manage your stress. General instructions Watch your dizziness for any changes. Take over-the-counter and prescription medicines only as told by your health care provider. Talk with your health care provider if you think that your dizziness is caused by a medicine that you are taking. Tell a friend or a family member that you are feeling dizzy. If he or she notices any changes in your behavior, have this person call your health care provider. Keep all follow-up visits. This is important. Contact a health care provider if: Your dizziness does not go away or you have new symptoms. Your dizziness or light-headedness gets worse. You feel nauseous. You have reduced hearing. You have a fever. You have neck pain or a stiff neck. Your dizziness leads to an injury or a fall. Get help right away if: You vomit or have diarrhea and are unable to eat or drink anything. You have problems talking, walking, swallowing, or using your arms, hands, or legs. You feel generally weak. You have any bleeding. You are not thinking clearly or you have trouble  forming sentences. It may take a friend or family member to notice this. You have chest pain, abdominal pain, shortness of breath, or sweating. Your vision changes or you develop a severe headache. These symptoms may represent a serious problem that is an emergency. Do not wait to see if the symptoms will go away. Get medical help right away. Call your local emergency services (911 in the [place] Do not drive yourself to the  hospital. Summary Dizziness is a feeling of unsteadiness or light-headedness. This condition can be caused by a number of things, including medicines, dehydration, or illness. Anyone can become dizzy, but dizziness is more common in older adults. Drink enough fluid to keep your urine pale yellow. Do not drink alcohol. Avoid making quick movements if you feel dizzy. Monitor your dizziness for any changes. This information is not intended to replace advice given to you by your health care provider. Make sure you discuss any questions you have with your healthcare provider. Document Revised: 10/04/2020 Document Reviewed: 10/04/2020 Elsevier Patient Education  2022 [employer].

## 2021-05-26 NOTE — Progress Notes (Signed)
Established Patient Office Visit  Subjective:  Patient ID: Debra Allen, female    DOB: 1986-02-10  Age: 35 y.o. MRN: [id number]  CC:  Chief Complaint  Patient presents with   Dizziness     HPI Debra Allen reports that she has been having feelings of nausea on a daily basis for the past couple of weeks, states that this did correlate to when she started taking oral birth control pills.  Reports that she thought it may improve, but states that she now also is feeling very tired and during the day, having feelings of weakness, and the last 2 mornings she has had episodes of dizziness that have caused her to go back to bed.    Reports that she has not had a menses in the past 2 months, does endorse that she recently had her IUD removed.  Reports that she is drinking approximately 5 to 6 cups of water a day.  Reports that she is having difficulty sleeping, difficulty staying asleep sleep.  Reports that this has been present for the past 2 weeks as well.  Has not tried anything for relief.  Reports that she has a low stress level.       Past Medical History:  Diagnosis Date   Anxiety    Headache(784.0)    Migraine   Urinary tract infection     Past Surgical History:  Procedure Laterality Date   CHOLECYSTECTOMY N/A 09/24/2020   Procedure: LAPAROSCOPIC CHOLECYSTECTOMY;  Surgeon: Jovita Kussmaul, MD;  Location: [facility] OR;  Service: General;  Laterality: N/A;   NO PAST SURGERIES      Family History  Problem Relation Age of Onset   Diabetes Mother    Hypertension Maternal Grandmother    Diabetes Maternal Grandmother    Cancer Paternal Grandmother    Cancer Paternal Grandfather    Hypertension Father    Cancer Paternal Aunt        bladder cancer    Asthma Other    Obesity Other    Sleep apnea Other    Heart disease Other    Other Neg Hx     Social History   Socioeconomic History   Marital status: Single    Spouse name: Not on file   Number of  children: Not on file   Years of education: Not on file   Highest education level: Not on file  Occupational History   Not on file  Tobacco Use   Smoking status: Never   Smokeless tobacco: Never  Vaping Use   Vaping Use: Never used  Substance and Sexual Activity   Alcohol use: No   Drug use: No   Sexual activity: Yes    Birth control/protection: I.U.D.  Other Topics Concern   Not on file  Social History Narrative   Not on file   Social Determinants of Health   Financial Resource Strain: Not on file  Food Insecurity: Not on file  Transportation Needs: Not on file  Physical Activity: Not on file  Stress: Not on file  Social Connections: Not on file  Intimate Partner Violence: Not on file    Outpatient Medications Prior to Visit  Medication Sig Dispense Refill   acetaminophen (TYLENOL) 500 MG tablet Take 500 mg by mouth every 6 (six) hours as needed for mild pain or moderate pain.      AUROVELA FE 1/20 1-20 MG-MCG tablet Take 1 tablet by mouth daily.     ibuprofen (ADVIL,MOTRIN) 600 MG tablet Take 1  tablet (600 mg total) by mouth 3 (three) times daily. X 7 days then prn pain (Patient taking differently: Take 600 mg by mouth daily as needed for mild pain or moderate pain.) 60 tablet 0   HYDROcodone-acetaminophen (NORCO/VICODIN) 5-325 MG tablet Take 1-2 tablets by mouth every 6 (six) hours as needed for moderate pain or severe pain. (Patient not taking: Reported on 05/26/2021) 15 tablet 0   levonorgestrel (MIRENA) 20 MCG/24HR IUD 1 each by Intrauterine route once. Lot # [id number], Exp 06/18. Due for removal in 5 years 01/01/2020 (Patient not taking: Reported on 05/26/2021)     levonorgestrel (MIRENA) 20 MCG/24HR IUD      No facility-administered medications prior to visit.    Allergies  Allergen Reactions   Keflex [Cephalexin] Itching and Rash    ROS Review of Systems  Constitutional:  Positive for fatigue. Negative for chills and fever.  HENT: Negative.    Eyes: Negative.    Respiratory:  Negative for shortness of breath.   Cardiovascular:  Negative for chest pain.  Gastrointestinal:  Positive for nausea. Negative for abdominal pain, diarrhea and vomiting.  Endocrine: Negative.   Genitourinary:  Negative for dysuria, vaginal bleeding and vaginal discharge.  Musculoskeletal: Negative.   Skin: Negative.   Allergic/Immunologic: Negative.   Neurological:  Positive for dizziness and headaches. Negative for speech difficulty and weakness.  Hematological: Negative.   Psychiatric/Behavioral:  Negative for dysphoric mood, self-injury, sleep disturbance and suicidal ideas.      Objective:    Physical Exam Vitals and nursing note reviewed.  Constitutional:      Appearance: Normal appearance.  HENT:     Head: Normocephalic and atraumatic.     Right Ear: External ear normal.     Left Ear: External ear normal.     Nose: Nose normal.     Mouth/Throat:     Mouth: Mucous membranes are moist.     Pharynx: Oropharynx is clear.  Eyes:     Extraocular Movements: Extraocular movements intact.     Conjunctiva/sclera: Conjunctivae normal.     Pupils: Pupils are equal, round, and reactive to light.  Cardiovascular:     Rate and Rhythm: Normal rate and regular rhythm.     Pulses: Normal pulses.     Heart sounds: Normal heart sounds.  Pulmonary:     Effort: Pulmonary effort is normal.     Breath sounds: Normal breath sounds.  Musculoskeletal:        General: Normal range of motion.     Cervical back: Normal range of motion and neck supple.  Skin:    General: Skin is warm and dry.  Neurological:     General: No focal deficit present.     Mental Status: She is alert and oriented to person, place, and time.     Cranial Nerves: Cranial nerves are intact.     Motor: Motor function is intact.     Coordination: Coordination is intact.  Psychiatric:        Mood and Affect: Mood normal.        Behavior: Behavior normal.        Thought Content: Thought content normal.         Judgment: Judgment normal.    BP 108/75 (BP Location: Left Arm, Patient Position: Sitting, Cuff Size: Normal)   Pulse 80   Temp 98.7 F (37.1 C) (Oral)   Resp 18   Ht $R'5\' 3"'nW$  (1.6 m)   Wt 180 lb (81.6 kg)   SpO2  99%   BMI 31.89 kg/m  Wt Readings from Last 3 Encounters:  05/26/21 180 lb (81.6 kg)  09/24/20 173 lb 1 oz (78.5 kg)  06/09/20 176 lb (79.8 kg)     Health Maintenance Due  Topic Date Due   COVID-19 Vaccine (1) Never done   Pneumococcal Vaccine 14-23 Years old (1 - PCV) Never done   PAP SMEAR-Modifier  10/02/2020    There are no preventive care reminders to display for this patient.  Lab Results  Component Value Date   TSH 2.390 05/26/2021   Lab Results  Component Value Date   WBC 8.2 05/26/2021   HGB 12.9 05/26/2021   HCT 38.7 05/26/2021   MCV 90 05/26/2021   PLT 253 05/26/2021   Lab Results  Component Value Date   NA 140 05/26/2021   K 4.2 05/26/2021   CO2 22 09/25/2020   GLUCOSE 109 (H) 05/26/2021   BUN 8 05/26/2021   CREATININE 0.54 (L) 05/26/2021   BILITOT 0.5 05/26/2021   ALKPHOS 70 05/26/2021   AST 16 05/26/2021   ALT 160 (H) 09/25/2020   PROT 7.6 05/26/2021   ALBUMIN 4.6 05/26/2021   CALCIUM 10.0 05/26/2021   ANIONGAP 10 09/25/2020   EGFR 125 05/26/2021   Lab Results  Component Value Date   CHOL 168 10/02/2017   Lab Results  Component Value Date   HDL 57 10/02/2017   Lab Results  Component Value Date   LDLCALC 100 (H) 10/02/2017   Lab Results  Component Value Date   TRIG 54 10/02/2017   Lab Results  Component Value Date   CHOLHDL 2.9 10/02/2017   Lab Results  Component Value Date   HGBA1C 5.1 05/26/2021      Assessment & Plan:   Problem List Items Addressed This Visit   None Visit Diagnoses     Nausea    -  Primary   Relevant Orders   POCT urine pregnancy (Completed)   HgB A1c (Completed)   Dizziness and giddiness       Relevant Orders   CBC with Differential/Platelet (Completed)   Comp. Metabolic  Panel (12) (Completed)   TSH (Completed)   hCG, serum, qualitative (Completed)   Acute non intractable tension-type headache       Encounter for pregnancy test, result negative       Amenorrhea       Relevant Orders   hCG, serum, qualitative (Completed)       No orders of the defined types were placed in this encounter. 1. Nausea Urine pregnancy test negative, A1c within normal limits.  Encouraged patient to hold birth control pills to see if this helps resolve her symptoms, patient encouraged to use backup birth control.  Encourage patient to increase hydration, improve sleep hygiene.  Red flags given for prompt reevaluation. - POCT urine pregnancy - HgB A1c  2. Dizziness and giddiness  - CBC with Differential/Platelet - Comp. Metabolic Panel (12) - TSH - hCG, serum, qualitative  3. Acute non intractable tension-type headache   4. Encounter for pregnancy test, result negative   5. Amenorrhea  - hCG, serum, qualitative   I have reviewed the patient's medical history (PMH, PSH, Social History, Family History, Medications, and allergies) , and have been updated if relevant. I spent 31 minutes reviewing chart and  face to face time with patient.    Follow-up: Return if symptoms worsen or fail to improve.    Loraine Grip Mayers, PA-C

## 2021-05-26 NOTE — Progress Notes (Signed)
Patient presents with recent nausea, dizziness and HA intermittently over the last two weeks. Patient had 2 episodes of dizziness today Patient denies any URI symptoms related to the nausea.  Patient has eaten today.

## 2021-05-27 ENCOUNTER — Encounter: Payer: Self-pay | Admitting: Physician Assistant

## 2021-05-27 LAB — CBC WITH DIFFERENTIAL/PLATELET
Basophils Absolute: 0 10*3/uL (ref 0.0–0.2)
Basos: 1 %
EOS (ABSOLUTE): 0.1 10*3/uL (ref 0.0–0.4)
Eos: 1 %
Hematocrit: 38.7 % (ref 34.0–46.6)
Hemoglobin: 12.9 g/dL (ref 11.1–15.9)
Immature Grans (Abs): 0.1 10*3/uL (ref 0.0–0.1)
Immature Granulocytes: 1 %
Lymphocytes Absolute: 2.2 10*3/uL (ref 0.7–3.1)
Lymphs: 27 %
MCH: 29.9 pg (ref 26.6–33.0)
MCHC: 33.3 g/dL (ref 31.5–35.7)
MCV: 90 fL (ref 79–97)
Monocytes Absolute: 0.4 10*3/uL (ref 0.1–0.9)
Monocytes: 5 %
Neutrophils Absolute: 5.4 10*3/uL (ref 1.4–7.0)
Neutrophils: 65 %
Platelets: 253 10*3/uL (ref 150–450)
RBC: 4.32 x10E6/uL (ref 3.77–5.28)
RDW: 12.6 % (ref 11.7–15.4)
WBC: 8.2 10*3/uL (ref 3.4–10.8)

## 2021-05-27 LAB — COMP. METABOLIC PANEL (12)
AST: 16 IU/L (ref 0–40)
Albumin/Globulin Ratio: 1.5 (ref 1.2–2.2)
Albumin: 4.6 g/dL (ref 3.8–4.8)
Alkaline Phosphatase: 70 IU/L (ref 44–121)
BUN/Creatinine Ratio: 15 (ref 9–23)
BUN: 8 mg/dL (ref 6–20)
Bilirubin Total: 0.5 mg/dL (ref 0.0–1.2)
Calcium: 10 mg/dL (ref 8.7–10.2)
Chloride: 100 mmol/L (ref 96–106)
Creatinine, Ser: 0.54 mg/dL — ABNORMAL LOW (ref 0.57–1.00)
Globulin, Total: 3 g/dL (ref 1.5–4.5)
Glucose: 109 mg/dL — ABNORMAL HIGH (ref 65–99)
Potassium: 4.2 mmol/L (ref 3.5–5.2)
Sodium: 140 mmol/L (ref 134–144)
Total Protein: 7.6 g/dL (ref 6.0–8.5)
eGFR: 125 mL/min/{1.73_m2} (ref >59)

## 2021-05-27 LAB — HCG, SERUM, QUALITATIVE: hCG,Beta Subunit,Qual,Serum: NEGATIVE m[IU]/mL (ref <6)

## 2021-05-27 LAB — TSH: TSH: 2.39 u[IU]/mL (ref 0.450–4.500)

## 2021-05-27 NOTE — Telephone Encounter (Signed)
Noted  

## 2021-05-31 NOTE — Progress Notes (Signed)
Patient has viewed results via mychart with no concerns.

## 2021-06-24 ENCOUNTER — Ambulatory Visit: Payer: Self-pay | Attending: Nurse Practitioner | Admitting: Nurse Practitioner

## 2021-06-24 ENCOUNTER — Other Ambulatory Visit: Payer: Self-pay

## 2021-06-24 ENCOUNTER — Encounter: Payer: Self-pay | Admitting: Nurse Practitioner

## 2021-06-24 VITALS — BP 108/73 | HR 76 | Temp 98.8°F | Wt 186.8 lb

## 2021-06-24 DIAGNOSIS — L819 Disorder of pigmentation, unspecified: Secondary | ICD-10-CM

## 2021-06-24 DIAGNOSIS — H8113 Benign paroxysmal vertigo, bilateral: Secondary | ICD-10-CM

## 2021-06-24 DIAGNOSIS — R1030 Lower abdominal pain, unspecified: Secondary | ICD-10-CM

## 2021-06-24 LAB — POCT URINALYSIS DIP (CLINITEK)
Bilirubin, UA: NEGATIVE
Glucose, UA: NEGATIVE mg/dL
Ketones, POC UA: NEGATIVE mg/dL
Leukocytes, UA: NEGATIVE
Nitrite, UA: NEGATIVE
POC PROTEIN,UA: NEGATIVE
Spec Grav, UA: 1.015 (ref 1.010–1.025)
Urobilinogen, UA: 0.2 E.U./dL
pH, UA: 5.5 (ref 5.0–8.0)

## 2021-06-24 LAB — POCT URINE PREGNANCY: Preg Test, Ur: NEGATIVE

## 2021-06-24 MED ORDER — FAMOTIDINE 40 MG PO TABS
40.0000 mg | ORAL_TABLET | Freq: Every day | ORAL | 0 refills | Status: DC
  Filled 2021-06-24: qty 30, 30d supply, fill #0

## 2021-06-24 MED ORDER — HYDROXYZINE HCL 10 MG PO TABS
10.0000 mg | ORAL_TABLET | Freq: Three times a day (TID) | ORAL | 0 refills | Status: DC | PRN
  Filled 2021-06-24: qty 60, 20d supply, fill #0

## 2021-06-24 NOTE — Progress Notes (Signed)
Assessment & Plan:  Debra Allen was seen today for re-establish care.  Diagnoses and all orders for this visit:  Benign paroxysmal positional vertigo due to bilateral vestibular disorder -     Lipid panel -     Ambulatory referral to ENT -     Thyroid Panel With TSH -     hydrOXYzine (ATARAX/VISTARIL) 10 MG tablet; Take 1 tablet (10 mg total) by mouth 3 (three) times daily as needed.  Lower abdominal pain -     POCT urine pregnancy -     famotidine (PEPCID) 40 MG tablet; Take 1 tablet (40 mg total) by mouth daily. -     POCT URINALYSIS DIP (CLINITEK)  Hyperpigmented skin lesion -     Ambulatory referral to Dermatology   Patient has been counseled on age-appropriate routine health concerns for screening and prevention. These are reviewed and up-to-date. Referrals have been placed accordingly. Immunizations are up-to-date or declined.    Subjective:   Chief Complaint  Patient presents with   re-establish care   HPI Debra Allen 35 y.o. female presents to office today with complaints of vertigo and lower pelvic pain   Vertigo - Dizziness: Patient presents with dizziness .  The dizziness has been present for 1 month. The patient describes the symptoms as disequalibirum, vertigo, and lightheadedness. Symptoms are exacerbated by rapid head movements, rolling over in bed, rising from supine position, rising from squatting or sitting position, motion, and driving. Patient denies aural pressure otalgia otorrhea tinnitus hearing loss. Previous work up has been [facility] which were normal. TSH and lipid panel pending.    Abdominal Pain: Patient complains of lower abdominal pain. The pain is described as aching and pressure-like, and is 4/10 in intensity. Pain is located in the lower pelvic region and does not radiate. Onset was several weeks ago. Symptoms have been unchanged but controlled since. Aggravating factors: none.  Alleviating factors: None. Goes away on its own.  The  patient denies constipation, diarrhea, nausea, and vomiting. Gallbladder was removed last year.       Skin Lesion She has 2 hyperpigmented lesions on the lower right leg. She states they have darkened in pigment over time. Believes she bitten by chiggers a while back but not clear where the lesions arose.    Review of Systems  Constitutional:  Negative for fever, malaise/fatigue and weight loss.  HENT: Negative.  Negative for nosebleeds.   Eyes: Negative.  Negative for blurred vision, double vision and photophobia.  Respiratory: Negative.  Negative for cough and shortness of breath.   Cardiovascular: Negative.  Negative for chest pain, palpitations and leg swelling.  Gastrointestinal:  Positive for abdominal pain. Negative for blood in stool, constipation, diarrhea, heartburn, melena, nausea and vomiting.  Musculoskeletal: Negative.  Negative for myalgias.  Skin:        Skin lesion lower right leg  Neurological:  Positive for dizziness. Negative for focal weakness, seizures and headaches.  Psychiatric/Behavioral: Negative.  Negative for suicidal ideas.    Past Medical History:  Diagnosis Date   Anxiety    Headache(784.0)    Migraine   Urinary tract infection     Past Surgical History:  Procedure Laterality Date   CHOLECYSTECTOMY N/A 09/24/2020   Procedure: LAPAROSCOPIC CHOLECYSTECTOMY;  Surgeon: Jovita Kussmaul, MD;  Location: [facility] OR;  Service: General;  Laterality: N/A;   NO PAST SURGERIES      Family History  Problem Relation Age of Onset   Diabetes Mother  Hypertension Maternal Grandmother    Diabetes Maternal Grandmother    Cancer Paternal Grandmother    Cancer Paternal Grandfather    Hypertension Father    Cancer Paternal Aunt        bladder cancer    Asthma Other    Obesity Other    Sleep apnea Other    Heart disease Other    Other Neg Hx     Social History Reviewed with no changes to be made today.   Outpatient Medications Prior to Visit  Medication Sig  Dispense Refill   acetaminophen (TYLENOL) 500 MG tablet Take 500 mg by mouth every 6 (six) hours as needed for mild pain or moderate pain.      AUROVELA FE 1/20 1-20 MG-MCG tablet Take 1 tablet by mouth daily.     HYDROcodone-acetaminophen (NORCO/VICODIN) 5-325 MG tablet Take 1-2 tablets by mouth every 6 (six) hours as needed for moderate pain or severe pain. (Patient not taking: Reported on 05/26/2021) 15 tablet 0   ibuprofen (ADVIL,MOTRIN) 600 MG tablet Take 1 tablet (600 mg total) by mouth 3 (three) times daily. X 7 days then prn pain (Patient taking differently: Take 600 mg by mouth daily as needed for mild pain or moderate pain.) 60 tablet 0   No facility-administered medications prior to visit.    Allergies  Allergen Reactions   Keflex [Cephalexin] Itching and Rash       Objective:    BP 108/73 (BP Location: Left Arm, Patient Position: Sitting, Cuff Size: Normal)   Pulse 76   Temp 98.8 F (37.1 C) (Oral)   Wt 186 lb 12.8 oz (84.7 kg)   SpO2 97%   BMI 33.09 kg/m  Wt Readings from Last 3 Encounters:  06/24/21 186 lb 12.8 oz (84.7 kg)  05/26/21 180 lb (81.6 kg)  09/24/20 173 lb 1 oz (78.5 kg)    Physical Exam Vitals and nursing note reviewed.  Constitutional:      Appearance: She is well-developed.  HENT:     Head: Normocephalic and atraumatic.     Right Ear: Hearing, tympanic membrane, ear canal and external ear normal.     Left Ear: Hearing, tympanic membrane, ear canal and external ear normal.     Nose:     Right Turbinates: Not enlarged, swollen or pale.     Left Turbinates: Not enlarged, swollen or pale.     Right Sinus: No maxillary sinus tenderness or frontal sinus tenderness.     Left Sinus: No maxillary sinus tenderness or frontal sinus tenderness.  Cardiovascular:     Rate and Rhythm: Normal rate and regular rhythm.     Heart sounds: Normal heart sounds. No murmur heard.   No friction rub. No gallop.  Pulmonary:     Effort: Pulmonary effort is normal. No  tachypnea or respiratory distress.     Breath sounds: Normal breath sounds. No decreased breath sounds, wheezing, rhonchi or rales.  Chest:     Chest wall: No tenderness.  Abdominal:     General: Bowel sounds are normal.     Palpations: Abdomen is soft.  Musculoskeletal:        General: Normal range of motion.     Cervical back: Normal range of motion.  Skin:    General: Skin is warm and dry.  Neurological:     Mental Status: She is alert and oriented to person, place, and time.     Motor: No weakness.     Coordination: Romberg sign negative.  Coordination normal.  Psychiatric:        Behavior: Behavior normal. Behavior is cooperative.        Thought Content: Thought content normal.        Judgment: Judgment normal.         Patient has been counseled extensively about nutrition and exercise as well as the importance of adherence with medications and regular follow-up. The patient was given clear instructions to go to ER or return to medical center if symptoms don't improve, worsen or new problems develop. The patient verbalized understanding.   Follow-up: Return in about 3 weeks (around 07/15/2021) for TELE 810 or 130.   Gildardo Pounds, FNP-BC [facility] [place], [place]   06/24/2021, 4:42 PM

## 2021-06-25 LAB — LIPID PANEL
Chol/HDL Ratio: 3.1 ratio (ref 0.0–4.4)
Cholesterol, Total: 182 mg/dL (ref 100–199)
HDL: 58 mg/dL (ref >39)
LDL Chol Calc (NIH): 111 mg/dL — ABNORMAL HIGH (ref 0–99)
Triglycerides: 70 mg/dL (ref 0–149)
VLDL Cholesterol Cal: 13 mg/dL (ref 5–40)

## 2021-06-25 LAB — THYROID PANEL WITH TSH
Free Thyroxine Index: 1.7 (ref 1.2–4.9)
T3 Uptake Ratio: 22 % — ABNORMAL LOW (ref 24–39)
T4, Total: 7.5 ug/dL (ref 4.5–12.0)
TSH: 1.71 u[IU]/mL (ref 0.450–4.500)

## 2021-07-19 ENCOUNTER — Telehealth: Payer: Self-pay | Admitting: Nurse Practitioner

## 2021-07-19 ENCOUNTER — Ambulatory Visit: Payer: Self-pay | Attending: Nurse Practitioner | Admitting: Nurse Practitioner

## 2021-07-19 ENCOUNTER — Other Ambulatory Visit: Payer: Self-pay

## 2021-07-19 NOTE — Telephone Encounter (Signed)
No answer. Mailbox full and can not leave any messages

## 2022-03-27 ENCOUNTER — Ambulatory Visit: Payer: Self-pay

## 2022-03-27 NOTE — Telephone Encounter (Signed)
?  Chief Complaint: abdominal pain ?Symptoms: burning sensation in abdomen, weight gain, numbness in hands  ?Frequency: ongoing for months, abdominal pain 1 month  ?Pertinent Negatives: NA ?Disposition: '[]'$ ED /'[]'$ Urgent Care (no appt availability in office) / '[x]'$ Appointment(In office/virtual)/ '[]'$  [facility]/ '[]'$ Home Care/ '[]'$ Refused Recommended Disposition /'[]'$ [facility]/ '[]'$  Follow-up with PCP ?Additional Notes: scheduled pt for VV on 04/04/22 at 1610 with PCP. Pts current weight is 198 lbs. She is concerned about the burning sensation in the abdomen.  ? ?Reason for Disposition ? [1] MILD pain (e.g., does not interfere with normal activities) AND [2] pain comes and goes (cramps) AND [3] present > 48 hours  (Exception: this same abdominal pain is a chronic symptom recurrent or ongoing AND present > 4 weeks) ? ?Answer Assessment - Initial Assessment Questions ?1. LOCATION: "Where does it hurt?"  ?     ?3. ONSET: "When did the pain begin?" (e.g., minutes, hours or days ago)  ?    1 month  ?5. PATTERN "Does the pain come and go, or is it constant?" ?   - If constant: "Is it getting better, staying the same, or worsening?"  ?    (Note: Constant means the pain never goes away completely; most serious pain is constant and it progresses)  ?   - If intermittent: "How long does it last?" "Do you have pain now?" ?    (Note: Intermittent means the pain goes away completely between bouts) ?    Comes and goes  ?6. SEVERITY: "How bad is the pain?"  (e.g., Scale 1-10; mild, moderate, or severe) ?  - MILD (1-3): doesn't interfere with normal activities, abdomen soft and not tender to touch  ?  - MODERATE (4-7): interferes with normal activities or awakens from sleep, abdomen tender to touch  ?  - SEVERE (8-10): excruciating pain, doubled over, unable to do any normal activities  ?    Burning sensation  ?10. OTHER SYMPTOMS: "Do you have any other symptoms?" (e.g., back pain, diarrhea, fever, urination pain,  vomiting) ?      Weight gain and numbness in hands ? ?Protocols used: Abdominal Pain - Female-A-AH ? ?

## 2022-04-04 ENCOUNTER — Ambulatory Visit: Payer: Self-pay | Attending: Nurse Practitioner | Admitting: Nurse Practitioner

## 2022-04-04 ENCOUNTER — Encounter: Payer: Self-pay | Admitting: Nurse Practitioner

## 2022-04-04 DIAGNOSIS — R7309 Other abnormal glucose: Secondary | ICD-10-CM

## 2022-04-04 DIAGNOSIS — R102 Pelvic and perineal pain: Secondary | ICD-10-CM

## 2022-04-04 DIAGNOSIS — E785 Hyperlipidemia, unspecified: Secondary | ICD-10-CM

## 2022-04-04 DIAGNOSIS — D649 Anemia, unspecified: Secondary | ICD-10-CM

## 2022-04-04 DIAGNOSIS — R635 Abnormal weight gain: Secondary | ICD-10-CM

## 2022-04-04 DIAGNOSIS — E559 Vitamin D deficiency, unspecified: Secondary | ICD-10-CM

## 2022-04-04 DIAGNOSIS — E669 Obesity, unspecified: Secondary | ICD-10-CM

## 2022-04-04 NOTE — Progress Notes (Signed)
Virtual Visit Note  I discussed the limitations, risks, security and privacy concerns of performing an evaluation and management service by video and the availability of in person appointments. I also discussed with the patient that there may be a patient responsible charge related to this service. The patient expressed understanding and agreed to proceed.    I connected with Debra Allen on 04/04/22  at   4:10 PM EDT  EDT by VIDEO and verified that I am speaking with the correct person using two identifiers.   Location of Patient: Private Residence   Location of Provider: [facility]    Persons participating in VIRTUAL visit: Debra Denver FNP-BC Debra Allen    History of Present Illness: VIRTUAL visit for: Left pelvic pain  Endorses chronic left lower pelvic pain. This is not a new concern and previous work up has been normal. She will likely need a pelvic US if blood work/UA normal. She had her IUD removed a few years ago. Menstrual cycles are currently normal. She does endorse infrequent bowel movements and abdominal bloating which is worse when she drinks water or based on certain foods she eats.   She has endorses swelling in both of her legs at night as well as in her fingertips along.    She is concerned about weight gain despite walking. Diet is not optimal.     Past Medical History:  Diagnosis Date   Anxiety    Headache(784.0)    Migraine   Urinary tract infection     Past Surgical History:  Procedure Laterality Date   CHOLECYSTECTOMY N/A 09/24/2020   Procedure: LAPAROSCOPIC CHOLECYSTECTOMY;  Surgeon: Griselda Miner, MD;  Location: [facility] OR;  Service: General;  Laterality: N/A;   NO PAST SURGERIES      Family History  Problem Relation Age of Onset   Diabetes Mother    Hypertension Maternal Grandmother    Diabetes Maternal Grandmother    Cancer Paternal Grandmother    Cancer Paternal Grandfather     Hypertension Father    Cancer Paternal Aunt        bladder cancer    Asthma Other    Obesity Other    Sleep apnea Other    Heart disease Other    Other Neg Hx     Social History   Socioeconomic History   Marital status: Single    Spouse name: Not on file   Number of children: Not on file   Years of education: Not on file   Highest education level: Not on file  Occupational History   Not on file  Tobacco Use   Smoking status: Never   Smokeless tobacco: Never  Vaping Use   Vaping Use: Never used  Substance and Sexual Activity   Alcohol use: No   Drug use: No   Sexual activity: Yes    Birth control/protection: I.U.D.  Other Topics Concern   Not on file  Social History Narrative   Not on file   Social Determinants of Health   Financial Resource Strain: Not on file  Food Insecurity: Not on file  Transportation Needs: Not on file  Physical Activity: Not on file  Stress: Not on file  Social Connections: Not on file     Observations/Objective: Awake, alert and oriented x 3   Review of Systems  Constitutional:  Negative for fever, malaise/fatigue and weight loss.       Weight gain  HENT: Negative.  Negative for nosebleeds.  Eyes: Negative.  Negative for blurred vision, double vision and photophobia.  Respiratory: Negative.  Negative for cough and shortness of breath.   Cardiovascular:  Positive for leg swelling. Negative for chest pain and palpitations.  Gastrointestinal: Negative.  Negative for heartburn, nausea and vomiting.  Genitourinary:        Pelvic pain  Musculoskeletal: Negative.  Negative for myalgias.  Neurological:  Positive for sensory change. Negative for dizziness, focal weakness, seizures and headaches.  Psychiatric/Behavioral: Negative.  Negative for suicidal ideas.    Assessment and Plan: Diagnoses and all orders for this visit:  Pelvic pain Will need pelvic US if work up normal -     Urinalysis, Complete; Future -     CMP14+EGFR;  Future  Weight gain -     CMP14+EGFR; Future -     Thyroid Panel With TSH; Future Discussed pedometer, calorie counting app  Anemia, unspecified type -     CBC with Differential; Future -     Thyroid Panel With TSH; Future  Elevated glucose -     Hemoglobin A1c; Future  Obesity (BMI 30-39.9) -     Lipid panel; Future  Vitamin D deficiency disease -     VITAMIN D 25 Hydroxy (Vit-D Deficiency, Fractures); Future  Dyslipidemia, goal LDL below 100 -     Lipid panel; Future     Follow Up Instructions Return for PAP SMEAR.     I discussed the assessment and treatment plan with the patient. The patient was provided an opportunity to ask questions and all were answered. The patient agreed with the plan and demonstrated an understanding of the instructions.   The patient was advised to call back or seek an in-person evaluation if the symptoms worsen or if the condition fails to improve as anticipated.  I provided 15 minutes of face-to-face time during this encounter including median intraservice time, reviewing previous notes, labs, imaging, medications and explaining diagnosis and management.  Gildardo Pounds, FNP-BC

## 2022-04-27 ENCOUNTER — Encounter: Payer: Self-pay | Admitting: Physician Assistant

## 2022-04-27 ENCOUNTER — Ambulatory Visit: Payer: Self-pay | Attending: Physician Assistant | Admitting: Physician Assistant

## 2022-04-27 ENCOUNTER — Other Ambulatory Visit: Payer: Self-pay

## 2022-04-27 ENCOUNTER — Other Ambulatory Visit: Payer: Self-pay | Admitting: Nurse Practitioner

## 2022-04-27 ENCOUNTER — Other Ambulatory Visit (HOSPITAL_COMMUNITY)
Admission: RE | Admit: 2022-04-27 | Discharge: 2022-04-27 | Disposition: A | Discharge status: Home / Self Care | Payer: No Typology Code available for payment source | Source: Ambulatory Visit | Attending: Physician Assistant | Admitting: Physician Assistant

## 2022-04-27 VITALS — BP 115/75 | HR 75 | Wt 191.5 lb

## 2022-04-27 DIAGNOSIS — D649 Anemia, unspecified: Secondary | ICD-10-CM

## 2022-04-27 DIAGNOSIS — E559 Vitamin D deficiency, unspecified: Secondary | ICD-10-CM

## 2022-04-27 DIAGNOSIS — R102 Pelvic and perineal pain: Secondary | ICD-10-CM

## 2022-04-27 DIAGNOSIS — E785 Hyperlipidemia, unspecified: Secondary | ICD-10-CM

## 2022-04-27 DIAGNOSIS — N912 Amenorrhea, unspecified: Secondary | ICD-10-CM

## 2022-04-27 DIAGNOSIS — Z124 Encounter for screening for malignant neoplasm of cervix: Secondary | ICD-10-CM | POA: Diagnosis present

## 2022-04-27 DIAGNOSIS — R635 Abnormal weight gain: Secondary | ICD-10-CM

## 2022-04-27 DIAGNOSIS — R1030 Lower abdominal pain, unspecified: Secondary | ICD-10-CM

## 2022-04-27 DIAGNOSIS — R7309 Other abnormal glucose: Secondary | ICD-10-CM

## 2022-04-27 DIAGNOSIS — E669 Obesity, unspecified: Secondary | ICD-10-CM

## 2022-04-27 LAB — POCT URINE PREGNANCY: Preg Test, Ur: NEGATIVE

## 2022-04-27 MED ORDER — DOXYCYCLINE HYCLATE 100 MG PO TABS
100.0000 mg | ORAL_TABLET | Freq: Two times a day (BID) | ORAL | 0 refills | Status: DC
  Filled 2022-04-27: qty 20, 10d supply, fill #0

## 2022-04-27 NOTE — Progress Notes (Signed)
Patient ID: Debra Allen, female   DOB: 15-Dec-1985, 36 y.o.   MRN: [id number]   Debra Allen, is a 36 y.o. female  [id number]  [id number]  DOB - 26-Jul-1986  Chief Complaint  Patient presents with   Gynecologic Exam   Pelvic Pain       Subjective:   Debra Allen is a 36 y.o. female here today for pap smear.  She continues to have L sided pelvic pain since virtual visit with Zelda last month.  She is also having a lot of vaginal discharge and dyspareunia.  Wants to get pregnancy test done.  Periods irregular since removing IUD about 1 year ago.  Monogamous with husband.  No fever.  No vomiting or nausea  No problems updated.  ALLERGIES: Allergies  Allergen Reactions   Keflex [Cephalexin] Itching and Rash    PAST MEDICAL HISTORY: Past Medical History:  Diagnosis Date   Anxiety    Headache(784.0)    Migraine   Urinary tract infection     MEDICATIONS AT HOME: Prior to Admission medications   Medication Sig Start Date End Date Taking? Authorizing Provider  acetaminophen (TYLENOL) 500 MG tablet Take 500 mg by mouth every 6 (six) hours as needed for mild pain or moderate pain.    Yes [provider]  doxycycline (VIBRA-TABS) 100 MG tablet Take 1 tablet (100 mg total) by mouth 2 (two) times daily. 04/27/22  Yes Argentina Donovan, PA-C  famotidine (PEPCID) 40 MG tablet Take 1 tablet (40 mg total) by mouth daily. 06/24/21 07/24/21  Gildardo Pounds, NP  hydrOXYzine (ATARAX/VISTARIL) 10 MG tablet Take 1 tablet (10 mg total) by mouth 3 (three) times daily as needed. Patient not taking: Reported on 04/27/2022 06/24/21   Gildardo Pounds, NP    ROS: Neg HEENT Neg resp Neg cardiac Neg GI Neg MS Neg psych Neg neuro  Objective:   Vitals:   04/27/22 1418  BP: 115/75  Pulse: 75  SpO2: 98%  Weight: 191 lb 8.6 oz (86.9 kg)   Exam General appearance : Awake, alert, not in any distress. Speech Clear. Not toxic looking HEENT: Atraumatic  and Normocephalic Chest: Good air entry bilaterally, CTAB.  No rales/rhonchi/wheezing CVS: S1 S2 regular, no murmurs.  External GU WNL-speculum inserted-cervix friable; pap is taken.  There is a lot of yellowish discharge.  Bimanual with mild discomfort but no chandelier sign.   Extremities: B/L Lower Ext shows no edema, both legs are warm to touch Neurology: Awake alert, and oriented X 3, CN II-XII intact, Non focal Skin: No Rash  Data Review Lab Results  Component Value Date   HGBA1C 5.1 05/26/2021   HGBA1C 5.4 10/02/2017   HGBA1C 5.20 12/10/2015    Assessment & Plan   1. Pelvic pain Doxy sent due to discharge and friable cervix - US Pelvis Complete; Future - Cervicovaginal ancillary only  2. Screening for cervical cancer - Cytology - PAP([place])  3. Amenorrhea Pregnancy test negative - POCT urine pregnancy  Labs released and drawn from May 2023 visit.    Return if symptoms worsen or fail to improve.  The patient was given clear instructions to go to ER or return to medical center if symptoms don't improve, worsen or new problems develop. The patient verbalized understanding. The patient was told to call to get lab results if they haven't heard anything in the next week.      Freeman Caldron, PA-C [facility] and [facility] [place], [place]  04/27/2022, 2:37 PM

## 2022-04-27 NOTE — Patient Instructions (Addendum)
Dolor plvico en la mujer Pelvic Pain, Female El dolor plvico se siente en la parte inferior del abdomen, debajo del ombligo y a nivel de las caderas. El dolor puede comenzar en forma repentina (ser [employer]), reaparecer (ser recurrente) o durar mucho tiempo (volverse crnico). El dolor plvico que dura ms de 6 meses se considera crnico. El dolor plvico puede afectar: Los rganos genitales. El sistema urinario. El tubo digestivo. El sistema musculoesqueltico. El dolor plvico puede tener muchas causas posibles. A veces, el dolor puede ser una consecuencia de afecciones digestivas o urinarias, msculos o ligamentos distendidos, o afecciones del aparato reproductivo. A veces, la causa del dolor plvico no se conoce. Siga estas instrucciones en su casa:  Use los medicamentos de venta libre y los recetados solamente como se lo haya indicado el mdico. Haga reposo como se lo haya indicado el mdico. No tenga sexo si siente dolor. Lleve un registro del dolor plvico. Escriba los siguientes datos: Cundo comenz Conservation officer, historic buildings. La ubicacin del dolor. Qu parece mejorar o Occupational hygienist, como alimentos o su perodo mensual (ciclo menstrual). Cualquier sntoma que se presente junto con Conservation officer, historic buildings. Concurra a [place]. Esto es importante. Comunquese con un mdico si: El medicamento no [employer], o el dolor regresa. Aparecen nuevos sntomas. Tiene sangrado o secrecin vaginal anormal, lo que incluye sangrado despus de la menopausia. Tiene fiebre o escalofros. Tiene estreimiento. Observa sangre en la orina o en la materia fecal (heces). La orina tiene mal olor. Se siente dbil o siente que va a desvanecerse. Solicite ayuda de inmediato si: Tiene un dolor repentino e intenso. El dolor se intensifica de [place] continua. Siente dolor intenso junto con fiebre, nuseas, vmitos o sudoracin excesiva. Pierde la conciencia. Estos sntomas pueden representar un problema  grave que constituye Engineer, maintenance (IT). No espere a ver si los sntomas desaparecen. Solicite atencin mdica de inmediato. Comunquese con el servicio de emergencias de su localidad (911 en los [place]). No conduzca por sus propios medios [employer]. Resumen El dolor plvico se siente en la parte inferior del abdomen, debajo del ombligo y a nivel de las caderas. El dolor plvico puede tener muchas causas posibles. Lleve un registro del dolor plvico. Esta informacin no tiene Marine scientist el consejo del mdico. Asegrese de hacerle al mdico cualquier pregunta que tenga. Document Revised: 03/31/2021 Document Reviewed: 03/31/2021 Elsevier Patient Education  [place].

## 2022-04-28 ENCOUNTER — Other Ambulatory Visit: Payer: Self-pay

## 2022-04-28 LAB — CERVICOVAGINAL ANCILLARY ONLY
Bacterial Vaginitis (gardnerella): NEGATIVE
Candida Glabrata: NEGATIVE
Candida Vaginitis: NEGATIVE
Chlamydia: NEGATIVE
Comment: NEGATIVE
Comment: NEGATIVE
Comment: NEGATIVE
Comment: NEGATIVE
Comment: NEGATIVE
Comment: NORMAL
Neisseria Gonorrhea: NEGATIVE
Trichomonas: NEGATIVE

## 2022-04-28 LAB — URINALYSIS, COMPLETE
Bilirubin, UA: NEGATIVE
Glucose, UA: NEGATIVE
Ketones, UA: NEGATIVE
Leukocytes,UA: NEGATIVE
Nitrite, UA: NEGATIVE
Protein,UA: NEGATIVE
Specific Gravity, UA: 1.023 (ref 1.005–1.030)
Urobilinogen, Ur: 1 mg/dL (ref 0.2–1.0)
pH, UA: 8 — ABNORMAL HIGH (ref 5.0–7.5)

## 2022-04-28 LAB — CMP14+EGFR
ALT: 164 IU/L — ABNORMAL HIGH (ref 0–32)
AST: 75 IU/L — ABNORMAL HIGH (ref 0–40)
Albumin/Globulin Ratio: 1.8 (ref 1.2–2.2)
Albumin: 4.8 g/dL (ref 3.8–4.8)
Alkaline Phosphatase: 91 IU/L (ref 44–121)
BUN/Creatinine Ratio: 17 (ref 9–23)
BUN: 10 mg/dL (ref 6–20)
Bilirubin Total: 1 mg/dL (ref 0.0–1.2)
CO2: 22 mmol/L (ref 20–29)
Calcium: 10.3 mg/dL — ABNORMAL HIGH (ref 8.7–10.2)
Chloride: 100 mmol/L (ref 96–106)
Creatinine, Ser: 0.6 mg/dL (ref 0.57–1.00)
Globulin, Total: 2.6 g/dL (ref 1.5–4.5)
Glucose: 98 mg/dL (ref 70–99)
Potassium: 4.1 mmol/L (ref 3.5–5.2)
Sodium: 138 mmol/L (ref 134–144)
Total Protein: 7.4 g/dL (ref 6.0–8.5)
eGFR: 121 mL/min/{1.73_m2} (ref >59)

## 2022-04-28 LAB — CBC WITH DIFFERENTIAL/PLATELET
Basophils Absolute: 0 10*3/uL (ref 0.0–0.2)
Basos: 1 %
EOS (ABSOLUTE): 0.1 10*3/uL (ref 0.0–0.4)
Eos: 1 %
Hematocrit: 37.7 % (ref 34.0–46.6)
Hemoglobin: 12.8 g/dL (ref 11.1–15.9)
Immature Grans (Abs): 0.1 10*3/uL (ref 0.0–0.1)
Immature Granulocytes: 1 %
Lymphocytes Absolute: 2.3 10*3/uL (ref 0.7–3.1)
Lymphs: 29 %
MCH: 30 pg (ref 26.6–33.0)
MCHC: 34 g/dL (ref 31.5–35.7)
MCV: 89 fL (ref 79–97)
Monocytes Absolute: 0.5 10*3/uL (ref 0.1–0.9)
Monocytes: 6 %
Neutrophils Absolute: 5 10*3/uL (ref 1.4–7.0)
Neutrophils: 62 %
Platelets: 206 10*3/uL (ref 150–450)
RBC: 4.26 x10E6/uL (ref 3.77–5.28)
RDW: 13 % (ref 11.7–15.4)
WBC: 8 10*3/uL (ref 3.4–10.8)

## 2022-04-28 LAB — THYROID PANEL WITH TSH
Free Thyroxine Index: 2.7 (ref 1.2–4.9)
T3 Uptake Ratio: 28 % (ref 24–39)
T4, Total: 9.7 ug/dL (ref 4.5–12.0)
TSH: 1.4 u[IU]/mL (ref 0.450–4.500)

## 2022-04-28 LAB — MICROSCOPIC EXAMINATION
Bacteria, UA: NONE SEEN
Casts: NONE SEEN /lpf
WBC, UA: NONE SEEN /hpf (ref 0–5)

## 2022-04-28 LAB — LIPID PANEL
Chol/HDL Ratio: 3.5 ratio (ref 0.0–4.4)
Cholesterol, Total: 185 mg/dL (ref 100–199)
HDL: 53 mg/dL (ref >39)
LDL Chol Calc (NIH): 120 mg/dL — ABNORMAL HIGH (ref 0–99)
Triglycerides: 64 mg/dL (ref 0–149)
VLDL Cholesterol Cal: 12 mg/dL (ref 5–40)

## 2022-04-28 LAB — HEMOGLOBIN A1C
Est. average glucose Bld gHb Est-mCnc: 111 mg/dL
Hgb A1c MFr Bld: 5.5 % (ref 4.8–5.6)

## 2022-04-28 LAB — VITAMIN D 25 HYDROXY (VIT D DEFICIENCY, FRACTURES): Vit D, 25-Hydroxy: 8.3 ng/mL — ABNORMAL LOW (ref 30.0–100.0)

## 2022-04-28 MED ORDER — FAMOTIDINE 40 MG PO TABS
40.0000 mg | ORAL_TABLET | Freq: Every day | ORAL | 1 refills | Status: DC
  Filled 2022-04-28 – 2022-05-08 (×2): qty 30, 30d supply, fill #0

## 2022-04-28 NOTE — Telephone Encounter (Signed)
Requested Prescriptions  Pending Prescriptions Disp Refills  . famotidine (PEPCID) 40 MG tablet 30 tablet 0    Sig: Take 1 tablet (40 mg total) by mouth daily.     Gastroenterology:  [facility] Passed - 04/27/2022  5:29 PM      Passed - Valid encounter within last 12 months    Recent Outpatient Visits          Yesterday Pelvic pain   [place], [place], [place]   3 weeks ago Pelvic pain   [place], [place] W, NP   10 months ago Benign paroxysmal positional vertigo due to bilateral vestibular disorder   [place], [place] Buff, NP   2 years ago    [place]   3 years ago Chest pain, unspecified type   [place], [place], [place]

## 2022-05-01 ENCOUNTER — Other Ambulatory Visit: Payer: Self-pay | Admitting: Nurse Practitioner

## 2022-05-01 DIAGNOSIS — E559 Vitamin D deficiency, unspecified: Secondary | ICD-10-CM

## 2022-05-01 MED ORDER — VITAMIN D (ERGOCALCIFEROL) 1.25 MG (50000 UNIT) PO CAPS: 50000.0000 [IU] | ORAL_CAPSULE | ORAL | 1 refills | Status: DC

## 2022-05-03 LAB — CYTOLOGY - PAP
Comment: NEGATIVE
Diagnosis: NEGATIVE
Diagnosis: REACTIVE
High risk HPV: NEGATIVE

## 2022-05-05 ENCOUNTER — Other Ambulatory Visit: Payer: Self-pay

## 2022-05-08 ENCOUNTER — Other Ambulatory Visit: Payer: Self-pay

## 2022-05-09 ENCOUNTER — Ambulatory Visit (HOSPITAL_COMMUNITY)
Admission: RE | Admit: 2022-05-09 | Discharge: 2022-05-09 | Disposition: A | Discharge status: Home / Self Care | Payer: Self-pay | Source: Ambulatory Visit | Attending: Physician Assistant | Admitting: Physician Assistant

## 2022-05-09 ENCOUNTER — Other Ambulatory Visit: Payer: Self-pay

## 2022-05-09 DIAGNOSIS — R102 Pelvic and perineal pain: Secondary | ICD-10-CM | POA: Insufficient documentation

## 2022-12-26 ENCOUNTER — Ambulatory Visit: Payer: Self-pay

## 2022-12-26 NOTE — Telephone Encounter (Signed)
Call disconnected during transfer. Left message to call back about symptoms.

## 2023-01-11 ENCOUNTER — Encounter: Payer: Self-pay | Admitting: Physician Assistant

## 2023-01-11 ENCOUNTER — Other Ambulatory Visit: Payer: Self-pay | Admitting: Physician Assistant

## 2023-01-11 ENCOUNTER — Ambulatory Visit: Payer: Self-pay | Attending: Physician Assistant | Admitting: Physician Assistant

## 2023-01-11 ENCOUNTER — Encounter: Payer: Self-pay | Admitting: Gastroenterology

## 2023-01-11 ENCOUNTER — Other Ambulatory Visit: Payer: Self-pay

## 2023-01-11 VITALS — BP 114/78 | HR 71 | Ht 63.0 in | Wt 191.2 lb

## 2023-01-11 DIAGNOSIS — R1310 Dysphagia, unspecified: Secondary | ICD-10-CM

## 2023-01-11 DIAGNOSIS — H9319 Tinnitus, unspecified ear: Secondary | ICD-10-CM

## 2023-01-11 DIAGNOSIS — H6990 Unspecified Eustachian tube disorder, unspecified ear: Secondary | ICD-10-CM

## 2023-01-11 MED ORDER — AZITHROMYCIN 250 MG PO TABS
ORAL_TABLET | ORAL | 0 refills | Status: AC
  Filled 2023-01-11: qty 6, 5d supply, fill #0

## 2023-01-11 MED ORDER — FLUCONAZOLE 150 MG PO TABS
150.0000 mg | ORAL_TABLET | Freq: Once | ORAL | 0 refills | Status: AC
  Filled 2023-01-11: qty 1, 1d supply, fill #0

## 2023-01-11 MED ORDER — FAMOTIDINE 40 MG PO TABS
40.0000 mg | ORAL_TABLET | Freq: Every day | ORAL | 1 refills | Status: DC
  Filled 2023-01-11: qty 30, 30d supply, fill #0

## 2023-01-11 MED ORDER — CETIRIZINE HCL 10 MG PO TABS: 10.0000 mg | ORAL_TABLET | Freq: Every day | ORAL | 11 refills | Status: DC

## 2023-01-11 MED ORDER — FLUTICASONE PROPIONATE 50 MCG/ACT NA SUSP
2.0000 | Freq: Every day | NASAL | 6 refills | Status: DC
  Filled 2023-01-11: qty 16, 30d supply, fill #0

## 2023-01-11 NOTE — Progress Notes (Signed)
Patient ID: Debra Allen, female   DOB: [birth year], 37 y.o.   MRN: [id number]   Debra Allen, is a 37 y.o. female  [id number]  [id number]  DOB - 08/13/1986  Chief Complaint  Patient presents with   Sore Throat   Ear Pain   Tinnitus       Subjective:   Debra Allen is a 37 y.o. female here today R ear pain s/p having the flu.  Also having some "buzzing" in her ears.  No fever.    Prior to getting the flu she noticed she was having some trouble swallowing food and water at times.  She is no longer taking famotidine.  She has had cholecystectomy.  No N/V/D.  No change in stools.  No weight loss.  No early satiety. Some days it bothers her and some days it does not.   No problems updated.  ALLERGIES: Allergies  Allergen Reactions   Keflex [Cephalexin] Itching and Rash    PAST MEDICAL HISTORY: Past Medical History:  Diagnosis Date   Anxiety    Headache(784.0)    Migraine   Urinary tract infection     MEDICATIONS AT HOME: Prior to Admission medications   Medication Sig Start Date End Date Taking? Authorizing Provider  acetaminophen (TYLENOL) 500 MG tablet Take 500 mg by mouth every 6 (six) hours as needed for mild pain or moderate pain.    Yes [provider]  azithromycin (ZITHROMAX) 250 MG tablet Take 2 tablets on day 1, then 1 tablet daily on days 2 through 5 01/11/23 01/16/23 Yes Irena Gaydos, Dionne Bucy, PA-C  doxycycline (VIBRA-TABS) 100 MG tablet Take 1 tablet (100 mg total) by mouth 2 (two) times daily. 04/27/22  Yes Freeman Caldron M, PA-C  fluconazole (DIFLUCAN) 150 MG tablet Take 1 tablet (150 mg total) by mouth once for 1 dose. If needed for yeast infection after antibiotics 01/11/23 01/11/23 Yes Mayli Covington M, PA-C  fluticasone [facility]) 50 MCG/ACT nasal spray Place 2 sprays into both nostrils daily. 01/11/23  Yes Lezly Rumpf, Dionne Bucy, PA-C  Vitamin D, Ergocalciferol, (DRISDOL) 1.25 MG (50000 UNIT) CAPS capsule Take 1 capsule (50,000  Units total) by mouth every 7 (seven) days. 05/01/22  Yes Gildardo Pounds, NP  famotidine (PEPCID) 40 MG tablet Take 1 tablet (40 mg total) by mouth once daily. 01/11/23   Argentina Donovan, PA-C  hydrOXYzine (ATARAX/VISTARIL) 10 MG tablet Take 1 tablet (10 mg total) by mouth 3 (three) times daily as needed. 06/24/21   Gildardo Pounds, NP    ROS: Neg HEENT Neg resp Neg cardiac Neg GI Neg GU Neg MS Neg psych Neg neuro  Objective:   Vitals:   01/11/23 0859  BP: 114/78  Pulse: 71  SpO2: 98%  Weight: 191 lb 3.2 oz (86.7 kg)  Height: '5\' 3"'$  (1.6 m)   Exam General appearance : Awake, alert, not in any distress. Speech Clear. Not toxic looking HEENT: Atraumatic and Normocephalic.  B TM with fluid and R with erythema of the TM, throat is patent with PND.  No neck or thyroid swelling Neck: Supple, no JVD. No cervical lymphadenopathy.  Chest: Good air entry bilaterally, CTAB.  No rales/rhonchi/wheezing CVS: S1 S2 regular, no murmurs.  Extremities: B/L Lower Ext shows no edema, both legs are warm to touch Neurology: Awake alert, and oriented X 3, CN II-XII intact, Non focal Skin: No Rash  Data Review Lab Results  Component Value Date   HGBA1C 5.5 04/27/2022   HGBA1C 5.1 05/26/2021  HGBA1C 5.4 10/02/2017    Assessment & Plan   1. Tinnitus, unspecified laterality Sudafed or phenylephrine - fluticasone (FLONASE) 50 MCG/ACT nasal spray; Place 2 sprays into both nostrils daily.  Dispense: 16 g; Refill: 6  2. Dysphagia, unspecified type - Ambulatory referral to Gastroenterology  - famotidine (PEPCID) 40 MG tablet; Take 1 tablet (40 mg total) by mouth once daily.  Dispense: 30 tablet; Refill: 1  3. Dysfunction of Eustachian tube, unspecified laterality With infection on R - fluticasone (FLONASE) 50 MCG/ACT nasal spray; Place 2 sprays into both nostrils daily.  Dispense: 16 g; Refill: 6 - azithromycin (ZITHROMAX) 250 MG tablet; Take 2 tablets on day 1, then 1 tablet daily on days  2 through 5  Dispense: 6 tablet; Refill: 0    Return if symptoms worsen or fail to improve.  The patient was given clear instructions to go to ER or return to medical center if symptoms don't improve, worsen or new problems develop. The patient verbalized understanding. The patient was told to call to get lab results if they haven't heard anything in the next week.       Freeman Caldron, PA-C [facility] and [facility] [place], [place]   01/11/2023, 9:26 AM

## 2023-01-11 NOTE — Patient Instructions (Addendum)
Sudafed or phenylephrine decongestant  Eustachian Tube Dysfunction  Eustachian tube dysfunction refers to a condition in which a blockage develops in the narrow passage that connects the middle ear to the back of the nose (eustachian tube). The eustachian tube regulates air pressure in the middle ear by letting air move between the ear and nose. It also helps to drain fluid from the middle ear space. Eustachian tube dysfunction can affect one or both ears. When the eustachian tube does not function properly, air pressure, fluid, or both can build up in the middle ear. What are the causes? This condition occurs when the eustachian tube becomes blocked or cannot open normally. Common causes of this condition include: Ear infections. Colds and other infections that affect the nose, mouth, and throat (upper respiratory tract). Allergies. Irritation from cigarette smoke. Irritation from stomach acid coming up into the esophagus (gastroesophageal reflux). The esophagus is the part of the body that moves food from the mouth to the stomach. Sudden changes in air pressure, such as from descending in an airplane or scuba diving. Abnormal growths in the nose or throat, such as: Growths that line the nose (nasal polyps). Abnormal growth of cells (tumors). Enlarged tissue at the back of the throat (adenoids). What increases the risk? You are more likely to develop this condition if: You smoke. You are overweight. You are a child who has: Certain birth defects of the mouth, such as cleft palate. Large tonsils or adenoids. What are the signs or symptoms? Common symptoms of this condition include: A feeling of fullness in the ear. Ear pain. Clicking or popping noises in the ear. Ringing in the ear (tinnitus). Hearing loss. Loss of balance. Dizziness. Symptoms may get worse when the air pressure around you changes, such as when you travel to an area of high elevation, fly on an airplane, or go scuba  diving. How is this diagnosed? This condition may be diagnosed based on: Your symptoms. A physical exam of your ears, nose, and throat. Tests, such as those that measure: The movement of your eardrum. Your hearing (audiometry). How is this treated? Treatment depends on the cause and severity of your condition. In mild cases, you may relieve your symptoms by moving air into your ears. This is called "popping the ears." In more severe cases, or if you have symptoms of fluid in your ears, treatment may include: Medicines to relieve congestion (decongestants). Medicines that treat allergies (antihistamines). Nasal sprays or ear drops that contain medicines that reduce swelling (steroids). A procedure to drain the fluid in your eardrum. In this procedure, a small tube may be placed in the eardrum to: Drain the fluid. Restore the air in the middle ear space. A procedure to insert a balloon device through the nose to inflate the opening of the eustachian tube (balloon dilation). Follow these instructions at home: Lifestyle Do not do any of the following until your health care provider approves: Travel to high altitudes. Fly in airplanes. Work in a Pension scheme manager or room. Scuba dive. Do not use any products that contain nicotine or tobacco. These products include cigarettes, chewing tobacco, and vaping devices, such as e-cigarettes. If you need help quitting, ask your health care provider. Keep your ears dry. Wear fitted earplugs during showering and bathing. Dry your ears completely after. General instructions Take over-the-counter and prescription medicines only as told by your health care provider. Use techniques to help pop your ears as recommended by your health care provider. These may include: Chewing  gum. Yawning. Frequent, forceful swallowing. Closing your mouth, holding your nose closed, and gently blowing as if you are trying to blow air out of your nose. Keep all follow-up  visits. This is important. Contact a health care provider if: Your symptoms do not go away after treatment. Your symptoms come back after treatment. You are unable to pop your ears. You have: A fever. Pain in your ear. Pain in your head or neck. Fluid draining from your ear. Your hearing suddenly changes. You become very dizzy. You lose your balance. Get help right away if: You have a sudden, severe increase in any of your symptoms. Summary Eustachian tube dysfunction refers to a condition in which a blockage develops in the eustachian tube. It can be caused by ear infections, allergies, inhaled irritants, or abnormal growths in the nose or throat. Symptoms may include ear pain or fullness, hearing loss, or ringing in the ears. Mild cases are treated with techniques to unblock the ears, such as yawning or chewing gum. More severe cases are treated with medicines or procedures. This information is not intended to replace advice given to you by your health care provider. Make sure you discuss any questions you have with your health care provider. Document Revised: 01/10/2021 Document Reviewed: 01/10/2021 Elsevier Patient Education  [place].

## 2023-01-14 NOTE — Congregational Nurse Program (Signed)
Member seen at [facility].  Since she has orange card and is patient of [facility] and [facility]ness, referred her to make appt. Gave her number.  Vinnie Langton, RN, [place] Nurse, [phone].

## 2023-01-14 NOTE — Congregational Nurse Program (Signed)
Member seen at [facility].  She got an appt  at [place].  She has orange card.  Will followup.  Attended the workshop on depression given by Ferd Hibbs, CSWEI.  Vinnie Langton, RN, [place] Nurse, [phone].

## 2023-01-16 ENCOUNTER — Ambulatory Visit: Payer: Self-pay | Admitting: Gastroenterology

## 2023-01-16 NOTE — Progress Notes (Deleted)
Referring Provider: Argentina Donovan, PA-C Primary Care Physician:  Gildardo Pounds, NP   Reason for Consultation: Dysphagia   IMPRESSION:  Elevated transaminases  PLAN: ***   HPI: Debra Allen is a 37 y.o. female referred by [facility] for further evaluation of dysphagia.  The history is obtained through the patient and review of her electronic health record.  Prior cholecystectomy in 2021 for symptomatic cholelithiasis.  She has had intermittent dysphagia to solid food and water.  Previously taking famotidine.  Labs 04/27/2022 showed elevated transaminases with an AST of 75 and an ALT of 164, otherwise CMP and CBC are normal  Review of epic shows the ALT has been elevated all the way back to 2014.  It is ranged from 27-164.  There is no known family history of colon cancer or polyps. No family history of stomach cancer or other GI malignancy. No family history of inflammatory bowel disease or celiac.   Prior abdominal imaging includes: -Abdominal ultrasound in 2016 showed an echogenic liver and gallstones -Renal protocol CT showed extensive gallstones but was otherwise normal  Past Medical History:  Diagnosis Date   Anxiety    Headache(784.0)    Migraine   Urinary tract infection     Past Surgical History:  Procedure Laterality Date   CHOLECYSTECTOMY N/A 09/24/2020   Procedure: LAPAROSCOPIC CHOLECYSTECTOMY;  Surgeon: Jovita Kussmaul, MD;  Location: [place];  Service: General;  Laterality: N/A;   NO PAST SURGERIES      Current Outpatient Medications  Medication Sig Dispense Refill   cetirizine (ZYRTEC) 10 MG tablet Take 1 tablet (10 mg total) by mouth daily. 30 tablet 11   acetaminophen (TYLENOL) 500 MG tablet Take 500 mg by mouth every 6 (six) hours as needed for mild pain or moderate pain.      azithromycin (ZITHROMAX) 250 MG tablet Take 2 tablets on day 1, then 1 tablet daily on days 2 through 5 6 tablet 0   doxycycline (VIBRA-TABS) 100 MG tablet Take 1  tablet (100 mg total) by mouth 2 (two) times daily. 20 tablet 0   famotidine (PEPCID) 40 MG tablet Take 1 tablet (40 mg total) by mouth once daily. 30 tablet 1   fluticasone (FLONASE) 50 MCG/ACT nasal spray Place 2 sprays into both nostrils daily. 16 g 6   hydrOXYzine (ATARAX/VISTARIL) 10 MG tablet Take 1 tablet (10 mg total) by mouth 3 (three) times daily as needed. 60 tablet 0   Vitamin D, Ergocalciferol, (DRISDOL) 1.25 MG (50000 UNIT) CAPS capsule Take 1 capsule (50,000 Units total) by mouth every 7 (seven) days. 12 capsule 1   No current facility-administered medications for this visit.    Allergies as of 01/16/2023 - Review Complete 01/11/2023  Allergen Reaction Noted   Keflex [cephalexin] Itching and Rash 02/19/2013    Family History  Problem Relation Age of Onset   Diabetes Mother    Hypertension Maternal Grandmother    Diabetes Maternal Grandmother    Cancer Paternal Grandmother    Cancer Paternal Grandfather    Hypertension Father    Cancer Paternal Aunt        bladder cancer    Asthma Other    Obesity Other    Sleep apnea Other    Heart disease Other    Other Neg Hx     Social History   Socioeconomic History   Marital status: Single    Spouse name: Not on file   Number of children: Not on file  Years of education: Not on file   Highest education level: Not on file  Occupational History   Not on file  Tobacco Use   Smoking status: Never   Smokeless tobacco: Never  Vaping Use   Vaping Use: Never used  Substance and Sexual Activity   Alcohol use: No   Drug use: No   Sexual activity: Yes    Birth control/protection: I.U.D.  Other Topics Concern   Not on file  Social History Narrative   Not on file   Social Determinants of Health   Financial Resource Strain: Not on file  Food Insecurity: Not on file  Transportation Needs: Not on file  Physical Activity: Not on file  Stress: Not on file  Social Connections: Not on file  Intimate Partner Violence:  Not on file    Review of Systems: 12 system ROS is negative except as noted above.   Physical Exam: General:   Alert,  well-nourished, pleasant and cooperative in NAD Head:  Normocephalic and atraumatic. Eyes:  Sclera clear, no icterus.   Conjunctiva pink. Ears:  Normal auditory acuity. Nose:  No deformity, discharge,  or lesions. Mouth:  No deformity or lesions.   Neck:  Supple; no masses or thyromegaly. Lungs:  Clear throughout to auscultation.   No wheezes. Heart:  Regular rate and rhythm; no murmurs. Abdomen:  Soft, nontender, nondistended, normal bowel sounds, no rebound or guarding. No hepatosplenomegaly.   Rectal:  Deferred  Msk:  Symmetrical. No boney deformities LAD: No inguinal or umbilical LAD Extremities:  No clubbing or edema. Neurologic:  Alert and  oriented x4;  grossly nonfocal Skin:  Intact without significant lesions or rashes. Psych:  Alert and cooperative. Normal mood and affect.    Shiv Shuey L. Tarri Glenn, MD, MPH 01/16/2023, 1:56 PM

## 2023-01-27 NOTE — Congregational Nurse Program (Signed)
Member seen at [facility].  Saw her pcp at [place].  She has orange card.  Staff reports she got a job with [employer] in [place].  Very close to [facility] completion.  Vinnie Langton, RN, [place] Nurse, [phone].

## 2023-02-16 NOTE — Congregational Nurse Program (Signed)
Member attended Nutrition workshop and children attended session with me.  Children well behaved.  Very smart children.  Juliann Pulse, RN, Congregational Nurse, [phone]

## 2023-03-18 NOTE — Congregational Nurse Program (Signed)
Member seen at [facility].  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  Very close to [facility] completion.  Loves her job, loves helping the patients, Feels good about work Education officer, environmental.  May consider going on to school for CMA after GED completion.  Still working other job at [employer] and coming to school.  Encouraged her to keep focused.    Juliann Pulse, RN, Congregational Nurse, [phone].

## 2023-04-11 ENCOUNTER — Other Ambulatory Visit: Payer: Self-pay

## 2023-04-11 ENCOUNTER — Emergency Department (HOSPITAL_COMMUNITY): Payer: Self-pay

## 2023-04-11 ENCOUNTER — Emergency Department (HOSPITAL_COMMUNITY)
Admission: EM | Admit: 2023-04-11 | Discharge: 2023-04-12 | Disposition: A | Discharge status: Home / Self Care | Payer: Self-pay | Attending: Emergency Medicine | Admitting: Emergency Medicine

## 2023-04-11 ENCOUNTER — Encounter (HOSPITAL_COMMUNITY): Payer: Self-pay | Admitting: *Deleted

## 2023-04-11 DIAGNOSIS — N390 Urinary tract infection, site not specified: Secondary | ICD-10-CM | POA: Insufficient documentation

## 2023-04-11 DIAGNOSIS — R1032 Left lower quadrant pain: Secondary | ICD-10-CM

## 2023-04-11 DIAGNOSIS — R7401 Elevation of levels of liver transaminase levels: Secondary | ICD-10-CM | POA: Insufficient documentation

## 2023-04-11 LAB — URINALYSIS, ROUTINE W REFLEX MICROSCOPIC
Bilirubin Urine: NEGATIVE
Glucose, UA: NEGATIVE mg/dL
Hgb urine dipstick: NEGATIVE
Ketones, ur: NEGATIVE mg/dL
Nitrite: NEGATIVE
Protein, ur: NEGATIVE mg/dL
Specific Gravity, Urine: 1.023 (ref 1.005–1.030)
pH: 5 (ref 5.0–8.0)

## 2023-04-11 LAB — CBC
HCT: 36.9 % (ref 36.0–46.0)
Hemoglobin: 12.5 g/dL (ref 12.0–15.0)
MCH: 30.3 pg (ref 26.0–34.0)
MCHC: 33.9 g/dL (ref 30.0–36.0)
MCV: 89.6 fL (ref 80.0–100.0)
Platelets: 252 10*3/uL (ref 150–400)
RBC: 4.12 MIL/uL (ref 3.87–5.11)
RDW: 12.4 % (ref 11.5–15.5)
WBC: 8.8 10*3/uL (ref 4.0–10.5)
nRBC: 0 % (ref 0.0–0.2)

## 2023-04-11 LAB — COMPREHENSIVE METABOLIC PANEL
ALT: 54 U/L — ABNORMAL HIGH (ref 0–44)
AST: 27 U/L (ref 15–41)
Albumin: 4.2 g/dL (ref 3.5–5.0)
Alkaline Phosphatase: 59 U/L (ref 38–126)
Anion gap: 14 (ref 5–15)
BUN: 13 mg/dL (ref 6–20)
CO2: 23 mmol/L (ref 22–32)
Calcium: 9.9 mg/dL (ref 8.9–10.3)
Chloride: 100 mmol/L (ref 98–111)
Creatinine, Ser: 0.55 mg/dL (ref 0.44–1.00)
GFR, Estimated: >60 mL/min (ref >60)
Glucose, Bld: 108 mg/dL — ABNORMAL HIGH (ref 70–99)
Potassium: 3.7 mmol/L (ref 3.5–5.1)
Sodium: 137 mmol/L (ref 135–145)
Total Bilirubin: 1.6 mg/dL — ABNORMAL HIGH (ref 0.3–1.2)
Total Protein: 7.3 g/dL (ref 6.5–8.1)

## 2023-04-11 LAB — I-STAT BETA HCG BLOOD, ED (MC, WL, AP ONLY): I-stat hCG, quantitative: <5 m[IU]/mL (ref <5)

## 2023-04-11 LAB — LIPASE, BLOOD: Lipase: 38 U/L (ref 11–51)

## 2023-04-11 NOTE — ED Provider Notes (Signed)
[place] EMERGENCY DEPARTMENT AT [facility] Provider Note   CSN: [id number] Arrival date & time: 04/11/23  1726     History  Chief Complaint  Patient presents with   Abdominal Pain    Debra Allen is a 37 y.o. female.  37 year old female presents with complaint of suprapubic pain onset a week ago which radiates into her left lower quadrant and lower back area.  Not associated with nausea, vomiting, changes in bowel or bladder habits, vaginal discharge.  Notes that she has had irregular menstrual cycles and is concerned she may be pregnant.  Patient called her PCP but they were unable to see her for a week and she was concerned that if she could be pregnant she should not wait that long with her symptoms.  Notes that a few years ago she was told that she had a little bit of uterine prolapse and that her doctor was going to monitor this however she has not followed up with her doctors office.        Home Medications Prior to Admission medications   Medication Sig Start Date End Date Taking? Authorizing Provider  cetirizine (ZYRTEC) 10 MG tablet Take 1 tablet (10 mg total) by mouth daily. 01/11/23   Anders Simmonds, PA-C  sulfamethoxazole-trimethoprim (BACTRIM DS) 800-160 MG tablet Take 1 tablet by mouth 2 (two) times daily for 5 days. 04/12/23 04/17/23 Yes Jeannie Fend, PA-C  acetaminophen (TYLENOL) 500 MG tablet Take 500 mg by mouth every 6 (six) hours as needed for mild pain or moderate pain.     [provider]  famotidine (PEPCID) 40 MG tablet Take 1 tablet (40 mg total) by mouth once daily. 01/11/23   Anders Simmonds, PA-C  fluticasone (FLONASE) 50 MCG/ACT nasal spray Place 2 sprays into both nostrils daily. 01/11/23   Anders Simmonds, PA-C  hydrOXYzine (ATARAX/VISTARIL) 10 MG tablet Take 1 tablet (10 mg total) by mouth 3 (three) times daily as needed. 06/24/21   Claiborne Rigg, NP  Vitamin D, Ergocalciferol, (DRISDOL) 1.25 MG (50000 UNIT) CAPS  capsule Take 1 capsule (50,000 Units total) by mouth every 7 (seven) days. 05/01/22   Claiborne Rigg, NP      Allergies    Keflex [cephalexin]    Review of Systems   Review of Systems Negative except as per HPI Physical Exam Updated Vital Signs BP 119/87 (BP Location: Right Arm)   Pulse 73   Temp 97.7 F (36.5 C) (Oral)   Resp 17   Ht 5\' 3"  (1.6 m)   Wt 86.7 kg   SpO2 100%   BMI 33.86 kg/m  Physical Exam Vitals and nursing note reviewed.  Constitutional:      General: She is not in acute distress.    Appearance: She is well-developed. She is not diaphoretic.  HENT:     Head: Normocephalic and atraumatic.  Cardiovascular:     Rate and Rhythm: Normal rate and regular rhythm.     Heart sounds: Normal heart sounds.  Pulmonary:     Effort: Pulmonary effort is normal.     Breath sounds: Normal breath sounds.  Abdominal:     Palpations: Abdomen is soft.     Tenderness: There is abdominal tenderness in the right lower quadrant, suprapubic area and left lower quadrant. There is no right CVA tenderness or left CVA tenderness.  Skin:    General: Skin is warm and dry.     Findings: No erythema or rash.  Neurological:  Mental Status: She is alert and oriented to person, place, and time.  Psychiatric:        Behavior: Behavior normal.     ED Results / Procedures / Treatments   Labs (all labs ordered are listed, but only abnormal results are displayed) Labs Reviewed  COMPREHENSIVE METABOLIC PANEL - Abnormal; Notable for the following components:      Result Value   Glucose, Bld 108 (*)    ALT 54 (*)    Total Bilirubin 1.6 (*)    All other components within normal limits  URINALYSIS, ROUTINE W REFLEX MICROSCOPIC - Abnormal; Notable for the following components:   Leukocytes,Ua TRACE (*)    Bacteria, UA RARE (*)    All other components within normal limits  LIPASE, BLOOD  CBC  I-STAT BETA HCG BLOOD, ED ([facility], [facility], [facility] ONLY)    EKG EKG  Interpretation  Date/Time:  Wednesday Apr 11 2023 17:53:16 EDT Ventricular Rate:  76 PR Interval:  144 QRS Duration: 90 QT Interval:  376 QTC Calculation: 423 R Axis:   44 Text Interpretation: Normal sinus rhythm Normal ECG Confirmed by Zadie Rhine ([id number]) on 04/12/2023 12:21:39 AM  Radiology US Pelvis Complete  Result Date: 04/12/2023 CLINICAL DATA:  Left lower quadrant pain. EXAM: TRANSABDOMINAL AND TRANSVAGINAL ULTRASOUND OF PELVIS DOPPLER ULTRASOUND OF OVARIES TECHNIQUE: Both transabdominal and transvaginal ultrasound examinations of the pelvis were performed. Transabdominal technique was performed for global imaging of the pelvis including uterus, ovaries, adnexal regions, and pelvic cul-de-sac. It was necessary to proceed with endovaginal exam following the transabdominal exam to visualize the uterus, endometrium, bilateral ovaries and bilateral adnexa. Color and duplex Doppler ultrasound was utilized to evaluate blood flow to the ovaries. COMPARISON:  None Available. FINDINGS: Uterus Measurements: 10.8 cm x 6.1 cm x 6.6 cm = volume: 224.07 mL. No fibroids or other mass visualized. Endometrium Thickness: 9.52 mm.  No focal abnormality visualized. Right ovary Measurements: 3.9 cm x 2.6 cm x 3.6 cm = volume: 18.71 mL. A 2.1 cm x 1.3 cm x 2.1 cm simple right ovarian cyst is seen. Left ovary Measurements: 3.5 cm x 2.1 cm x 2.6 cm = volume: 10.32 mL. Normal appearance/no adnexal mass. Pulsed Doppler evaluation of both ovaries demonstrates normal low-resistance arterial and venous waveforms. Other findings No abnormal free fluid. IMPRESSION: 2.1 cm x 1.3 cm x 2.1 cm simple right ovarian cyst. Electronically Signed   By: Aram Candela M.D.   On: 04/12/2023 00:16   US Transvaginal Non-OB  Result Date: 04/12/2023 CLINICAL DATA:  Left lower quadrant pain. EXAM: TRANSABDOMINAL AND TRANSVAGINAL ULTRASOUND OF PELVIS DOPPLER ULTRASOUND OF OVARIES TECHNIQUE: Both transabdominal and transvaginal  ultrasound examinations of the pelvis were performed. Transabdominal technique was performed for global imaging of the pelvis including uterus, ovaries, adnexal regions, and pelvic cul-de-sac. It was necessary to proceed with endovaginal exam following the transabdominal exam to visualize the uterus, endometrium, bilateral ovaries and bilateral adnexa. Color and duplex Doppler ultrasound was utilized to evaluate blood flow to the ovaries. COMPARISON:  None Available. FINDINGS: Uterus Measurements: 10.8 cm x 6.1 cm x 6.6 cm = volume: 224.07 mL. No fibroids or other mass visualized. Endometrium Thickness: 9.52 mm.  No focal abnormality visualized. Right ovary Measurements: 3.9 cm x 2.6 cm x 3.6 cm = volume: 18.71 mL. A 2.1 cm x 1.3 cm x 2.1 cm simple right ovarian cyst is seen. Left ovary Measurements: 3.5 cm x 2.1 cm x 2.6 cm = volume: 10.32 mL. Normal appearance/no adnexal mass. Pulsed  Doppler evaluation of both ovaries demonstrates normal low-resistance arterial and venous waveforms. Other findings No abnormal free fluid. IMPRESSION: 2.1 cm x 1.3 cm x 2.1 cm simple right ovarian cyst. Electronically Signed   By: Aram Candela M.D.   On: 04/12/2023 00:16   [place] Art/Ven Flow Abd Pelv Doppler  Result Date: 04/12/2023 CLINICAL DATA:  Left lower quadrant pain. EXAM: TRANSABDOMINAL AND TRANSVAGINAL ULTRASOUND OF PELVIS DOPPLER ULTRASOUND OF OVARIES TECHNIQUE: Both transabdominal and transvaginal ultrasound examinations of the pelvis were performed. Transabdominal technique was performed for global imaging of the pelvis including uterus, ovaries, adnexal regions, and pelvic cul-de-sac. It was necessary to proceed with endovaginal exam following the transabdominal exam to visualize the uterus, endometrium, bilateral ovaries and bilateral adnexa. Color and duplex Doppler ultrasound was utilized to evaluate blood flow to the ovaries. COMPARISON:  None Available. FINDINGS: Uterus Measurements: 10.8 cm x 6.1 cm x 6.6 cm  = volume: 224.07 mL. No fibroids or other mass visualized. Endometrium Thickness: 9.52 mm.  No focal abnormality visualized. Right ovary Measurements: 3.9 cm x 2.6 cm x 3.6 cm = volume: 18.71 mL. A 2.1 cm x 1.3 cm x 2.1 cm simple right ovarian cyst is seen. Left ovary Measurements: 3.5 cm x 2.1 cm x 2.6 cm = volume: 10.32 mL. Normal appearance/no adnexal mass. Pulsed Doppler evaluation of both ovaries demonstrates normal low-resistance arterial and venous waveforms. Other findings No abnormal free fluid. IMPRESSION: 2.1 cm x 1.3 cm x 2.1 cm simple right ovarian cyst. Electronically Signed   By: Aram Candela M.D.   On: 04/12/2023 00:16    Procedures Procedures    Medications Ordered in ED Medications - No data to display  ED Course/ Medical Decision Making/ A&P                             Medical Decision Making Amount and/or Complexity of Data Reviewed Radiology: ordered.  Risk Prescription drug management.   This patient presents to the ED for concern of LLQ/suprapubic pain, this involves an extensive number of treatment options, and is a complaint that carries with it a high risk of complications and morbidity.  The differential diagnosis includes but not limited to ovarian cyst, UTI, colitis, diverticulitis, kidney stone   Co morbidities that complicate the patient evaluation  Headaches, anxiety, history of uterine prolapse   Additional history obtained:  External records from outside source obtained and reviewed including visit to PCP dated 04/27/2022 for left-sided pelvic pain.   Lab Tests:  I Ordered, and personally interpreted labs.  The pertinent results include: hCG negative.  Lipase within normal notes.  CBC unremarkable.  CMP with mildly elevated bilirubin at 1.6, mildly elevated ALT at 54.  Urinalysis with trace leukocytes and rare bacteria   Imaging Studies ordered:  I ordered imaging studies including ultrasound pelvis with Doppler I independently  visualized and interpreted imaging which showed normal flow to the ovaries I agree with the radiologist interpretation small right ovarian cyst   Problem List / ED Course / Critical interventions / Medication management  37 year old female with concern for suprapubic to left lower quadrant discomfort as above.  History of similar, told due to uterine prolapse but has not followed up for this.  On exam, abdomen soft, has mild general lower abdominal discomfort.  Reports monogamous relationship without concerns for STDs.  Labs are reassuring.  Ultrasound shows flow to both ovaries, small right ovarian cyst.  Plan is to cover  with antibiotics for possible UTI based on urinalysis.  Recommend recheck with PCP in 24 to 48 hours if not improving with antibiotics. I have reviewed the patients home medicines and have made adjustments as needed   Social Determinants of Health:  Has PCP for follow-up   Test / Admission - Considered:  Consider CT abdomen pelvis however with stable labs and reassuring exam, felt stable for discharge for outpatient follow-up.         Final Clinical Impression(s) / ED Diagnoses Final diagnoses:  Left lower quadrant abdominal pain  Urinary tract infection in female    Rx / DC Orders ED Discharge Orders          Ordered    sulfamethoxazole-trimethoprim (BACTRIM DS) 800-160 MG tablet  2 times daily        04/12/23 0033              Jeannie Fend, PA-C 04/12/23 1610    Rolan Bucco, MD 04/13/23 [id number]

## 2023-04-11 NOTE — ED Triage Notes (Signed)
The pt is c/o and pain with nausea for one week  she works downstairs and left work to check in because she reports that the pain is getting too strong she also reports that when she wipes after she urinates there is blood coming out  lmp irregular

## 2023-04-12 MED ORDER — SULFAMETHOXAZOLE-TRIMETHOPRIM 800-160 MG PO TABS: 1.0000 | ORAL_TABLET | Freq: Two times a day (BID) | ORAL | 0 refills | Status: AC

## 2023-04-12 NOTE — Discharge Instructions (Signed)
Antibiotics as prescribed and complete the full course. Return to the ER for worsening or concerning symptoms. Recheck with your doctor if not improving in 24-48 hours.

## 2023-05-30 ENCOUNTER — Ambulatory Visit: Payer: Self-pay | Admitting: Physician Assistant

## 2023-06-28 ENCOUNTER — Other Ambulatory Visit: Payer: Self-pay

## 2023-06-28 ENCOUNTER — Ambulatory Visit: Payer: Self-pay | Admitting: *Deleted

## 2023-06-28 ENCOUNTER — Ambulatory Visit: Payer: Self-pay | Admitting: Physician Assistant

## 2023-06-28 ENCOUNTER — Other Ambulatory Visit (HOSPITAL_COMMUNITY)
Admission: RE | Admit: 2023-06-28 | Discharge: 2023-06-28 | Disposition: A | Discharge status: Home / Self Care | Payer: Self-pay | Source: Ambulatory Visit | Attending: Physician Assistant | Admitting: Physician Assistant

## 2023-06-28 ENCOUNTER — Encounter: Payer: Self-pay | Admitting: Physician Assistant

## 2023-06-28 VITALS — BP 108/73 | HR 111 | Temp 99.0°F | Ht 61.0 in | Wt 184.0 lb

## 2023-06-28 DIAGNOSIS — R110 Nausea: Secondary | ICD-10-CM

## 2023-06-28 DIAGNOSIS — R102 Pelvic and perineal pain: Secondary | ICD-10-CM | POA: Insufficient documentation

## 2023-06-28 DIAGNOSIS — Z3202 Encounter for pregnancy test, result negative: Secondary | ICD-10-CM

## 2023-06-28 DIAGNOSIS — B9689 Other specified bacterial agents as the cause of diseases classified elsewhere: Secondary | ICD-10-CM

## 2023-06-28 DIAGNOSIS — N390 Urinary tract infection, site not specified: Secondary | ICD-10-CM

## 2023-06-28 DIAGNOSIS — N760 Acute vaginitis: Secondary | ICD-10-CM

## 2023-06-28 LAB — POCT URINALYSIS DIP (CLINITEK)
Bilirubin, UA: NEGATIVE
Glucose, UA: NEGATIVE mg/dL
Nitrite, UA: NEGATIVE
POC PROTEIN,UA: NEGATIVE
Spec Grav, UA: 1.015 (ref 1.010–1.025)
Urobilinogen, UA: 0.2 E.U./dL
pH, UA: 6 (ref 5.0–8.0)

## 2023-06-28 LAB — POCT URINE PREGNANCY: Preg Test, Ur: NEGATIVE

## 2023-06-28 MED ORDER — CIPROFLOXACIN HCL 500 MG PO TABS
500.0000 mg | ORAL_TABLET | Freq: Two times a day (BID) | ORAL | 0 refills | Status: AC
Start: 2023-06-28 — End: 2023-07-05
  Filled 2023-06-28: qty 14, 7d supply, fill #0

## 2023-06-28 MED ORDER — ONDANSETRON 8 MG PO TBDP
8.0000 mg | ORAL_TABLET | Freq: Three times a day (TID) | ORAL | 0 refills | Status: DC | PRN
Start: 2023-06-28 — End: 2023-12-20
  Filled 2023-06-28: qty 20, 7d supply, fill #0

## 2023-06-28 NOTE — Telephone Encounter (Signed)
noted 

## 2023-06-28 NOTE — Patient Instructions (Signed)
You are going to take Cipro twice a day for 7 days, I also sent a prescription of Zofran to your pharmacy to help with your nausea.  I encourage you to get plenty of rest and stay very well-hydrated.  We will call you with today's lab results.  Roney Jaffe, PA-C Physician Assistant [facility] Medicine [email or web address]   Urinary Tract Infection, Adult  A urinary tract infection (UTI) is an infection of any part of the urinary tract. The urinary tract includes the kidneys, ureters, bladder, and urethra. These organs make, store, and get rid of urine in the body. An upper UTI affects the ureters and kidneys. A lower UTI affects the bladder and urethra. What are the causes? Most urinary tract infections are caused by bacteria in your genital area around your urethra, where urine leaves your body. These bacteria grow and cause inflammation of your urinary tract. What increases the risk? You are more likely to develop this condition if: You have a urinary catheter that stays in place. You are not able to control when you urinate or have a bowel movement (incontinence). You are female and you: Use a spermicide or diaphragm for birth control. Have low estrogen levels. Are pregnant. You have certain genes that increase your risk. You are sexually active. You take antibiotic medicines. You have a condition that causes your flow of urine to slow down, such as: An enlarged prostate, if you are female. Blockage in your urethra. A kidney stone. A nerve condition that affects your bladder control (neurogenic bladder). Not getting enough to drink, or not urinating often. You have certain medical conditions, such as: Diabetes. A weak disease-fighting system (immunesystem). Sickle cell disease. Gout. Spinal cord injury. What are the signs or symptoms? Symptoms of this condition include: Needing to urinate right away (urgency). Frequent  urination. This may include small amounts of urine each time you urinate. Pain or burning with urination. Blood in the urine. Urine that smells bad or unusual. Trouble urinating. Cloudy urine. Vaginal discharge, if you are female. Pain in the abdomen or the lower back. You may also have: Vomiting or a decreased appetite. Confusion. Irritability or tiredness. A fever or chills. Diarrhea. The first symptom in older adults may be confusion. In some cases, they may not have any symptoms until the infection has worsened. How is this diagnosed? This condition is diagnosed based on your medical history and a physical exam. You may also have other tests, including: Urine tests. Blood tests. Tests for STIs (sexually transmitted infections). If you have had more than one UTI, a cystoscopy or imaging studies may be done to determine the cause of the infections. How is this treated? Treatment for this condition includes: Antibiotic medicine. Over-the-counter medicines to treat discomfort. Drinking enough water to stay hydrated. If you have frequent infections or have other conditions such as a kidney stone, you may need to see a health care provider who specializes in the urinary tract (urologist). In rare cases, urinary tract infections can cause sepsis. Sepsis is a life-threatening condition that occurs when the body responds to an infection. Sepsis is treated in the hospital with IV antibiotics, fluids, and other medicines. Follow these instructions at home:  Medicines Take over-the-counter and prescription medicines only as told by your health care provider. If you were prescribed an antibiotic medicine, take it as told by your health care provider. Do not stop using the antibiotic even if you start to feel better. General instructions Make sure you: Empty  your bladder often and completely. Do not hold urine for long periods of time. Empty your bladder after sex. Wipe from front to back  after urinating or having a bowel movement if you are female. Use each tissue only one time when you wipe. Drink enough fluid to keep your urine pale yellow. Keep all follow-up visits. This is important. Contact a health care provider if: Your symptoms do not get better after 1-2 days. Your symptoms go away and then return. Get help right away if: You have severe pain in your back or your lower abdomen. You have a fever or chills. You have nausea or vomiting. Summary A urinary tract infection (UTI) is an infection of any part of the urinary tract, which includes the kidneys, ureters, bladder, and urethra. Most urinary tract infections are caused by bacteria in your genital area. Treatment for this condition often includes antibiotic medicines. If you were prescribed an antibiotic medicine, take it as told by your health care provider. Do not stop using the antibiotic even if you start to feel better. Keep all follow-up visits. This is important. This information is not intended to replace advice given to you by your health care provider. Make sure you discuss any questions you have with your health care provider. Document Revised: 06/06/2020 Document Reviewed: 06/11/2020 Elsevier Patient Education  2024 [employer].

## 2023-06-28 NOTE — Progress Notes (Signed)
Established Patient Office Visit  Subjective   Patient ID: Debra Allen, female    DOB: [birth year]  Age: 37 y.o. MRN: [id number]  Chief Complaint  Patient presents with   Abdominal Pain    Feeling swollen, nausea, pain is worsening    Vaginal Discharge    Creamy discharge   Headache    States that she has been experiencing suprapubic pain and cramping for the past week.  States that she feels it has gotten worse over the past 3 days.  States that she has also been experiencing chills and nausea.  States the pain was intense last night and made it difficult for her to sleep.  States that she also started having lower right back pain for the past 3 days as well.  Endorse frontal headache for the past 3 days as well.  Had tried ibuprofen with little relief.  States that she was treated for urinary tract infection in May and that this feels very similar.  States that she had some white creamy discharge, without odor, at the beginning of but that has changed to just a watery type discharge.  Denies itching or dysuria.    Past Medical History:  Diagnosis Date   Anxiety    Headache(784.0)    Migraine   Urinary tract infection    Social History   Socioeconomic History   Marital status: Single    Spouse name: Not on file   Number of children: Not on file   Years of education: Not on file   Highest education level: Not on file  Occupational History   Not on file  Tobacco Use   Smoking status: Never   Smokeless tobacco: Never  Vaping Use   Vaping status: Never Used  Substance and Sexual Activity   Alcohol use: No   Drug use: No   Sexual activity: Yes    Birth control/protection: I.U.D.  Other Topics Concern   Not on file  Social History Narrative   Not on file   Social Determinants of Health   Financial Resource Strain: Not on file  Food Insecurity: Not on file  Transportation Needs: Not on file  Physical Activity: Not on file  Stress: Not on file   Social Connections: Not on file  Intimate Partner Violence: Not on file   Family History  Problem Relation Age of Onset   Diabetes Mother    Hypertension Maternal Grandmother    Diabetes Maternal Grandmother    Cancer Paternal Grandmother    Cancer Paternal Grandfather    Hypertension Father    Cancer Paternal Aunt        bladder cancer    Asthma Other    Obesity Other    Sleep apnea Other    Heart disease Other    Other Neg Hx    Allergies  Allergen Reactions   Keflex [Cephalexin] Itching and Rash    Review of Systems  Constitutional:  Positive for chills and fever.  HENT: Negative.    Eyes: Negative.   Respiratory:  Negative for shortness of breath.   Cardiovascular:  Negative for chest pain.  Gastrointestinal:  Positive for abdominal pain and nausea. Negative for vomiting.  Genitourinary:  Positive for urgency. Negative for dysuria and frequency.  Musculoskeletal:  Positive for back pain.  Skin: Negative.   Neurological:  Positive for headaches.  Endo/Heme/Allergies: Negative.   Psychiatric/Behavioral: Negative.        Objective:     BP 108/73 (BP Location: Left Arm,  Patient Position: Sitting, Cuff Size: Large)   Pulse (!) 111   Temp 99 F (37.2 C)   Ht 5\' 1"  (1.549 m)   Wt 184 lb (83.5 kg)   LMP 05/13/2023   SpO2 96%   BMI 34.77 kg/m  BP Readings from Last 3 Encounters:  06/28/23 108/73  04/11/23 119/87  01/11/23 114/78   Wt Readings from Last 3 Encounters:  06/28/23 184 lb (83.5 kg)  04/11/23 191 lb 2.2 oz (86.7 kg)  01/11/23 191 lb 3.2 oz (86.7 kg)      Physical Exam Vitals and nursing note reviewed.  Constitutional:      Appearance: Normal appearance.  HENT:     Head: Normocephalic and atraumatic.     Right Ear: External ear normal.     Left Ear: External ear normal.     Nose: Nose normal.     Mouth/Throat:     Mouth: Mucous membranes are moist.     Pharynx: Oropharynx is clear.  Eyes:     Extraocular Movements: Extraocular  movements intact.     Conjunctiva/sclera: Conjunctivae normal.     Pupils: Pupils are equal, round, and reactive to light.  Cardiovascular:     Rate and Rhythm: Regular rhythm. Tachycardia present.     Heart sounds: Normal heart sounds.  Pulmonary:     Effort: Pulmonary effort is normal.     Breath sounds: Normal breath sounds.  Abdominal:     Tenderness: There is abdominal tenderness in the suprapubic area. There is right CVA tenderness and left CVA tenderness.  Musculoskeletal:        General: Normal range of motion.     Cervical back: Normal range of motion and neck supple.  Skin:    General: Skin is warm and dry.  Neurological:     General: No focal deficit present.     Mental Status: She is alert and oriented to person, place, and time.  Psychiatric:        Mood and Affect: Mood normal.        Behavior: Behavior normal.        Thought Content: Thought content normal.        Judgment: Judgment normal.          Assessment & Plan:   Problem List Items Addressed This Visit   None Visit Diagnoses     Complicated UTI (urinary tract infection)    -  Primary   Relevant Medications   ciprofloxacin (CIPRO) 500 MG tablet   Other Relevant Orders   Cervicovaginal ancillary only   POCT URINALYSIS DIP (CLINITEK) (Completed)   Urine Culture   Nausea without vomiting       Relevant Medications   ondansetron (ZOFRAN-ODT) 8 MG disintegrating tablet   Pregnancy test negative       Relevant Orders   POCT urine pregnancy (Completed)     1. Complicated UTI (urinary tract infection) UA positive for leukocytes will treat based on clinical presentation.  Unable to give Rocephin injection due to allergy.  Trial Cipro.  Patient education given on supportive care.  Red flags given for prompt reevaluation - Cervicovaginal ancillary only - POCT URINALYSIS DIP (CLINITEK) - Urine Culture - ciprofloxacin (CIPRO) 500 MG tablet; Take 1 tablet (500 mg total) by mouth 2 (two) times daily for  7 days.  Dispense: 14 tablet; Refill: 0  2. Nausea without vomiting Trial Zofran - ondansetron (ZOFRAN-ODT) 8 MG disintegrating tablet; Dissolve 1 tablet (8 mg total) by mouth every  8 (eight) hours as needed for nausea or vomiting.  Dispense: 20 tablet; Refill: 0  3. Pregnancy test negative  - POCT urine pregnancy   I have reviewed the patient's medical history (PMH, PSH, Social History, Family History, Medications, and allergies) , and have been updated if relevant. I spent 30 minutes reviewing chart and  face to face time with patient.     Return if symptoms worsen or fail to improve.    Kasandra Knudsen Mayers, PA-C

## 2023-06-28 NOTE — Telephone Encounter (Signed)
  Chief Complaint: Abdominal Pain Symptoms: Lower abdominal pain, cramping, radiates to back. Bladder area distended, pressure. Dysuria, urine cloudy. Reports vaginal discharge with odor. Frequency: 1 week Pertinent Negatives: Patient denies fever Disposition: [] ED /[] Urgent Care (no appt availability in office) / [] Appointment(In office/virtual)/ []  [facility]/ [] Home Care/ [] Refused Recommended Disposition /[x] [facility]/ []  Follow-up with PCP Additional Notes: No availability at practice, advised mobile Clinic. States will follow disposition. Care advise provided, verbalizes understanding. Reason for Disposition  Unusual vaginal discharge (e.g., bad smelling, yellow, green, or foamy-white)  Answer Assessment - Initial Assessment Questions 1. LOCATION: "Where does it hurt?"      Lower abdomen 2. RADIATION: "Does the pain shoot anywhere else?" (e.g., chest, back)     Lower back 3. ONSET: "When did the pain begin?" (e.g., minutes, hours or days ago)      1 week ago 4. SUDDEN: "Gradual or sudden onset?"     NA 5. PATTERN "Does the pain come and go, or is it constant?"    - If it comes and goes: "How long does it last?" "Do you have pain now?"     (Note: Comes and goes means the pain is intermittent. It goes away completely between bouts.)    - If constant: "Is it getting better, staying the same, or getting worse?"      (Note: Constant means the pain never goes away completely; most serious pain is constant and gets worse.)      *No Answer* 6. SEVERITY: "How bad is the pain?"  (e.g., Scale 1-10; mild, moderate, or severe)    - MILD (1-3): Doesn't interfere with normal activities, abdomen soft and not tender to touch.     - MODERATE (4-7): Interferes with normal activities or awakens from sleep, abdomen tender to touch.     - SEVERE (8-10): Excruciating pain, doubled over, unable to do any normal activities.       Severe at times 7. RECURRENT SYMPTOM: "Have you  ever had this type of stomach pain before?" If Yes, ask: "When was the last time?" and "What happened that time?"      No 8. CAUSE: "What do you think is causing the stomach pain?"     Unsure 9. RELIEVING/AGGRAVATING FACTORS: "What makes it better or worse?" (e.g., antacids, bending or twisting motion, bowel movement)     Voiding 10. OTHER SYMPTOMS: "Do you have any other symptoms?" (e.g., back pain, diarrhea, fever, urination pain, vomiting)       Dysuria, pelvic cramping, nausea,urine cloudy, odor,vaginal discharge, whitish  Protocols used: Abdominal Pain - Female-A-AH

## 2023-06-29 LAB — CERVICOVAGINAL ANCILLARY ONLY
Bacterial Vaginitis (gardnerella): POSITIVE — AB
Candida Glabrata: NEGATIVE
Candida Vaginitis: NEGATIVE
Chlamydia: NEGATIVE
Comment: NEGATIVE
Comment: NEGATIVE
Comment: NEGATIVE
Comment: NEGATIVE
Comment: NEGATIVE
Comment: NORMAL
Neisseria Gonorrhea: NEGATIVE
Trichomonas: NEGATIVE

## 2023-06-30 LAB — URINE CULTURE

## 2023-07-02 ENCOUNTER — Other Ambulatory Visit: Payer: Self-pay

## 2023-07-02 MED ORDER — METRONIDAZOLE 500 MG PO TABS
500.0000 mg | ORAL_TABLET | Freq: Two times a day (BID) | ORAL | 0 refills | Status: AC
Start: 2023-07-02 — End: 2023-07-17
  Filled 2023-07-02 – 2023-07-10 (×2): qty 14, 7d supply, fill #0

## 2023-07-02 NOTE — Addendum Note (Signed)
Addended by: Roney Jaffe on: 07/02/2023 10:21 AM   Modules accepted: Orders

## 2023-07-09 ENCOUNTER — Other Ambulatory Visit: Payer: Self-pay

## 2023-07-10 ENCOUNTER — Other Ambulatory Visit: Payer: Self-pay

## 2023-09-19 ENCOUNTER — Telehealth: Payer: Self-pay | Admitting: Physician Assistant

## 2023-09-19 ENCOUNTER — Other Ambulatory Visit: Payer: Self-pay

## 2023-09-19 ENCOUNTER — Ambulatory Visit: Payer: Self-pay

## 2023-09-19 ENCOUNTER — Encounter: Payer: Self-pay | Admitting: Physician Assistant

## 2023-09-19 VITALS — BP 111/78 | HR 62 | Ht 63.0 in | Wt 189.0 lb

## 2023-09-19 DIAGNOSIS — F5104 Psychophysiologic insomnia: Secondary | ICD-10-CM

## 2023-09-19 DIAGNOSIS — F439 Reaction to severe stress, unspecified: Secondary | ICD-10-CM

## 2023-09-19 DIAGNOSIS — H938X3 Other specified disorders of ear, bilateral: Secondary | ICD-10-CM

## 2023-09-19 DIAGNOSIS — G43009 Migraine without aura, not intractable, without status migrainosus: Secondary | ICD-10-CM

## 2023-09-19 DIAGNOSIS — E559 Vitamin D deficiency, unspecified: Secondary | ICD-10-CM

## 2023-09-19 MED ORDER — HYDROXYZINE HCL 25 MG PO TABS
25.0000 mg | ORAL_TABLET | Freq: Every evening | ORAL | 0 refills | Status: DC | PRN
Start: 2023-09-19 — End: 2024-03-09
  Filled 2023-09-19: qty 30, 30d supply, fill #0

## 2023-09-19 MED ORDER — HYDROXYZINE HCL 10 MG PO TABS
10.0000 mg | ORAL_TABLET | Freq: Three times a day (TID) | ORAL | 0 refills | Status: DC | PRN
Start: 2023-09-19 — End: 2023-09-19
  Filled 2023-09-19: qty 60, 20d supply, fill #0

## 2023-09-19 MED ORDER — FLUTICASONE PROPIONATE 50 MCG/ACT NA SUSP
2.0000 | Freq: Every day | NASAL | 6 refills | Status: DC
Start: 2023-09-19 — End: 2024-06-19
  Filled 2023-09-19: qty 16, 30d supply, fill #0

## 2023-09-19 MED ORDER — CETIRIZINE HCL 10 MG PO TABS
10.0000 mg | ORAL_TABLET | Freq: Every day | ORAL | 11 refills | Status: DC
Start: 2023-09-19 — End: 2024-06-19
  Filled 2023-09-19: qty 30, 30d supply, fill #0

## 2023-09-19 MED ORDER — SUMATRIPTAN SUCCINATE 50 MG PO TABS
50.0000 mg | ORAL_TABLET | ORAL | 0 refills | Status: DC | PRN
Start: 2023-09-19 — End: 2023-12-20
  Filled 2023-09-19: qty 10, 5d supply, fill #0

## 2023-09-19 NOTE — Patient Instructions (Addendum)
To help with your ear fullness, you are going to take Zyrtec on a daily basis and uses Flonase on a daily basis as well.  To help with your migraines, continue proper water hydration, work on reducing your stress, and work on improving your sleep.  You can use Imitrex when the migraine starts.  To help with your sleep, I encourage you to do a trial of hydroxyzine 25 mg at bedtime.  I started a referral for you to be seen by the clinic counselor to help you manage your stress.  We will call you with today's lab results, and we will call you in 2 weeks for you to follow-up with the mobile unit.  I hope that you feel better soon, please let us know if there is anything else we can do for you in the meantime.  Roney Jaffe, PA-C Physician Assistant [facility] [email or web address]  Managing Stress, Adult Feeling a certain amount of stress is normal. Stress helps our body and mind get ready to deal with the demands of life. Stress hormones can motivate you to do well at work and meet your responsibilities. But severe or long-term (chronic) stress can affect your mental and physical health. Chronic stress puts you at higher risk for: Anxiety and depression. Other health problems such as digestive problems, muscle aches, heart disease, high blood pressure, and stroke. What are the causes? Common causes of stress include: Demands from work, such as deadlines, feeling overworked, or having long hours. Pressures at home, such as money issues, disagreements with a spouse, or parenting issues. Pressures from major life changes, such as divorce, moving, loss of a loved one, or chronic illness. You may be at higher risk for stress-related problems if you: Do not get enough sleep. Are in poor health. Do not have emotional support. Have a mental health disorder such as anxiety or depression. How to recognize stress Stress can make you: Have  trouble sleeping. Feel sad, anxious, irritable, or overwhelmed. Lose your appetite. Overeat or want to eat unhealthy foods. Want to use drugs or alcohol. Stress can also cause physical symptoms, such as: Sore, tense muscles, especially in the shoulders and neck. Headaches. Trouble breathing. A faster heart rate. Stomach pain, nausea, or vomiting. Diarrhea or constipation. Trouble concentrating. Follow these instructions at home: Eating and drinking Eat a healthy diet. This includes: Eating foods that are high in fiber, such as beans, whole grains, and fresh fruits and vegetables. Limiting foods that are high in fat and processed sugars, such as fried or sweet foods. Do not skip meals or overeat. Drink enough fluid to keep your urine pale yellow. Alcohol use Do not drink alcohol if: Your health care provider tells you not to drink. You are pregnant, may be pregnant, or are planning to become pregnant. Drinking alcohol is a way some people try to ease their stress. This can be dangerous, so if you drink alcohol: Limit how much you have to: 0-1 drink a day for women. 0-2 drinks a day for men. Know how much alcohol is in your drink. In the [place] one drink equals one 12 oz bottle of beer (355 mL), one 5 oz glass of wine (148 mL), or one 1 oz glass of hard liquor (44 mL). Activity  Include 30 minutes of exercise in your daily schedule. Exercise is a good stress reducer. Include time in your day for an activity that you find relaxing. Try taking a walk, going on a bike ride, reading  a book, or listening to music. Schedule your time in a way that lowers stress, and keep a regular schedule. Focus on doing what is most important to get done. Lifestyle Identify the source of your stress and your reaction to it. See a therapist who can help you change unhelpful reactions. When there are stressful events: Talk about them with family, friends, or coworkers. Try to think realistically about  stressful events and not ignore them or overreact. Try to find the positives in a stressful situation and not focus on the negatives. Cut back on responsibilities at work and home, if possible. Ask for help from friends or family members if you need it. Find ways to manage stress, such as: Mindfulness, meditation, or deep breathing. Yoga or tai chi. Progressive muscle relaxation. Spending time in nature. Doing art, playing music, or reading. Making time for fun activities. Spending time with family and friends. Get support from family, friends, or spiritual resources. General instructions Get enough sleep. Try to go to sleep and get up at about the same time every day. Take over-the-counter and prescription medicines only as told by your health care provider. Do not use any products that contain nicotine or tobacco. These products include cigarettes, chewing tobacco, and vaping devices, such as e-cigarettes. If you need help quitting, ask your health care provider. Do not use drugs or smoke to deal with stress. Keep all follow-up visits. This is important. Where to find support Talk with your health care provider about stress management or finding a support group. Find a therapist to work with you on your stress management techniques. Where to find more information [employer] on Mental Illness: www.nami.org American Psychological Association: [employer].ca Contact a health care provider if: Your stress symptoms get worse. You are unable to manage your stress at home. You are struggling to stop using drugs or alcohol. Get help right away if: You may be a danger to yourself or others. You have any thoughts of death or suicide. Get help right awayif you feel like you may hurt yourself or others, or have thoughts about taking your own life. Go to your nearest emergency room or: Call 911. Call the National Suicide Prevention Lifeline at 386-077-3630 or 988 in the [place] This is open  24 hours a day. Text the Crisis Text Line at 704-255-3247. Summary Feeling a certain amount of stress is normal, but severe or long-term (chronic) stress can affect your mental and physical health. Chronic stress can put you at higher risk for anxiety, depression, and other health problems such as digestive problems, muscle aches, heart disease, high blood pressure, and stroke. You may be at higher risk for stress-related problems if you do not get enough sleep, are in poor health, lack emotional support, or have a mental health disorder such as anxiety or depression. Identify the source of your stress and your reaction to it. Try talking about stressful events with family, friends, or coworkers, finding a coping method, or getting support from spiritual resources. If you need more help, talk with your health care provider about finding a support group or a mental health therapist. This information is not intended to replace advice given to you by your health care provider. Make sure you discuss any questions you have with your health care provider. Document Revised: 05/26/2021 Document Reviewed: 05/24/2021 Elsevier Patient Education  2024 [employer].

## 2023-09-19 NOTE — Telephone Encounter (Signed)
     Chief Complaint: Headaches that come and go. Nausea, light sensitivity. Symptoms: Above Frequency: 1 month ago Pertinent Negatives: Patient denies  Disposition: [] ED /[] Urgent Care (no appt availability in office) / [] Appointment(In office/virtual)/ []  [facility]/ [] Home Care/ [] Refused Recommended Disposition /[x] [facility]/ []  Follow-up with PCP Additional Notes: Agrees  with Mobile Unit.  Reason for Disposition  [1] MODERATE headache (e.g., interferes with normal activities) AND [2] present > 24 hours AND [3] unexplained  (Exceptions: analgesics not tried, typical migraine, or headache part of viral illness)  Answer Assessment - Initial Assessment Questions 1. LOCATION: "Where does it hurt?"      Sides over ears 2. ONSET: "When did the headache start?" (Minutes, hours or days)      1 month ago 3. PATTERN: "Does the pain come and go, or has it been constant since it started?"     Comes and goes 4. SEVERITY: "How bad is the pain?" and "What does it keep you from doing?"  (e.g., Scale 1-10; mild, moderate, or severe)   - MILD (1-3): doesn't interfere with normal activities    - MODERATE (4-7): interferes with normal activities or awakens from sleep    - SEVERE (8-10): excruciating pain, unable to do any normal activities        10 5. RECURRENT SYMPTOM: "Have you ever had headaches before?" If Yes, ask: "When was the last time?" and "What happened that time?"      Yes 6. CAUSE: "What do you think is causing the headache?"     Unsure 7. MIGRAINE: "Have you been diagnosed with migraine headaches?" If Yes, ask: "Is this headache similar?"      No 8. HEAD INJURY: "Has there been any recent injury to the head?"      No 9. OTHER SYMPTOMS: "Do you have any other symptoms?" (fever, stiff neck, eye pain, sore throat, cold symptoms)     Nausea, light sensitivity 10. PREGNANCY: "Is there any chance you are pregnant?" "When was your last menstrual period?"        No  Protocols used: Headache-A-AH

## 2023-09-19 NOTE — Progress Notes (Signed)
Established Patient Office Visit  Subjective   Patient ID: Debra Allen, female    DOB: [birth year]  Age: 37 y.o. MRN: [id number]  Chief Complaint  Patient presents with   Anxiety   Headache   Virtual Visit via Video Note  I connected with Debra Allen on 09/19/23 at  9:20 AM EST by a video enabled telemedicine application and verified that I am speaking with the correct person using two identifiers.  Location: Patient: Mobile screening [place] Provider: Home office   I discussed the limitations of evaluation and management by telemedicine and the availability of in person appointments. The patient expressed understanding and agreed to proceed.  History of Present Illness:    Observations/Objective: Medical history and current medications reviewed, no physical exam completed  States that she has been having a feeling of water in both the ears for the past 3 weeks.  States that she received her flu shot and feels it has gotten worse since then.  States that she has been having frontal headaches 3 times a week for the past 2 months.  States that she will experience photophobia during these and will use Tylenol with mild relief.  States that she started drinking 5 bottles of water a day in the past 3 weeks.  States that she has been having difficulty staying asleep, is only sleeping 5 hours a night.  States that she tries taking warm baths and drinking teas without relief.  Does endorse elevated stress levels due to caring for relatives.   Past Medical History:  Diagnosis Date   Anxiety    Headache(784.0)    Migraine   Urinary tract infection    Social History   Socioeconomic History   Marital status: Single    Spouse name: Not on file   Number of children: Not on file   Years of education: Not on file   Highest education level: Not on file  Occupational History   Not on file  Tobacco Use   Smoking status: Never    Passive exposure: Never    Smokeless tobacco: Never  Vaping Use   Vaping status: Never Used  Substance and Sexual Activity   Alcohol use: No   Drug use: No   Sexual activity: Yes  Other Topics Concern   Not on file  Social History Narrative   Not on file   Social Determinants of Health   Financial Resource Strain: Not on file  Food Insecurity: Not on file  Transportation Needs: Not on file  Physical Activity: Not on file  Stress: Not on file  Social Connections: Not on file  Intimate Partner Violence: Not on file   Family History  Problem Relation Age of Onset   Diabetes Mother    Hypertension Maternal Grandmother    Diabetes Maternal Grandmother    Cancer Paternal Grandmother    Cancer Paternal Grandfather    Hypertension Father    Cancer Paternal Aunt        bladder cancer    Asthma Other    Obesity Other    Sleep apnea Other    Heart disease Other    Other Neg Hx    Allergies  Allergen Reactions   Keflex [Cephalexin] Itching and Rash    Review of Systems  Constitutional:  Negative for chills and fever.  HENT:  Negative for ear discharge, ear pain, sinus pain and sore throat.   Eyes:  Positive for photophobia.  Respiratory:  Negative for shortness of breath.  Cardiovascular:  Negative for chest pain.  Gastrointestinal:  Negative for nausea and vomiting.  Genitourinary: Negative.   Musculoskeletal: Negative.   Skin: Negative.   Neurological:  Positive for headaches.  Endo/Heme/Allergies: Negative.   Psychiatric/Behavioral:  The patient has insomnia.       Objective:     BP 111/78 (BP Location: Left Arm, Patient Position: Sitting, Cuff Size: Large)   Pulse 62   Ht 5\' 3"  (1.6 m)   Wt 189 lb (85.7 kg)   SpO2 98%   BMI 33.48 kg/m  BP Readings from Last 3 Encounters:  09/19/23 111/78  06/28/23 108/73  04/11/23 119/87   Wt Readings from Last 3 Encounters:  09/19/23 189 lb (85.7 kg)  06/28/23 184 lb (83.5 kg)  04/11/23 191 lb 2.2 oz (86.7 kg)      Assessment & Plan:    Problem List Items Addressed This Visit   None Visit Diagnoses     Ear fullness, bilateral    -  Primary   Relevant Medications   cetirizine (ZYRTEC) 10 MG tablet   fluticasone (FLONASE) 50 MCG/ACT nasal spray   Migraine without aura and without status migrainosus, not intractable       Relevant Medications   SUMAtriptan (IMITREX) 50 MG tablet   Other Relevant Orders   CBC with Differential/Platelet   Basic metabolic panel   TSH   Stress       Relevant Orders   Ambulatory referral to Social Work   Psychophysiological insomnia       Relevant Medications   hydrOXYzine (ATARAX) 25 MG tablet   Vitamin D deficiency       Relevant Orders   Vitamin D, 25-hydroxy       Assessment and Plan: 1. Migraine without aura and without status migrainosus, not intractable Trial sumatriptan hand.  Patient education given on supportive care.  Red flags given for prompt reevaluation - SUMAtriptan (IMITREX) 50 MG tablet; Take 1 tablet (50 mg total) by mouth every 2 (two) hours as needed for migraine. May repeat in 2 hours if headache persists or recurs.  Dispense: 10 tablet; Refill: 0 - CBC with Differential/Platelet - Basic metabolic panel - TSH  2. Ear fullness, bilateral Trial Zyrtec, Flonase. - cetirizine (ZYRTEC) 10 MG tablet; Take 1 tablet (10 mg total) by mouth daily.  Dispense: 30 tablet; Refill: 11 - fluticasone (FLONASE) 50 MCG/ACT nasal spray; Place 2 sprays into both nostrils daily.  Dispense: 16 g; Refill: 6  3. Stress Patient agreeable to referral for CBT.  Patient education given on managing stress - Ambulatory referral to Social Work  4. Psychophysiological insomnia Trial hydroxyzine.  Patient education given on good sleep hygiene - hydrOXYzine (ATARAX) 25 MG tablet; Take 1 tablet (25 mg total) by mouth at bedtime as needed.  Dispense: 30 tablet; Refill: 0  5. Vitamin D deficiency  - Vitamin D, 25-hydroxy   Follow Up Instructions:    I discussed the assessment  and treatment plan with the patient. The patient was provided an opportunity to ask questions and all were answered. The patient agreed with the plan and demonstrated an understanding of the instructions.   The patient was advised to call back or seek an in-person evaluation if the symptoms worsen or if the condition fails to improve as anticipated.  I provided 20 minutes of non-face-to-face time during this encounter.    Return in about 2 weeks (around 10/03/2023) for With [facility].    Kasandra Knudsen Mayers, PA-C

## 2023-09-20 ENCOUNTER — Other Ambulatory Visit: Payer: Self-pay

## 2023-09-20 LAB — BASIC METABOLIC PANEL
BUN/Creatinine Ratio: 21 (ref 9–23)
BUN: 10 mg/dL (ref 6–20)
CO2: 19 mmol/L — ABNORMAL LOW (ref 20–29)
Calcium: 9.5 mg/dL (ref 8.7–10.2)
Chloride: 102 mmol/L (ref 96–106)
Creatinine, Ser: 0.47 mg/dL — ABNORMAL LOW (ref 0.57–1.00)
Glucose: 58 mg/dL — ABNORMAL LOW (ref 70–99)
Potassium: 3.5 mmol/L (ref 3.5–5.2)
Sodium: 141 mmol/L (ref 134–144)
eGFR: 127 mL/min/{1.73_m2} (ref >59)

## 2023-09-20 LAB — CBC WITH DIFFERENTIAL/PLATELET
Basophils Absolute: 0 10*3/uL (ref 0.0–0.2)
Basos: 1 %
EOS (ABSOLUTE): 0.1 10*3/uL (ref 0.0–0.4)
Eos: 2 %
Hematocrit: 40.9 % (ref 34.0–46.6)
Hemoglobin: 13.2 g/dL (ref 11.1–15.9)
Immature Grans (Abs): 0 10*3/uL (ref 0.0–0.1)
Immature Granulocytes: 0 %
Lymphocytes Absolute: 2.3 10*3/uL (ref 0.7–3.1)
Lymphs: 43 %
MCH: 29.8 pg (ref 26.6–33.0)
MCHC: 32.3 g/dL (ref 31.5–35.7)
MCV: 92 fL (ref 79–97)
Monocytes Absolute: 0.3 10*3/uL (ref 0.1–0.9)
Monocytes: 6 %
Neutrophils Absolute: 2.5 10*3/uL (ref 1.4–7.0)
Neutrophils: 48 %
Platelets: 251 10*3/uL (ref 150–450)
RBC: 4.43 x10E6/uL (ref 3.77–5.28)
RDW: 12.9 % (ref 11.7–15.4)
WBC: 5.2 10*3/uL (ref 3.4–10.8)

## 2023-09-20 LAB — VITAMIN D 25 HYDROXY (VIT D DEFICIENCY, FRACTURES): Vit D, 25-Hydroxy: 13.9 ng/mL — ABNORMAL LOW (ref 30.0–100.0)

## 2023-09-20 LAB — TSH: TSH: 2.54 u[IU]/mL (ref 0.450–4.500)

## 2023-09-20 MED ORDER — VITAMIN D (ERGOCALCIFEROL) 1.25 MG (50000 UNIT) PO CAPS
50000.0000 [IU] | ORAL_CAPSULE | ORAL | 2 refills | Status: DC
Start: 2023-09-20 — End: 2024-06-19
  Filled 2023-09-20: qty 4, 28d supply, fill #0
  Filled 2023-11-02: qty 4, 28d supply, fill #1

## 2023-09-20 NOTE — Addendum Note (Signed)
Addended by: Roney Jaffe on: 09/20/2023 10:18 AM   Modules accepted: Orders

## 2023-10-01 ENCOUNTER — Telehealth: Payer: Self-pay | Admitting: Nurse Practitioner

## 2023-10-01 NOTE — Telephone Encounter (Signed)
Contacted pt to inform him of locations of [facility] to complete follow up visit. Pt call was disconnected attempted to call back no answer, LVM.

## 2023-10-03 ENCOUNTER — Encounter: Payer: Self-pay | Admitting: Physician Assistant

## 2023-10-03 ENCOUNTER — Ambulatory Visit: Payer: Self-pay | Admitting: Physician Assistant

## 2023-10-03 VITALS — BP 102/67 | HR 75 | Ht 63.0 in | Wt 188.0 lb

## 2023-10-03 DIAGNOSIS — F5104 Psychophysiologic insomnia: Secondary | ICD-10-CM

## 2023-10-03 DIAGNOSIS — H9311 Tinnitus, right ear: Secondary | ICD-10-CM

## 2023-10-03 DIAGNOSIS — G43009 Migraine without aura, not intractable, without status migrainosus: Secondary | ICD-10-CM

## 2023-10-03 DIAGNOSIS — Z309 Encounter for contraceptive management, unspecified: Secondary | ICD-10-CM

## 2023-10-03 DIAGNOSIS — F439 Reaction to severe stress, unspecified: Secondary | ICD-10-CM

## 2023-10-03 DIAGNOSIS — E559 Vitamin D deficiency, unspecified: Secondary | ICD-10-CM

## 2023-10-03 NOTE — Patient Instructions (Signed)
I do encourage you to continue taking the Zyrtec on a daily basis for at least the next month.  Keep up the great work with your lifestyle changes.  I started a referral for you to be seen by gynecology, they will reach out to you to schedule an appointment.  I will follow-up on the referral that was started to the clinic counselor.  Please let us know if there is any else we can do for you  Roney Jaffe, PA-C Physician Assistant [facility] [email or web address]   Tinnitus Tinnitus is when you hear a sound that there's no actual source for. It may sound like ringing in your ears or something else. The sound may be loud, soft, or somewhere in between. It can last for a few seconds or be constant for days. It can come and go. Almost everyone has tinnitus at some point. It's not the same as hearing loss. But you may need to see a health care provider if: It lasts for a long time. It comes back often. You have trouble sleeping and focusing. What are the causes? The cause of tinnitus is often unknown. In some cases, you may get it if: You're around loud noises, such as from machines or music. An object gets stuck in your ear. Earwax builds up in Landscape architect. You drink a lot of alcohol or caffeine. You take certain medicines. You start to lose your hearing. You may also get it from some medical conditions. These may include: Ear or sinus infections. Heart diseases. High blood pressure. Allergies. Mnire's disease. Problems with your thyroid. A tumor. This is a growth of cells that isn't normal. A weak, bulging blood vessel called an aneurysm near your ear. What increases the risk? You may be more likely to get tinnitus if: You're around loud noises a lot. You're older. You drink alcohol. You smoke. What are the signs or symptoms? The main symptom is hearing a sound that there's no source for. It may sound like ringing. It  may also sound like: Buzzing. Sizzling. Blowing air. Hissing. Whistling. Other sounds may include: Roaring. Running water. A musical note. Tapping. Humming. You may have symptoms in one ear or both ears. How is this diagnosed? Tinnitus is diagnosed based on your symptoms, your medical history, and an exam. Your provider may do a full hearing test if your tinnitus: Is in just one ear. Makes it hard for you to hear. Lasts 6 months or longer. You may also need to see an expert in hearing disorders called an audiologist. They may ask you about your symptoms and how tinnitus affects your daily life. You may have other tests done. These may include: A CT scan. An MRI. An angiogram. This shows how blood flows through your blood vessels. How is this treated? Treatment may include: Therapy to help you manage the stress of living with tinnitus. Finding ways to mask or cover the sound of tinnitus. These include: Sound or white noise machines. Devices that fit in your ear and play sounds or music. A loud humidifier. Acoustic neural stimulation. This is when you use headphones to listen to music that has a special signal in it. Over time, this signal may change some of the pathways in your brain. This can make you less sensitive to tinnitus. This treatment is used for very severe cases. Using hearing aids or cochlear implants if your tinnitus is from hearing loss. If your tinnitus is caused by a medical condition, treating the  condition may make it go away.  Follow these instructions at home: Managing symptoms     Try to avoid being in loud places or around loud noises. Wear earplugs or headphones when you're around loud noises. Find ways to reduce stress. These may include meditation, yoga, or deep breathing. Sleep with your head slightly raised. General instructions Take over-the-counter and prescription medicines only as told by your provider. Track the things that cause symptoms  (triggers). Try to avoid these things. To stop your tinnitus from getting worse: Do not drink alcohol. Do not have caffeine. Do not use any products that contain nicotine or tobacco. These products include cigarettes, chewing tobacco, and vaping devices, such as e-cigarettes. If you need help quitting, ask your provider. Avoid using too much salt. Get enough sleep each night. Where to find more information American Tinnitus Association: https://www.johnson-hamilton.org/ Contact a health care provider if: Your symptoms last for 3 weeks or longer without stopping. You have sudden hearing loss. Your symptoms get worse or don't get better with home care. You can't manage the stress of living with tinnitus. Get help right away if: You get tinnitus after a head injury. You have tinnitus and: Dizziness. Nausea and vomiting. Loss of balance. A sudden, severe headache. Changes to your eyesight. Weakness in your face, arms, or legs. These symptoms may be an emergency. Get help right away. Call 911. Do not wait to see if the symptoms will go away. Do not drive yourself to the hospital. This information is not intended to replace advice given to you by your health care provider. Make sure you discuss any questions you have with your health care provider. Document Revised: 02/05/2023 Document Reviewed: 02/05/2023 Elsevier Patient Education  2024 [employer].

## 2023-10-03 NOTE — Progress Notes (Signed)
Established Patient Office Visit  Subjective   Patient ID: Debra Allen, female    DOB: December 10, 1985  Age: 37 y.o. MRN: [id number]  Chief Complaint  Patient presents with   Follow-up    States that she has had improvement in her headaches.  States that she did have to use the sumatriptan hand 1 time.  States that she has improved her sleep, is sleeping 7 to 8 hours a night, states that she is using the hydroxyzine with relief.  States that she continues to have a buzzing sound in her right ear, states it is present at night and in the morning when she first wakes up.  States that she did take a Zyrtec 1 time but did not notice any difference.  States that she does sleep on her left side.  Denies pain.  States that she has been working on improving her lifestyle, states that she stopped eating after 6:00, has continued good hydration.  States that she feels less bloated and feels this is offering some relief for her headaches and sleep issues.  States that she has not heard from the clinic counselor for CBT.  Does endorse that she feels her stress levels are improving with her lifestyle modifications.  States that she did start the vitamin D weekly supplementation     Past Medical History:  Diagnosis Date   Anxiety    Headache(784.0)    Migraine   Urinary tract infection    Social History   Socioeconomic History   Marital status: Single    Spouse name: Not on file   Number of children: Not on file   Years of education: Not on file   Highest education level: Not on file  Occupational History   Not on file  Tobacco Use   Smoking status: Never    Passive exposure: Never   Smokeless tobacco: Never  Vaping Use   Vaping status: Never Used  Substance and Sexual Activity   Alcohol use: No   Drug use: No   Sexual activity: Yes  Other Topics Concern   Not on file  Social History Narrative   Not on file   Social Determinants of Health   Financial Resource Strain:  Not on file  Food Insecurity: Not on file  Transportation Needs: Not on file  Physical Activity: Not on file  Stress: Not on file  Social Connections: Not on file  Intimate Partner Violence: Not on file   Family History  Problem Relation Age of Onset   Diabetes Mother    Hypertension Maternal Grandmother    Diabetes Maternal Grandmother    Cancer Paternal Grandmother    Cancer Paternal Grandfather    Hypertension Father    Cancer Paternal Aunt        bladder cancer    Asthma Other    Obesity Other    Sleep apnea Other    Heart disease Other    Other Neg Hx    Allergies  Allergen Reactions   Keflex [Cephalexin] Itching and Rash    Review of Systems  Constitutional:  Negative for chills and fever.  HENT:  Positive for tinnitus. Negative for congestion, ear discharge, ear pain and hearing loss.   Eyes:  Negative for photophobia.  Respiratory:  Negative for shortness of breath.   Cardiovascular:  Negative for chest pain.  Gastrointestinal: Negative.   Genitourinary: Negative.   Musculoskeletal: Negative.   Skin: Negative.   Neurological:  Negative for headaches.  Endo/Heme/Allergies: Negative.  Psychiatric/Behavioral:  The patient does not have insomnia.       Objective:     BP 102/67 (BP Location: Left Arm, Patient Position: Sitting, Cuff Size: Large)   Pulse 75   Ht 5\' 3"  (1.6 m)   Wt 188 lb (85.3 kg)   SpO2 93%   BMI 33.30 kg/m  BP Readings from Last 3 Encounters:  10/03/23 102/67  09/19/23 111/78  06/28/23 108/73   Wt Readings from Last 3 Encounters:  10/03/23 188 lb (85.3 kg)  09/19/23 189 lb (85.7 kg)  06/28/23 184 lb (83.5 kg)    Physical Exam Vitals and nursing note reviewed.  Constitutional:      Appearance: Normal appearance.  HENT:     Head: Normocephalic and atraumatic.     Right Ear: Tympanic membrane, ear canal and external ear normal.     Left Ear: Tympanic membrane, ear canal and external ear normal.     Nose: Nose normal.      Mouth/Throat:     Mouth: Mucous membranes are moist.     Pharynx: Oropharynx is clear.  Eyes:     Extraocular Movements: Extraocular movements intact.     Conjunctiva/sclera: Conjunctivae normal.     Pupils: Pupils are equal, round, and reactive to light.  Cardiovascular:     Rate and Rhythm: Normal rate and regular rhythm.     Pulses: Normal pulses.     Heart sounds: Normal heart sounds.  Pulmonary:     Effort: Pulmonary effort is normal.     Breath sounds: Normal breath sounds.  Musculoskeletal:        General: Normal range of motion.     Cervical back: Normal range of motion.  Skin:    General: Skin is warm and dry.  Neurological:     General: No focal deficit present.     Mental Status: She is alert and oriented to person, place, and time.  Psychiatric:        Mood and Affect: Mood normal.        Behavior: Behavior normal.        Thought Content: Thought content normal.        Judgment: Judgment normal.       Assessment & Plan:   Problem List Items Addressed This Visit   None Visit Diagnoses     Encounter for contraceptive management, unspecified type    -  Primary   Relevant Orders   Ambulatory referral to Gynecology   Migraine without aura and without status migrainosus, not intractable       Psychophysiological insomnia       Stress       Tinnitus of right ear       Vitamin D deficiency          1. Migraine without aura and without status migrainosus, not intractable Continue current regimen, no refills needed today  2. Psychophysiological insomnia Continue current regimen, no refills needed today  3. Stress Reached out to referral coordinator, patient does still desire CBT  4. Tinnitus of right ear Patient education given on supportive care  5. Vitamin D deficiency Continue current regimen, no refill  needed today  6. Encounter for contraceptive management, unspecified type Patient would like referral to gynecology to discuss possible IUD  placement - Ambulatory referral to Gynecology   I have reviewed the patient's medical history (PMH, PSH, Social History, Family History, Medications, and allergies) , and have been updated if relevant. I spent 30 minutes reviewing  chart and  face to face time with patient.    Return if symptoms worsen or fail to improve.    Kasandra Knudsen Mayers, PA-C

## 2023-10-21 ENCOUNTER — Encounter (HOSPITAL_COMMUNITY): Payer: Self-pay

## 2023-10-21 ENCOUNTER — Inpatient Hospital Stay (HOSPITAL_COMMUNITY)
Admission: AD | Admit: 2023-10-21 | Discharge: 2023-10-21 | Disposition: A | Discharge status: Home / Self Care | Payer: Self-pay | Attending: Obstetrics and Gynecology | Admitting: Obstetrics and Gynecology

## 2023-10-21 DIAGNOSIS — G43E09 Chronic migraine with aura, not intractable, without status migrainosus: Secondary | ICD-10-CM | POA: Insufficient documentation

## 2023-10-21 DIAGNOSIS — N939 Abnormal uterine and vaginal bleeding, unspecified: Secondary | ICD-10-CM | POA: Insufficient documentation

## 2023-10-21 DIAGNOSIS — Z3202 Encounter for pregnancy test, result negative: Secondary | ICD-10-CM | POA: Insufficient documentation

## 2023-10-21 DIAGNOSIS — N946 Dysmenorrhea, unspecified: Secondary | ICD-10-CM

## 2023-10-21 LAB — HCG, QUANTITATIVE, PREGNANCY: hCG, Beta Chain, Quant, S: <1 m[IU]/mL (ref <5)

## 2023-10-21 LAB — POCT PREGNANCY, URINE: Preg Test, Ur: NEGATIVE

## 2023-10-21 MED ORDER — CYCLOBENZAPRINE HCL 5 MG PO TABS
5.0000 mg | ORAL_TABLET | Freq: Three times a day (TID) | ORAL | Status: DC | PRN
  Administered 2023-10-21: 5 mg via ORAL
  Filled 2023-10-21: qty 1

## 2023-10-21 MED ORDER — ACETAMINOPHEN-CAFFEINE 500-65 MG PO TABS
2.0000 | ORAL_TABLET | Freq: Once | ORAL | Status: AC
  Administered 2023-10-21: 2 via ORAL
  Filled 2023-10-21: qty 2

## 2023-10-21 NOTE — MAU Provider Note (Addendum)
Chief Complaint: Headache and Vaginal Bleeding  SUBJECTIVE HPI: Debra Allen is a 37 y.o. [id number] at Unknown by LMP who presents to maternity admissions reporting for headaches and 2x positive pregnancy test. She is trying to conceive, but suffers from migraines and wants to take her Sumatriptan but was concerned she may be pregnant and wanted to makes sure it was safe. Headache is similar to previous. She took tylenol at 0600 with some relief, but it is getting worse.  She denies vaginal bleeding, vaginal itching/burning, urinary symptoms, h/a, dizziness, n/v, or fever/chills.    HPI  Past Medical History:  Diagnosis Date   Anxiety    Headache(784.0)    Migraine   Urinary tract infection    Past Surgical History:  Procedure Laterality Date   CHOLECYSTECTOMY N/A 09/24/2020   Procedure: LAPAROSCOPIC CHOLECYSTECTOMY;  Surgeon: Griselda Miner, MD;  Location: [facility] OR;  Service: General;  Laterality: N/A;   NO PAST SURGERIES     Social History   Socioeconomic History   Marital status: Single    Spouse name: Not on file   Number of children: Not on file   Years of education: Not on file   Highest education level: Not on file  Occupational History   Not on file  Tobacco Use   Smoking status: Never    Passive exposure: Never   Smokeless tobacco: Never  Vaping Use   Vaping status: Never Used  Substance and Sexual Activity   Alcohol use: No   Drug use: No   Sexual activity: Yes  Other Topics Concern   Not on file  Social History Narrative   Not on file   Social Determinants of Health   Financial Resource Strain: Not on file  Food Insecurity: Not on file  Transportation Needs: Not on file  Physical Activity: Not on file  Stress: Not on file  Social Connections: Not on file  Intimate Partner Violence: Not on file   No current facility-administered medications on file prior to encounter.   Current Outpatient Medications on File Prior to Encounter  Medication Sig  Dispense Refill   acetaminophen (TYLENOL) 500 MG tablet Take 500 mg by mouth every 6 (six) hours as needed for mild pain or moderate pain.  (Patient not taking: Reported on 10/03/2023)     cetirizine (ZYRTEC) 10 MG tablet Take 1 tablet (10 mg total) by mouth daily. 30 tablet 11   famotidine (PEPCID) 40 MG tablet Take 1 tablet (40 mg total) by mouth once daily. 30 tablet 1   fluticasone (FLONASE) 50 MCG/ACT nasal spray Place 2 sprays into both nostrils daily. 16 g 6   hydrOXYzine (ATARAX) 25 MG tablet Take 1 tablet (25 mg total) by mouth at bedtime as needed. 30 tablet 0   ondansetron (ZOFRAN-ODT) 8 MG disintegrating tablet Dissolve 1 tablet (8 mg total) by mouth every 8 (eight) hours as needed for nausea or vomiting. (Patient not taking: Reported on 10/03/2023) 20 tablet 0   SUMAtriptan (IMITREX) 50 MG tablet Take 1 tablet (50 mg total) by mouth every 2 (two) hours as needed for migraine. May repeat in 2 hours if headache persists or recurs. 10 tablet 0   Vitamin D, Ergocalciferol, (DRISDOL) 1.25 MG (50000 UNIT) CAPS capsule Take 1 capsule (50,000 Units total) by mouth every 7 (seven) days. 4 capsule 2   Allergies  Allergen Reactions   Keflex [Cephalexin] Itching and Rash    I have reviewed patient's Past Medical Hx, Surgical Hx, Family Hx, Social Hx,  medications and allergies.   ROS:  Review of Systems Review of Systems  Other systems negative   Physical Exam  Physical Exam Patient Vitals for the past 24 hrs:  BP Temp Temp src Pulse Resp SpO2  10/21/23 1227 119/70 98.1 F (36.7 C) Oral 74 17 --  10/21/23 1226 -- -- -- -- -- 98 %   Constitutional: Well-developed, well-nourished female in no acute distress.  Cardiovascular: normal rate Respiratory: normal effort GI: Abd soft, non-tender. Pos BS x 4 MS: Extremities nontender, no edema, normal ROM Neurologic: Alert and oriented x 4.  GU: Neg CVAT.  LAB RESULTS Results for orders placed or performed during the hospital encounter  of 10/21/23 (from the past 24 hour(s))  Pregnancy, urine POC     Status: None   Collection Time: 10/21/23 12:15 PM  Result Value Ref Range   Preg Test, Ur NEGATIVE NEGATIVE  hCG, quantitative, pregnancy     Status: None   Collection Time: 10/21/23  1:01 PM  Result Value Ref Range   hCG, Beta Chain, Quant, S <1 <5 mIU/mL       IMAGING No results found.  MAU Management/MDM: I have reviewed the triage vital signs and the nursing notes.   Pertinent labs & imaging results that were available during my care of the patient were reviewed by me and considered in my medical decision making (see chart for details).      I have reviewed her medical records including past results, notes and treatments. Medical, Surgical, and family history were reviewed.  Medications and recent lab tests were reviewed  Treatments in MAU included urine pregnancy test, bHCG quant, Excedrin Tension, Flexeril.   This bleeding/pain can represent a normal pregnancy with bleeding, spontaneous abortion or even an ectopic which can be life-threatening.  The process as listed above helps to determine which of these is present.  ASSESSMENT 1. Vaginal spotting   2. 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 Unsure why home tests results positive, possible inaccurate use.   Migraine - Trial Excedrin and flexeril - improved. - Recommended taking Sumatriptan as prescribed  PLAN Discharge home  Pt stable at time of discharge. Encouraged to return here if she develops worsening of symptoms, increase in pain, fever, or other concerning symptoms.   Wyn Forster, MD FMOB Fellow, Faculty practice [facility], Center for [facility]  10/21/2023  2:09 PM

## 2023-10-21 NOTE — MAU Note (Signed)
.  Debra Allen is a 37 y.o. at Unknown here in MAU reporting: pos hpt X2 last week, had one episode of spotting yesterday with cramping.  Denies pain or bleeding today.  HX of irreg periods. Reports headache, states she just needs to know if she's preg before she takes any medication.  UPT in mau is neg.  LMP: 10/31 Onset of complaint: yesterday Pain score: 0 Vitals:   10/21/23 1226 10/21/23 1227  BP:  119/70  Pulse:  74  Resp:  17  Temp:  98.1 F (36.7 C)  SpO2: 98%       Lab orders placed from triage:  UPT

## 2023-11-02 ENCOUNTER — Other Ambulatory Visit: Payer: Self-pay

## 2023-11-13 ENCOUNTER — Other Ambulatory Visit: Payer: Self-pay

## 2023-11-14 NOTE — L&D Delivery Note (Addendum)
 OB/GYN Faculty Practice Delivery Note  Debra Allen is a 37 y.o. [id number] s/p SVD at [redacted]w[redacted]d. She was admitted for IOL for non-reassuring BPP.   ROM: 4h 70m with clear fluid GBS Status: negative Maximum Maternal Temperature: 98.2  Labor Progress: Patient was admitted and SVE was 3cm. She was started on pitocin  followed by AROM. She continued to progress well to complete.  Delivery Date/Time: 9465, 07/16/24 Delivery: Called to room and patient was complete and pushing. Head delivered straight occiput anterior. nuchal cord absent. Shoulder and body delivered in usual fashion. Infant with spontaneous cry, placed on mother's abdomen, dried and stimulated. Cord clamped x 2 after 1-minute delay, and cut by father. Cord blood drawn. Placenta delivered spontaneously with gentle cord traction. Fundus firm with massage and Pitocin . Labia, perineum, vagina, and cervix inspected inspected with 1st degree perineal laceration which was hemostatic and not repaired.   Placenta:  spontaneous Intact Complications:none Lacerations: 1st degree EBL: Analgesia: Labor support without medications and IV pain meds  Newborn Data: Birth date:07/16/2022 Birth time:5:34 AM Gender:Female Living status:Living Apgars:8 ,9  Weight:  Pending         Leeroy Pouch, MD [facility] Fellow, [facility] for [facility], [facility] 07/16/2024, 5:57 AM

## 2023-11-20 ENCOUNTER — Encounter: Payer: Self-pay | Admitting: Family Medicine

## 2023-12-19 ENCOUNTER — Encounter (HOSPITAL_COMMUNITY): Payer: Self-pay

## 2023-12-19 ENCOUNTER — Inpatient Hospital Stay (HOSPITAL_COMMUNITY): Payer: Self-pay

## 2023-12-19 ENCOUNTER — Inpatient Hospital Stay (HOSPITAL_COMMUNITY)
Admission: AD | Admit: 2023-12-19 | Discharge: 2023-12-20 | Disposition: A | Discharge status: Home / Self Care | Payer: Self-pay | Attending: Obstetrics & Gynecology | Admitting: Obstetrics & Gynecology

## 2023-12-19 DIAGNOSIS — O219 Vomiting of pregnancy, unspecified: Secondary | ICD-10-CM | POA: Insufficient documentation

## 2023-12-19 DIAGNOSIS — O3680X0 Pregnancy with inconclusive fetal viability, not applicable or unspecified: Secondary | ICD-10-CM | POA: Insufficient documentation

## 2023-12-19 DIAGNOSIS — R102 Pelvic and perineal pain: Secondary | ICD-10-CM | POA: Insufficient documentation

## 2023-12-19 DIAGNOSIS — O26891 Other specified pregnancy related conditions, first trimester: Secondary | ICD-10-CM | POA: Insufficient documentation

## 2023-12-19 DIAGNOSIS — O26899 Other specified pregnancy related conditions, unspecified trimester: Secondary | ICD-10-CM

## 2023-12-19 DIAGNOSIS — O09521 Supervision of elderly multigravida, first trimester: Secondary | ICD-10-CM | POA: Insufficient documentation

## 2023-12-19 DIAGNOSIS — Z3A08 8 weeks gestation of pregnancy: Secondary | ICD-10-CM | POA: Insufficient documentation

## 2023-12-19 LAB — CBC
HCT: 33.2 % — ABNORMAL LOW (ref 36.0–46.0)
Hemoglobin: 11.4 g/dL — ABNORMAL LOW (ref 12.0–15.0)
MCH: 30.2 pg (ref 26.0–34.0)
MCHC: 34.3 g/dL (ref 30.0–36.0)
MCV: 87.8 fL (ref 80.0–100.0)
Platelets: 211 10*3/uL (ref 150–400)
RBC: 3.78 MIL/uL — ABNORMAL LOW (ref 3.87–5.11)
RDW: 13.3 % (ref 11.5–15.5)
WBC: 8.7 10*3/uL (ref 4.0–10.5)
nRBC: 0 % (ref 0.0–0.2)

## 2023-12-19 LAB — COMPREHENSIVE METABOLIC PANEL
ALT: 44 U/L (ref 0–44)
AST: 25 U/L (ref 15–41)
Albumin: 3.7 g/dL (ref 3.5–5.0)
Alkaline Phosphatase: 61 U/L (ref 38–126)
Anion gap: 9 (ref 5–15)
BUN: 8 mg/dL (ref 6–20)
CO2: 21 mmol/L — ABNORMAL LOW (ref 22–32)
Calcium: 9.6 mg/dL (ref 8.9–10.3)
Chloride: 106 mmol/L (ref 98–111)
Creatinine, Ser: 0.44 mg/dL (ref 0.44–1.00)
GFR, Estimated: >60 mL/min (ref >60)
Glucose, Bld: 122 mg/dL — ABNORMAL HIGH (ref 70–99)
Potassium: 3.8 mmol/L (ref 3.5–5.1)
Sodium: 136 mmol/L (ref 135–145)
Total Bilirubin: 1.1 mg/dL (ref 0.0–1.2)
Total Protein: 6.7 g/dL (ref 6.5–8.1)

## 2023-12-19 LAB — POCT PREGNANCY, URINE: Preg Test, Ur: POSITIVE — AB

## 2023-12-19 MED ORDER — ONDANSETRON 4 MG PO TBDP: 4.0000 mg | ORAL_TABLET | Freq: Four times a day (QID) | ORAL | 0 refills | Status: DC | PRN

## 2023-12-19 MED ORDER — DOXYLAMINE-PYRIDOXINE 10-10 MG PO TBEC: DELAYED_RELEASE_TABLET | ORAL | 0 refills | Status: DC

## 2023-12-19 MED ORDER — ONDANSETRON 4 MG PO TBDP
4.0000 mg | ORAL_TABLET | Freq: Four times a day (QID) | ORAL | 0 refills | Status: DC | PRN
  Filled 2023-12-19: qty 20, 5d supply, fill #0

## 2023-12-19 MED ORDER — DOXYLAMINE-PYRIDOXINE 10-10 MG PO TBEC
DELAYED_RELEASE_TABLET | ORAL | 0 refills | Status: DC
  Filled 2023-12-19: qty 120, fill #0

## 2023-12-19 NOTE — MAU Provider Note (Signed)
 Chief Complaint: Abdominal Pain   Event Date/Time   First Provider Initiated Contact with Patient 12/19/23 2342        SUBJECTIVE HPI: Debra Allen is a 38 y.o. H4E5995 at [redacted]w[redacted]d by LMP who presents to maternity admissions reporting lower abdominal pain and nausea. Has appt but has not been seen yet for this pregnancy. She denies vaginal bleeding, vaginal itching/burning, urinary symptoms, h/a, dizziness, n/v, or fever/chills.     Abdominal Pain This is a new problem. The current episode started in the past 7 days. The onset quality is gradual. The quality of the pain is cramping. Pertinent negatives include no constipation, diarrhea or dysuria. Nothing aggravates the pain. The pain is relieved by Nothing. She has tried nothing for the symptoms.   RN note: Debra Allen is a 38 y.o. at 8.3 weeks here in MAU reporting: for last 2 days feeling lower abdominal pain around to back and R side. New nausea and vomiting. Vaginal discharge-   LMP: 10/20/2024 Onset of complaint: 12/17/2023 Pain score: 7  Past Medical History:  Diagnosis Date   Anxiety    Headache(784.0)    Migraine   Urinary tract infection    Past Surgical History:  Procedure Laterality Date   CHOLECYSTECTOMY N/A 09/24/2020   Procedure: LAPAROSCOPIC CHOLECYSTECTOMY;  Surgeon: Curvin Deward MOULD, MD;  Location: [facility] OR;  Service: General;  Laterality: N/A;   NO PAST SURGERIES     Social History   Socioeconomic History   Marital status: Single    Spouse name: Not on file   Number of children: Not on file   Years of education: Not on file   Highest education level: Not on file  Occupational History   Not on file  Tobacco Use   Smoking status: Never    Passive exposure: Never   Smokeless tobacco: Never  Vaping Use   Vaping status: Never Used  Substance and Sexual Activity   Alcohol use: No   Drug use: No   Sexual activity: Yes  Other Topics Concern   Not on file  Social History Narrative   Not  on file   Social Drivers of Health   Financial Resource Strain: Not on file  Food Insecurity: Not on file  Transportation Needs: Not on file  Physical Activity: Not on file  Stress: Not on file  Social Connections: Not on file  Intimate Partner Violence: Not on file   No current facility-administered medications on file prior to encounter.   Current Outpatient Medications on File Prior to Encounter  Medication Sig Dispense Refill   acetaminophen  (TYLENOL ) 500 MG tablet Take 500 mg by mouth every 6 (six) hours as needed for mild pain or moderate pain.  (Patient not taking: Reported on 10/03/2023)     cetirizine  (ZYRTEC ) 10 MG tablet Take 1 tablet (10 mg total) by mouth daily. 30 tablet 11   famotidine  (PEPCID ) 40 MG tablet Take 1 tablet (40 mg total) by mouth once daily. 30 tablet 1   fluticasone  (FLONASE ) 50 MCG/ACT nasal spray Place 2 sprays into both nostrils daily. 16 g 6   hydrOXYzine  (ATARAX ) 25 MG tablet Take 1 tablet (25 mg total) by mouth at bedtime as needed. 30 tablet 0   ondansetron  (ZOFRAN -ODT) 8 MG disintegrating tablet Dissolve 1 tablet (8 mg total) by mouth every 8 (eight) hours as needed for nausea or vomiting. (Patient not taking: Reported on 10/03/2023) 20 tablet 0   SUMAtriptan  (IMITREX ) 50 MG tablet Take 1 tablet (50 mg total)  by mouth every 2 (two) hours as needed for migraine. May repeat in 2 hours if headache persists or recurs. 10 tablet 0   Vitamin D , Ergocalciferol , (DRISDOL ) 1.25 MG (50000 UNIT) CAPS capsule Take 1 capsule (50,000 Units total) by mouth every 7 (seven) days. 4 capsule 2   Allergies  Allergen Reactions   Keflex  [Cephalexin ] Itching and Rash    I have reviewed patient's Past Medical Hx, Surgical Hx, Family Hx, Social Hx, medications and allergies.   ROS:  Review of Systems  Gastrointestinal:  Positive for abdominal pain. Negative for constipation and diarrhea.  Genitourinary:  Negative for dysuria.   Review of Systems  Other systems  negative   Physical Exam  Physical Exam Patient Vitals for the past 24 hrs:  BP Temp Temp src Pulse Resp SpO2 Height Weight  12/19/23 2253 111/60 98 F (36.7 C) Oral 82 17 100 % 5' 2 (1.575 m) 88.3 kg   Constitutional: Well-developed, well-nourished female in no acute distress.  Cardiovascular: normal rate Respiratory: normal effort GI: Abd soft, non-tender.  MS: Extremities nontender, no edema, normal ROM Neurologic: Alert and oriented x 4.  GU: Neg CVAT.  PELVIC EXAM: deferred in lieu of ultrasound   LAB RESULTS Results for orders placed or performed during the hospital encounter of 12/19/23 (from the past 24 hours)  Pregnancy, urine POC     Status: Abnormal   Collection Time: 12/19/23  9:53 PM  Result Value Ref Range   Preg Test, Ur POSITIVE (A) NEGATIVE  CBC     Status: Abnormal   Collection Time: 12/19/23 10:31 PM  Result Value Ref Range   WBC 8.7 4.0 - 10.5 K/uL   RBC 3.78 (L) 3.87 - 5.11 MIL/uL   Hemoglobin 11.4 (L) 12.0 - 15.0 g/dL   HCT 66.7 (L) 63.9 - 53.9 %   MCV 87.8 80.0 - 100.0 fL   MCH 30.2 26.0 - 34.0 pg   MCHC 34.3 30.0 - 36.0 g/dL   RDW 86.6 88.4 - 84.4 %   Platelets 211 150 - 400 K/uL   nRBC 0.0 0.0 - 0.2 %  Comprehensive metabolic panel     Status: Abnormal   Collection Time: 12/19/23 10:31 PM  Result Value Ref Range   Sodium 136 135 - 145 mmol/L   Potassium 3.8 3.5 - 5.1 mmol/L   Chloride 106 98 - 111 mmol/L   CO2 21 (L) 22 - 32 mmol/L   Glucose, Bld 122 (H) 70 - 99 mg/dL   BUN 8 6 - 20 mg/dL   Creatinine, Ser 9.55 0.44 - 1.00 mg/dL   Calcium 9.6 8.9 - 89.6 mg/dL   Total Protein 6.7 6.5 - 8.1 g/dL   Albumin 3.7 3.5 - 5.0 g/dL   AST 25 15 - 41 U/L   ALT 44 0 - 44 U/L   Alkaline Phosphatase 61 38 - 126 U/L   Total Bilirubin 1.1 0.0 - 1.2 mg/dL   GFR, Estimated >39 >39 mL/min   Anion gap 9 5 - 15  hCG, quantitative, pregnancy     Status: Abnormal   Collection Time: 12/19/23 10:31 PM  Result Value Ref Range   hCG, Beta Chain, Quant, S  76,703 (H) <5 mIU/mL  HIV Antibody (routine testing w rflx)     Status: None   Collection Time: 12/19/23 10:31 PM  Result Value Ref Range   HIV Screen 4th Generation wRfx Non Reactive Non Reactive  Wet prep, genital     Status: None  Collection Time: 12/19/23 11:58 PM   Specimen: Vaginal  Result Value Ref Range   Yeast Wet Prep HPF POC NONE SEEN NONE SEEN   Trich, Wet Prep NONE SEEN NONE SEEN   Clue Cells Wet Prep HPF POC NONE SEEN NONE SEEN   WBC, Wet Prep HPF POC <10 <10   Sperm NONE SEEN      IMAGING US  OB Comp Less 14 Wks Result Date: 12/19/2023 CLINICAL DATA:  Pregnant, unknown LMP.  Pelvic cramping EXAM: OBSTETRIC <14 WK ULTRASOUND TECHNIQUE: Transabdominal ultrasound was performed for evaluation of the gestation as well as the maternal uterus and adnexal regions. COMPARISON:  None Available. FINDINGS: Intrauterine gestational sac: Single Yolk sac:  Visualized. Embryo:  Visualized. Cardiac Activity: Visualized. Heart Rate: 176 bpm CRL:   20 mm   8 w 4 d                  US  EDC: 07/26/2024 Subchorionic hemorrhage:  None visualized. Maternal uterus/adnexae: The cervix is not well visualized on this examination. No intrauterine masses are seen. No free fluid within the cul-de-sac. The maternal ovaries are unremarkable. IMPRESSION: Single living intrauterine gestation with an estimated gestational age of [redacted] weeks, 4 days. Electronically Signed   By: Dorethia Molt M.D.   On: 12/19/2023 23:16    MAU Management/MDM: I have reviewed the triage vital signs and the nursing notes.   Pertinent labs & imaging results that were available during my care of the patient were reviewed by me and considered in my medical decision making (see chart for details).      I have reviewed her medical records including past results, notes and treatments. Medical, Surgical, and family history were reviewed.  Medications and recent lab tests were reviewed  Ordered usual first trimester r/o ectopic labs.   Pelvic  cultures done Will check baseline Ultrasound to rule out ectopic.  This bleeding/pain can represent a normal pregnancy with bleeding, spontaneous abortion or even an ectopic which can be life-threatening.  The process as listed above helps to determine which of these is present.  Reviewed findings with patient who is pleased Recommend follow up in office  ASSESSMENT Single IUP at [redacted]w[redacted]d Pregnancy of unknown location Pelvic pain  Nausea  PLAN Discharge home Rx Diclegis  and Zofran  for nausea Encouraged to return if she develops worsening of symptoms, increase in pain, fever, or other concerning symptoms.    Pt stable at time of discharge. Encouraged to return here if she develops worsening of symptoms, increase in pain, fever, or other concerning symptoms.    Earnie Pouch CNM, MSN Certified Nurse-Midwife 12/19/2023  11:42 PM

## 2023-12-19 NOTE — MAU Note (Signed)
.  Debra Allen is a 38 y.o. at 8.3 weeks here in MAU reporting: for last 2 days feeling lower abdominal pain around to back and R side. New nausea and vomiting. Vaginal discharge-  LMP: 10/20/2024 Onset of complaint: 12/17/2023 Pain score: 7 Vitals:   12/19/23 2253  BP: 111/60  Pulse: 82  Resp: 17  Temp: 98 F (36.7 C)  SpO2: 100%      Lab orders placed from triage: see orders

## 2023-12-20 ENCOUNTER — Other Ambulatory Visit: Payer: Self-pay

## 2023-12-20 ENCOUNTER — Other Ambulatory Visit (HOSPITAL_COMMUNITY): Payer: Self-pay

## 2023-12-20 DIAGNOSIS — R109 Unspecified abdominal pain: Secondary | ICD-10-CM

## 2023-12-20 DIAGNOSIS — O3680X0 Pregnancy with inconclusive fetal viability, not applicable or unspecified: Secondary | ICD-10-CM

## 2023-12-20 DIAGNOSIS — O26891 Other specified pregnancy related conditions, first trimester: Secondary | ICD-10-CM

## 2023-12-20 DIAGNOSIS — Z3A08 8 weeks gestation of pregnancy: Secondary | ICD-10-CM

## 2023-12-20 DIAGNOSIS — O219 Vomiting of pregnancy, unspecified: Secondary | ICD-10-CM

## 2023-12-20 LAB — HCG, QUANTITATIVE, PREGNANCY: hCG, Beta Chain, Quant, S: 76703 m[IU]/mL — ABNORMAL HIGH (ref <5)

## 2023-12-20 LAB — WET PREP, GENITAL
Clue Cells Wet Prep HPF POC: NONE SEEN
Sperm: NONE SEEN
Trich, Wet Prep: NONE SEEN
WBC, Wet Prep HPF POC: <10 (ref <10)
Yeast Wet Prep HPF POC: NONE SEEN

## 2023-12-20 LAB — GC/CHLAMYDIA PROBE AMP (~~LOC~~) NOT AT ARMC
Chlamydia: NEGATIVE
Comment: NEGATIVE
Comment: NORMAL
Neisseria Gonorrhea: NEGATIVE

## 2023-12-20 LAB — HIV ANTIBODY (ROUTINE TESTING W REFLEX): HIV Screen 4th Generation wRfx: NONREACTIVE

## 2023-12-27 ENCOUNTER — Other Ambulatory Visit (HOSPITAL_COMMUNITY): Payer: Self-pay

## 2023-12-27 ENCOUNTER — Inpatient Hospital Stay (HOSPITAL_COMMUNITY)
Admission: AD | Admit: 2023-12-27 | Discharge: 2023-12-27 | Disposition: A | Discharge status: Home / Self Care | Payer: Self-pay | Attending: Obstetrics & Gynecology | Admitting: Obstetrics & Gynecology

## 2023-12-27 ENCOUNTER — Encounter (HOSPITAL_COMMUNITY): Payer: Self-pay | Admitting: Obstetrics & Gynecology

## 2023-12-27 DIAGNOSIS — O26891 Other specified pregnancy related conditions, first trimester: Secondary | ICD-10-CM

## 2023-12-27 DIAGNOSIS — G43909 Migraine, unspecified, not intractable, without status migrainosus: Secondary | ICD-10-CM | POA: Insufficient documentation

## 2023-12-27 DIAGNOSIS — Z3A09 9 weeks gestation of pregnancy: Secondary | ICD-10-CM

## 2023-12-27 DIAGNOSIS — R519 Headache, unspecified: Secondary | ICD-10-CM

## 2023-12-27 DIAGNOSIS — O99511 Diseases of the respiratory system complicating pregnancy, first trimester: Secondary | ICD-10-CM | POA: Insufficient documentation

## 2023-12-27 DIAGNOSIS — O09521 Supervision of elderly multigravida, first trimester: Secondary | ICD-10-CM | POA: Insufficient documentation

## 2023-12-27 DIAGNOSIS — B9789 Other viral agents as the cause of diseases classified elsewhere: Secondary | ICD-10-CM | POA: Insufficient documentation

## 2023-12-27 DIAGNOSIS — O99351 Diseases of the nervous system complicating pregnancy, first trimester: Secondary | ICD-10-CM | POA: Insufficient documentation

## 2023-12-27 DIAGNOSIS — J069 Acute upper respiratory infection, unspecified: Secondary | ICD-10-CM | POA: Insufficient documentation

## 2023-12-27 DIAGNOSIS — O210 Mild hyperemesis gravidarum: Secondary | ICD-10-CM

## 2023-12-27 DIAGNOSIS — O98511 Other viral diseases complicating pregnancy, first trimester: Secondary | ICD-10-CM | POA: Insufficient documentation

## 2023-12-27 LAB — URINALYSIS, ROUTINE W REFLEX MICROSCOPIC
Bilirubin Urine: NEGATIVE
Glucose, UA: NEGATIVE mg/dL
Hgb urine dipstick: NEGATIVE
Ketones, ur: NEGATIVE mg/dL
Leukocytes,Ua: NEGATIVE
Nitrite: NEGATIVE
Protein, ur: NEGATIVE mg/dL
Specific Gravity, Urine: 1.021 (ref 1.005–1.030)
pH: 5 (ref 5.0–8.0)

## 2023-12-27 LAB — RESP PANEL BY RT-PCR (RSV, FLU A&B, COVID)  RVPGX2
Influenza A by PCR: NEGATIVE
Influenza B by PCR: NEGATIVE
Resp Syncytial Virus by PCR: NEGATIVE
SARS Coronavirus 2 by RT PCR: NEGATIVE

## 2023-12-27 MED ORDER — LACTATED RINGERS IV SOLN: Freq: Once | INTRAVENOUS | Status: DC

## 2023-12-27 MED ORDER — DIPHENHYDRAMINE HCL 50 MG/ML IJ SOLN
25.0000 mg | Freq: Once | INTRAMUSCULAR | Status: AC
  Administered 2023-12-27: 25 mg via INTRAVENOUS
  Filled 2023-12-27: qty 1

## 2023-12-27 MED ORDER — ONDANSETRON HCL 4 MG/2ML IJ SOLN
4.0000 mg | Freq: Once | INTRAMUSCULAR | Status: AC
  Administered 2023-12-27: 4 mg via INTRAVENOUS
  Filled 2023-12-27: qty 2

## 2023-12-27 MED ORDER — METOCLOPRAMIDE HCL 5 MG/ML IJ SOLN
10.0000 mg | Freq: Once | INTRAMUSCULAR | Status: AC
  Administered 2023-12-27: 10 mg via INTRAVENOUS
  Filled 2023-12-27: qty 2

## 2023-12-27 MED ORDER — CAFFEINE 200 MG PO TABS
100.0000 mg | ORAL_TABLET | Freq: Once | ORAL | Status: AC
  Administered 2023-12-27: 100 mg via ORAL
  Filled 2023-12-27: qty 1

## 2023-12-27 MED ORDER — LACTATED RINGERS IV BOLUS
1000.0000 mL | Freq: Once | INTRAVENOUS | Status: AC
  Administered 2023-12-27: 1000 mL via INTRAVENOUS

## 2023-12-27 MED ORDER — FLUTICASONE PROPIONATE 50 MCG/ACT NA SUSP
2.0000 | Freq: Once | NASAL | Status: AC
  Administered 2023-12-27: 2 via NASAL
  Filled 2023-12-27: qty 16

## 2023-12-27 MED ORDER — ACETAMINOPHEN-CAFFEINE 500-65 MG PO TABS
2.0000 | ORAL_TABLET | Freq: Three times a day (TID) | ORAL | 0 refills | Status: DC | PRN
  Filled 2023-12-27: qty 60, 10d supply, fill #0

## 2023-12-27 NOTE — MAU Note (Addendum)
.  Debra Allen is a 38 y.o. at [redacted]w[redacted]d here in MAU reporting: N/V, fever, HA, dizziness, and congestion. HA's since this past Tuesday evening. She reports yesterday she also began to feel dizzy, feverish, N/V, and her HA was worsening. She reports a hx of migraines but unable to take those medications. She reports she has been taking Tylenol with no relief. HA is sensitive to light. Denies VB, LOF, or abnormal discharge. Denies urinary sx's. OB appointment tomorrow. 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  Last doses: Tylenol at 0430 this morning  Onset of complaint: This past Tuesday Pain score: 8/10 HA - left sided  Vitals:   12/27/23 0705  BP: 106/64  Pulse: 78  Resp: 15  Temp: 97.9 F (36.6 C)  SpO2: 100%   FHT: n/a Lab orders placed from triage: UA, Resp Panel

## 2023-12-27 NOTE — MAU Provider Note (Cosign Needed Addendum)
Chief Complaint: Nausea, Emesis, Headache, Dizziness, and Nasal Congestion   Event Date/Time   First Provider Initiated Contact with Patient 12/27/23 0724        SUBJECTIVE HPI: Debra Allen is a 38 y.o. [id number] at [redacted]w[redacted]d by LMP who presents to maternity admissions reporting migraine headache since Tuesday, unresponsive to Tylenol.  Has a history of migraines.  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 .She denies vaginal bleeding.  Single IUP was confirmed last week  New OB appt is tomorrow  Fever  This is a new problem. The current episode started in the past 7 days. The problem occurs intermittently. Associated symptoms include congestion, headaches, nausea and vomiting. Pertinent negatives include no abdominal pain, diarrhea or wheezing. She has tried acetaminophen for the symptoms. The treatment provided no relief.  Risk factors: sick contacts    RN Note Debra Allen is a 38 y.o. at [redacted]w[redacted]d here in MAU reporting: N/V, fever, HA, dizziness, and congestion. HA's since this past Tuesday evening. She reports yesterday she also began to feel dizzy, feverish, N/V, and her HA was worsening. She reports a hx of migraines but unable to take those medications. She reports she has been taking Tylenol with no relief. HA is sensitive to light. Denies VB, LOF, or abnormal discharge. Denies urinary sx's. OB appointment tomorrow. 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  Last doses: Tylenol at 0430 this morning  Onset of complaint: This past Tuesday                           Pain score: 8/10 HA - left sided   Past Medical History:  Diagnosis Date   Anxiety    Headache(784.0)    Migraine   Urinary tract infection    Past Surgical History:  Procedure Laterality Date   CHOLECYSTECTOMY N/A 09/24/2020   Procedure: LAPAROSCOPIC CHOLECYSTECTOMY;  Surgeon: Debra Miner, MD;  Location: [facility]  OR;  Service: General;  Laterality: N/A;   NO PAST SURGERIES     Social History   Socioeconomic History   Marital status: Single    Spouse name: Not on file   Number of children: Not on file   Years of education: Not on file   Highest education level: Not on file  Occupational History   Not on file  Tobacco Use   Smoking status: Never    Passive exposure: Never   Smokeless tobacco: Never  Vaping Use   Vaping status: Never Used  Substance and Sexual Activity   Alcohol use: No   Drug use: No   Sexual activity: Yes  Other Topics Concern   Not on file  Social History Narrative   Not on file   Social Drivers of Health   Financial Resource Strain: Not on file  Food Insecurity: Not on file  Transportation Needs: Not on file  Physical Activity: Not on file  Stress: Not on file  Social Connections: Not on file  Intimate Partner Violence: Not on file   No current facility-administered medications on file prior to encounter.   Current Outpatient Medications on File Prior to Encounter  Medication Sig Dispense Refill   acetaminophen (TYLENOL) 500 MG tablet Take 500 mg by mouth every 6 (six) hours as needed for mild pain (pain score 1-3) or moderate pain (pain score 4-6).     cetirizine (ZYRTEC) 10 MG tablet Take 1 tablet (  10 mg total) by mouth daily. 30 tablet 11   Doxylamine-Pyridoxine 10-10 MG TBEC Take 2 tabs at bedtime. If needed, add another tab in the morning. If needed, add another tab in the afternoon, up to 4 tabs/day. 120 tablet 0   famotidine (PEPCID) 40 MG tablet Take 1 tablet (40 mg total) by mouth once daily. 30 tablet 1   fluticasone (FLONASE) 50 MCG/ACT nasal spray Place 2 sprays into both nostrils daily. 16 g 6   hydrOXYzine (ATARAX) 25 MG tablet Take 1 tablet (25 mg total) by mouth at bedtime as needed. 30 tablet 0   ondansetron (ZOFRAN-ODT) 4 MG disintegrating tablet Dissolve 1 tablet (4 mg total) by mouth every 6 (six) hours as needed for nausea. 20 tablet 0    Vitamin D, Ergocalciferol, (DRISDOL) 1.25 MG (50000 UNIT) CAPS capsule Take 1 capsule (50,000 Units total) by mouth every 7 (seven) days. 4 capsule 2   Allergies  Allergen Reactions   Keflex [Cephalexin] Itching and Rash    I have reviewed patient's Past Medical Hx, Surgical Hx, Family Hx, Social Hx, medications and allergies.   ROS:  Review of Systems  Constitutional:  Positive for fever.  HENT:  Positive for congestion.   Respiratory:  Negative for wheezing.   Gastrointestinal:  Positive for nausea and vomiting. Negative for abdominal pain and diarrhea.  Neurological:  Positive for headaches.   Review of Systems  Other systems negative   Physical Exam  Physical Exam Patient Vitals for the past 24 hrs:  BP Temp Temp src Pulse Resp SpO2 Height Weight  12/27/23 0705 106/64 97.9 F (36.6 C) Oral 78 15 100 % 5\' 2"  (1.575 m) 86.2 kg   Constitutional: Well-developed, well-nourished female in no acute distress.  Cardiovascular: normal rate Respiratory: normal effort GI: Abd soft, non-tender.  MS: Extremities nontender, no edema, normal ROM Neurologic: Alert and oriented x 4.    LAB RESULTS Results for orders placed or performed during the hospital encounter of 12/27/23 (from the past 24 hours)  Resp panel by RT-PCR (RSV, Flu A&B, Covid) Anterior Nasal Swab     Status: None   Collection Time: 12/27/23  7:23 AM   Specimen: Anterior Nasal Swab  Result Value Ref Range   SARS Coronavirus 2 by RT PCR NEGATIVE NEGATIVE   Influenza A by PCR NEGATIVE NEGATIVE   Influenza B by PCR NEGATIVE NEGATIVE   Resp Syncytial Virus by PCR NEGATIVE NEGATIVE  Urinalysis, Routine w reflex microscopic -Urine, Clean Catch     Status: Abnormal   Collection Time: 12/27/23  7:31 AM  Result Value Ref Range   Color, Urine YELLOW YELLOW   APPearance HAZY (A) CLEAR   Specific Gravity, Urine 1.021 1.005 - 1.030   pH 5.0 5.0 - 8.0   Glucose, UA NEGATIVE NEGATIVE mg/dL   Hgb urine dipstick NEGATIVE  NEGATIVE   Bilirubin Urine NEGATIVE NEGATIVE   Ketones, ur NEGATIVE NEGATIVE mg/dL   Protein, ur NEGATIVE NEGATIVE mg/dL   Nitrite NEGATIVE NEGATIVE   Leukocytes,Ua NEGATIVE NEGATIVE    MAU Management/MDM: I have reviewed the triage vital signs and the nursing notes.   Pertinent labs & imaging results that were available during my care of the patient were reviewed by me and considered in my medical decision making (see chart for details).      I have reviewed her medical records including past results, notes and treatments. Medical, Surgical, and family history were reviewed.  Medications and recent lab tests were reviewed  Treatments in MAU included IV hydration, zofran for nausea and vomiting.  Reglan and Benadryl IV ordered for headache  Since she already took Tylenol at 4, will give 100mg  caffeine to augment Tylenol   Rx Flonase for nasal congestion.  URI viral test pending.   Will observe for effects of treatment Care turned over to oncoming provider  Wynelle Bourgeois CNM, MSN Certified Nurse-Midwife 12/27/2023  7:24 AM  Reassessment @ 0830: Patient feeling "cured" and ready for discharge home.  ASSESSMENT Single IUP at [redacted]w[redacted]d Migraine headache Nasal congestion/fever/chills c/w Viral URI  PLAN 1. Headache in pregnancy, antepartum, first trimester (Primary) - Prescription for: Excedrin Tension Headache 2 caplets  - Information provided on H/A form to record frequency and general H/A without cause   2. Morning sickness - Information provided on morning sickness  - Has Rx for Zofran  3. [redacted] weeks gestation of pregnancy   - Discharge patient - Keep scheduled appt with [facility] on 12/28/2023 - Patient verbalized an understanding of the plan of care and agrees.    Raelyn Mora, CNM  12/27/2023 8:35 AM

## 2023-12-28 ENCOUNTER — Encounter: Payer: Self-pay | Admitting: Obstetrics and Gynecology

## 2023-12-28 ENCOUNTER — Other Ambulatory Visit: Payer: Self-pay

## 2024-01-15 ENCOUNTER — Other Ambulatory Visit: Payer: Self-pay

## 2024-01-15 ENCOUNTER — Other Ambulatory Visit: Payer: Self-pay | Admitting: Student

## 2024-01-15 DIAGNOSIS — Z3481 Encounter for supervision of other normal pregnancy, first trimester: Secondary | ICD-10-CM

## 2024-01-16 ENCOUNTER — Telehealth: Payer: Self-pay

## 2024-01-17 LAB — CBC/D/PLT+RPR+RH+ABO+RUBIGG...
Antibody Screen: NEGATIVE
Basophils Absolute: 0 10*3/uL (ref 0.0–0.2)
Basos: 0 %
Bilirubin, UA: NEGATIVE
EOS (ABSOLUTE): 0.1 10*3/uL (ref 0.0–0.4)
Eos: 1 %
Glucose, UA: NEGATIVE
HCV Ab: NONREACTIVE
Hematocrit: 32.8 % — ABNORMAL LOW (ref 34.0–46.6)
Hemoglobin: 11 g/dL — ABNORMAL LOW (ref 11.1–15.9)
Hepatitis B Surface Ag: NEGATIVE
Immature Grans (Abs): 0 10*3/uL (ref 0.0–0.1)
Immature Granulocytes: 1 %
Ketones, UA: NEGATIVE
Lymphocytes Absolute: 1.3 10*3/uL (ref 0.7–3.1)
Lymphs: 23 %
MCH: 30.5 pg (ref 26.6–33.0)
MCHC: 33.5 g/dL (ref 31.5–35.7)
MCV: 91 fL (ref 79–97)
Monocytes Absolute: 0.2 10*3/uL (ref 0.1–0.9)
Monocytes: 4 %
Neutrophils Absolute: 3.8 10*3/uL (ref 1.4–7.0)
Neutrophils: 71 %
Nitrite, UA: NEGATIVE
Platelets: 123 10*3/uL — ABNORMAL LOW (ref 150–450)
RBC, UA: NEGATIVE
RBC: 3.61 x10E6/uL — ABNORMAL LOW (ref 3.77–5.28)
RDW: 13.7 % (ref 11.7–15.4)
RPR Ser Ql: NONREACTIVE
Rh Factor: POSITIVE
Rubella Antibodies, IGG: 17.1 {index} (ref >0.99)
Specific Gravity, UA: 1.023 (ref 1.005–1.030)
Urobilinogen, Ur: 0.2 mg/dL (ref 0.2–1.0)
WBC: 5.4 10*3/uL (ref 3.4–10.8)
pH, UA: 5.5 (ref 5.0–7.5)

## 2024-01-17 LAB — HGB FRACTIONATION CASCADE
Hgb A2: 2.5 % (ref 1.8–3.2)
Hgb A: 97.5 % (ref 96.4–98.8)
Hgb F: 0 % (ref 0.0–2.0)
Hgb S: 0 %

## 2024-01-17 LAB — URINE CULTURE, OB REFLEX

## 2024-01-17 LAB — MICROSCOPIC EXAMINATION
Casts: NONE SEEN /LPF
Epithelial Cells (non renal): >10 /HPF — AB (ref 0–10)
WBC, UA: >30 /HPF — AB (ref 0–5)

## 2024-01-17 LAB — HCV INTERPRETATION

## 2024-01-22 ENCOUNTER — Other Ambulatory Visit (HOSPITAL_COMMUNITY): Payer: Self-pay

## 2024-01-22 ENCOUNTER — Other Ambulatory Visit: Payer: Self-pay

## 2024-01-22 ENCOUNTER — Ambulatory Visit (INDEPENDENT_AMBULATORY_CARE_PROVIDER_SITE_OTHER): Payer: Self-pay | Admitting: Student

## 2024-01-22 ENCOUNTER — Encounter: Payer: Self-pay | Admitting: Student

## 2024-01-22 ENCOUNTER — Other Ambulatory Visit (HOSPITAL_COMMUNITY)
Admission: RE | Admit: 2024-01-22 | Discharge: 2024-01-22 | Disposition: A | Discharge status: Home / Self Care | Payer: Self-pay | Source: Ambulatory Visit | Attending: Family Medicine | Admitting: Family Medicine

## 2024-01-22 DIAGNOSIS — Z3A13 13 weeks gestation of pregnancy: Secondary | ICD-10-CM | POA: Insufficient documentation

## 2024-01-22 DIAGNOSIS — Z3491 Encounter for supervision of normal pregnancy, unspecified, first trimester: Secondary | ICD-10-CM | POA: Insufficient documentation

## 2024-01-22 LAB — POCT 1 HR PRENATAL GLUCOSE: Glucose 1 Hr Prenatal, POC: 138 mg/dL

## 2024-01-22 MED ORDER — ASPIRIN 81 MG PO TBEC
81.0000 mg | DELAYED_RELEASE_TABLET | Freq: Every day | ORAL | 12 refills | Status: AC
  Filled 2024-01-22: qty 30, 30d supply, fill #0

## 2024-01-22 NOTE — Progress Notes (Signed)
 Patient Name: Debra Allen Date of Birth: [birth year] [facility] Initial Prenatal Visit  Debra Allen is a 38 y.o. year old [id number] at [redacted]w[redacted]d who presents for her initial prenatal visit.   Has been experiencing nausea and vomiting with some decreased appetite  Also reports experiencing chronic headaches, which were present before the pregnancy. Has been managing the headaches with Tylenol.  Upper abdominal pain, particularly on the left side but sometimes right, which worsens with movement and at night. Describes the pain as dull.   Pregnancy is planned She reports nausea. She is taking a prenatal vitamin. She denies pelvic pain or vaginal bleeding.   Pregnancy Dating: The patient is dated by LMP.  LMP: 10/21/2023 Period is certain:  Yes.  Periods were regular:  Yes.  LMP was a typical period:  Yes.  Using hormonal contraception in 3 months prior to conception: No  Lab Review: Blood type: O Rh Status: + Antibody screen: Negative HIV: Negative RPR: Negative Hemoglobin electrophoresis reviewed: Yes Results of OB urine culture are: Negative Rubella: Immune Hep C Ab: Negative Varicella status is Immune  PMH: Reviewed and as detailed below: HTN: No  Gestational Hypertension/preeclampsia: No  Type 1 or 2 Diabetes: No  Depression:  No  Seizure disorder:  No VTE: No ,  History of STI No,  Abnormal Pap smear:  No, Genital herpes simplex:  No   PSH: Gynecologic Surgery:  no Surgical history reviewed, notable for: None---removed gallbladder  Obstetric History: Obstetric history tab updated and reviewed.  Summary of prior pregnancies: 4 vaginal deliveries, 1 miscarriage - no issues except 3rd vaginal delivery with excess bleeding and difficulty with descent ?episiotomy although not in delivery note Cesarean delivery: No  Gestational Diabetes:  No Hypertension in pregnancy: No History of preterm birth: No History of LGA/SGA infant:   No History of shoulder dystocia: No Indications for referral were reviewed, and the patient has no obstetric indications for referral to High Risk OB Clinic at this time.   Social History: Partner's name: Cervando  Tobacco use: No Alcohol use:  No Other substance use:  No  Current Medications:  Tylenol and prenatal  Reviewed and appropriate in pregnancy.   Genetic and Infection Screen: Flow Sheet Updated Yes  Prenatal Exam: Gen: Well nourished, well developed.  No distress.  Vitals noted. HEENT: Normocephalic, atraumatic.  Neck supple   CV: RRR no murmur, gallops or rubs Lungs: CTA B.  Normal respiratory effort without wheezes or rales. Abd: soft, mildly tender in upper abdomen, no gaurding or rebound, +BS.  GU: Normal external female genitalia without lesions.  Nl vaginal, well rugated without lesions. physiologic vaginal discharge.  Bimanual exam: No adnexal mass or TTP. No CMT.  Uterus size 13 week Ext: No clubbing, cyanosis or edema. Psych: Normal grooming and dress.  Not depressed or anxious appearing.  Normal thought content and process without flight of ideas or looseness of associations  Assessment/Plan:  Debra Allen is a 38 y.o. [id number] at [redacted]w[redacted]d who presents to initiate prenatal care. She is doing well.  Current pregnancy issues include  Routine prenatal care: As dating is reliable, a dating ultrasound has not been ordered. Dating tab updated. Prenatal labs reviewed, notable for anemia 11. Indications for referral to [facility] were reviewed and the patient does not meet criteria for referral.  Medication list reviewed and updated.  Recommended patient see a dentist for regular care.  Bleeding and pain precautions reviewed. Importance of prenatal vitamins reviewed.  Genetic screening offered. Patient  opted for: patient undecided, will address at future visit. Interested in genetic screening but will provide number for discussion of cost. The patient has the  following indications for aspirinto begin 81 mg at 12-16 weeks: One high risk condition: no single high risk condition  MORE than one moderate risk condition: obesity, family history of preeclampsia (mother or sister) , and Age 36 or older  Aspirin was  recommended today based upon above risk factors (one high risk condition or more than one moderate risk factor)  The patient will be age 32 or over at time of delivery. Referral to genetic counseling was offered today.  The patient has the following risk factors for preexisting diabetes: BMI > 25 and high risk ethnicity (Latino, [place] American, Native American, [place] Islander, Asian Naval architect) . An early 1 hour glucose tolerance test was ordered. Pregnancy Medical Home and PHQ-9 forms completed, problems noted: No     01/22/2024    2:18 PM 10/03/2023   11:16 AM 06/28/2023    2:32 PM 01/11/2023    9:24 AM 01/11/2023    9:02 AM  Depression screen PHQ 2/9  Decreased Interest 0 0 1  1  Down, Depressed, Hopeless 0 0 0  1  PHQ - 2 Score 0 0 1  2  Altered sleeping 1 2 2  1   Tired, decreased energy 1 2 2  2   Change in appetite 1 2 2  2   Feeling bad or failure about yourself  0 0 0  2  Trouble concentrating 0 2 2  2   Moving slowly or fidgety/restless 0 0 0    Suicidal thoughts 0 0 0 0 1  PHQ-9 Score 3 8 9  12   Difficult doing work/chores Not difficult at all Not difficult at all        2. Pregnancy issues include the following which were addressed today:  Mild anemia hgb 11 - consider iron panel at next visit Abd pain - upper, positional, possibly related to pregnancy itself. No dysuria, exam overall benign, no vaginal bleeding/discharge. ED/return precautions discussed Nausea- continue meds started by MAU Chronic headaches - tylenol/excedrin PRN 1 hour GTT ordered, G/C obtained, genetics referral placed, ASA ordered   Follow up 4 weeks for next prenatal visit.

## 2024-01-22 NOTE — Progress Notes (Signed)
 Notes: Came into NEW OB visit to congratulate and meet mom. Discussed how [employer] can provide connection to resources in the area that mom may not be aware of as well as offer support throughout the longevity of the pregnancy. Patient consented to Prenatal Navigation. Mom didn't express any concerns in the household currently but was grateful for the support. CN scheduled to chat with mom on 3/20.     Debra Allen Ladona Ridgel Community Navigator [facility] [employer] [facility] [place] Direct Dial: [phone]

## 2024-01-22 NOTE — Patient Instructions (Signed)
 Please make a follow up appointment in 4 weeks.  Please return sooner if cannot hold any food down , vomiting worsening or abdominal pain worsening  Prenatal Classes Go to [email or web address] for more information on the pregnancy and child birth classes that [facility] has to offer.   Pregnancy Related Return Precautions The follow are signs/symptoms that are abnormal in pregnancy and may require further evaluation by a physician: Go to the MAU at [facility] at [facility] if: You have cramping/contractions that do not go away with drinking water, especially if they are lasting 30 seconds to 1.5 minutes, coming and going every 5-10 minutes for an hour or more, or are getting stronger and you cannot walk or talk while having a contraction/cramp. Your water breaks.  Sometimes it is a big gush of fluid, sometimes it is just a trickle that keeps getting your underwear wet or running down your legs You have vaginal bleeding.    You do not feel your baby moving like normal.  If you do not, get something to eat and drink (something cold or something with sugar like peanut butter or juice) and lay down and focus on feeling your baby move. If your baby is still not moving like normal, you should go to MAU. You should feel your baby move 6 times in one hour, or 10 times in two hours. You have a persistent headache that does not go away with 1 g of Tylenol, vision changes, chest pain, difficulty breathing, severe pain in your right upper abdomen, worsening leg swelling- these can all be signs of high blood pressure in pregnancy and need to be evaluated by a provider immediately  These are all concerning in pregnancy and if you have any of these I recommend you call your PCP and present to the Maternity Admissions Unit (map below) for further evaluation.  For any pregnancy-related emergencies, please go to the Maternity Admissions Unit in the Women's &  Children's Center at [facility]. You will use hospital Entrance C.    Our clinic number is [phone].

## 2024-01-23 ENCOUNTER — Encounter: Payer: Self-pay | Admitting: Student

## 2024-01-23 ENCOUNTER — Other Ambulatory Visit (INDEPENDENT_AMBULATORY_CARE_PROVIDER_SITE_OTHER): Payer: Self-pay

## 2024-01-23 ENCOUNTER — Encounter: Payer: Self-pay | Admitting: Family Medicine

## 2024-01-23 DIAGNOSIS — Z3A13 13 weeks gestation of pregnancy: Secondary | ICD-10-CM

## 2024-01-23 LAB — POCT CBG (FASTING - GLUCOSE)-MANUAL ENTRY: Glucose Fasting, POC: 91 mg/dL (ref 70–99)

## 2024-01-23 LAB — CERVICOVAGINAL ANCILLARY ONLY
Chlamydia: NEGATIVE
Comment: NEGATIVE
Comment: NEGATIVE
Comment: NORMAL
Neisseria Gonorrhea: NEGATIVE
Trichomonas: NEGATIVE

## 2024-01-24 ENCOUNTER — Encounter: Payer: Self-pay | Admitting: Student

## 2024-01-24 LAB — GESTATIONAL GLUCOSE TOLERANCE
Glucose, Fasting: 88 mg/dL (ref 70–94)
Glucose, GTT - 1 Hour: 133 mg/dL (ref 70–179)
Glucose, GTT - 2 Hour: 105 mg/dL (ref 70–154)
Glucose, GTT - 3 Hour: 122 mg/dL (ref 70–139)

## 2024-01-25 ENCOUNTER — Encounter: Payer: Self-pay | Admitting: Student

## 2024-01-25 ENCOUNTER — Telehealth: Payer: Self-pay | Admitting: Student

## 2024-01-25 NOTE — Telephone Encounter (Signed)
 Called patient and confirmed DOB. Discussed normal 3 hour GTT. Also asked when her miscarriage in 2021 was---she believes between 10-11 weeks. Knows that it was not past 14 weeks. She also mentions some intermittent left upper abdominal pain and lower back pain - denies vomiting, fevers, leaking of fluid, cramping, contraction. Discussed we should see her earlier than 1 month - I have scheduled follow up with me on Tuesday PM. Discussed in the meantime if having any of those red flag symptoms above to go to MAU immediately. Patient agreed to this.

## 2024-01-29 ENCOUNTER — Ambulatory Visit (INDEPENDENT_AMBULATORY_CARE_PROVIDER_SITE_OTHER): Payer: Self-pay | Admitting: Student

## 2024-01-29 VITALS — BP 105/75 | HR 89 | Ht 62.0 in | Wt 191.0 lb

## 2024-01-29 DIAGNOSIS — Z3A14 14 weeks gestation of pregnancy: Secondary | ICD-10-CM

## 2024-01-29 DIAGNOSIS — R1010 Upper abdominal pain, unspecified: Secondary | ICD-10-CM

## 2024-01-29 MED ORDER — FAMOTIDINE 40 MG PO TABS: 40.0000 mg | ORAL_TABLET | Freq: Every day | ORAL | 1 refills | Status: DC

## 2024-01-29 NOTE — Progress Notes (Signed)
    SUBJECTIVE:   CHIEF COMPLAINT / HPI: Upper abdominal pain  Currently pregnant at [redacted]w[redacted]d  Presents with abdominal pain that is worse at night and located on the right upper side. During the day, the pain is on the left upper side. The pain is described as a dull ache, similar to the discomfort experienced after vomiting. The patient had a recent history of vomiting, but has not vomited since the previous Sunday. Suspects that the pain may be due to strained muscles from vomiting. The patient reports that the pain has lessened since she stopped vomiting, but a dull ache persists.  States she has had her gallbladder removed in past  The patient denies any issues with bowel movements and does not report constipation or hard stools. She also denies any recent feelings of morning nausea or acid reflux, which she had experienced the previous week.  Does mention chronic headaches that she manages with tylenol PRN  The patient denies any leaking of fluid, vaginal bleeding, cramping, or contractions. She also denies any urinary issues.   PERTINENT  PMH / PSH: reviewed  OBJECTIVE:   BP 105/75   Pulse 89   Ht 5\' 2"  (1.575 m)   Wt 191 lb (86.6 kg)   LMP 10/21/2023 (Approximate)   SpO2 98%   BMI 34.93 kg/m   General: Well appearing, NAD, awake, alert, responsive to questions Head: Normocephalic atraumatic Respiratory: chest rises symmetrically,  no increased work of breathing Abdomen: Soft, mildly tender in epigastric region  FHT 156--had significant difficulty auscultating with Doppler-preceptor Eniola and Brown helped to obtain fetal heart tone after POCUS  ASSESSMENT/PLAN:   Assessment & Plan Upper abdominal pain Musculoskeletal from retching versus reflux.  Well-appearing on examination -Start Pepcid 40 mg daily -ED/return precautions discussed   Will work on [employer] for genetic screening which she requests after she spoke with adopt a mom  Levin Erp, MD [facility]

## 2024-01-29 NOTE — Patient Instructions (Signed)
 It was great to see you! Thank you for allowing me to participate in your care!   Our plans for today:  - Your exam is most consistent with muscular strain versus reflux symptoms - I am sending in pepcid to start as well  Prenatal Classes Go to [email or web address] for more information on the pregnancy and child birth classes that [facility] has to offer.   Pregnancy Related Return Precautions The follow are signs/symptoms that are abnormal in pregnancy and may require further evaluation by a physician: Go to the MAU at [facility]'s Center at [facility] if: You have cramping/contractions that do not go away with drinking water, especially if they are lasting 30 seconds to 1.5 minutes, coming and going every 5-10 minutes for an hour or more, or are getting stronger and you cannot walk or talk while having a contraction/cramp. Your water breaks.  Sometimes it is a big gush of fluid, sometimes it is just a trickle that keeps getting your underwear wet or running down your legs You have vaginal bleeding.    You have a persistent headache that does not go away with 1 g of Tylenol, vision changes, chest pain, difficulty breathing, severe pain in your right upper abdomen, worsening leg swelling- these can all be signs of high blood pressure in pregnancy and need to be evaluated by a provider immediately  These are all concerning in pregnancy and if you have any of these I recommend you call your PCP and present to the Maternity Admissions Unit (map below) for further evaluation.  For any pregnancy-related emergencies, please go to the Maternity Admissions Unit in the Women's & Children's Center at [facility]. You will use hospital Entrance C.    Our clinic number is [phone].     Take care and seek immediate care sooner if you develop any concerns.  Levin Erp, MD

## 2024-01-31 ENCOUNTER — Telehealth: Payer: Self-pay

## 2024-02-01 ENCOUNTER — Telehealth: Payer: Self-pay | Admitting: Student

## 2024-02-01 ENCOUNTER — Other Ambulatory Visit (HOSPITAL_COMMUNITY): Payer: Self-pay

## 2024-02-01 NOTE — Addendum Note (Signed)
 Addended by: Levin Erp on: 02/01/2024 05:16 PM   Modules accepted: Orders

## 2024-02-01 NOTE — Telephone Encounter (Signed)
 Spoke with patient at last visit about genetic testing. Pt has spoken with Thressa Sheller and would like to proceed with testing. I spoke with Dr. Miquel Dunn - will order panorama as normal here. Routing to CMA team to get pt scheduled for lab visit

## 2024-02-05 NOTE — Telephone Encounter (Signed)
 Notes: Reached out to client to address any current questions, concerns or resource needs through [employer].  Support for Health Care- Client has established a PCP for herself but is in need of assistance establishing dental care. Children are seeing pediatric dds. No transportation needs disclosed. Interest expressed in lactation resources as she is wanting to try breastfeeding the new baby instead of using formula like she has in the past.   Support for a Safe Home- No safety or substance use concerns disclosed. Family is currently receiving WIC and EBT benefits - declined add't food pantry information. Help affording new items for baby such as: clothing, car seat and crib will be beneficial for mom as she is not currently sure if she will be able to afford the items baby needs. Reports falling behind on light bill and needing resources for utility assistance.  Unable to finish assessing household needs - client had to get off the call and did not call back at time stated.  Resources/ Materials provided: [employer], [employer], [facility] Childproofing Handout, [facility] Dental List, [facility] Virtual Breastfeeding Class, [employer], WIC Lactation Services, [employer], [employer]  Rasool Rommel [employer] Navigator [facility] [employer] [place] Direct Dial: [phone]

## 2024-02-08 ENCOUNTER — Inpatient Hospital Stay (HOSPITAL_COMMUNITY)
Admission: AD | Admit: 2024-02-08 | Discharge: 2024-02-09 | Disposition: A | Discharge status: Home / Self Care | Payer: Self-pay | Attending: Obstetrics and Gynecology | Admitting: Obstetrics and Gynecology

## 2024-02-08 DIAGNOSIS — M549 Dorsalgia, unspecified: Secondary | ICD-10-CM | POA: Insufficient documentation

## 2024-02-08 DIAGNOSIS — N390 Urinary tract infection, site not specified: Secondary | ICD-10-CM | POA: Insufficient documentation

## 2024-02-08 DIAGNOSIS — Z3A15 15 weeks gestation of pregnancy: Secondary | ICD-10-CM | POA: Insufficient documentation

## 2024-02-08 DIAGNOSIS — R1030 Lower abdominal pain, unspecified: Secondary | ICD-10-CM | POA: Insufficient documentation

## 2024-02-08 DIAGNOSIS — R1010 Upper abdominal pain, unspecified: Secondary | ICD-10-CM | POA: Insufficient documentation

## 2024-02-08 DIAGNOSIS — B9689 Other specified bacterial agents as the cause of diseases classified elsewhere: Secondary | ICD-10-CM | POA: Insufficient documentation

## 2024-02-08 DIAGNOSIS — O2342 Unspecified infection of urinary tract in pregnancy, second trimester: Secondary | ICD-10-CM | POA: Insufficient documentation

## 2024-02-08 DIAGNOSIS — O26892 Other specified pregnancy related conditions, second trimester: Secondary | ICD-10-CM | POA: Insufficient documentation

## 2024-02-08 LAB — URINALYSIS, ROUTINE W REFLEX MICROSCOPIC
Bilirubin Urine: NEGATIVE
Glucose, UA: NEGATIVE mg/dL
Hgb urine dipstick: NEGATIVE
Ketones, ur: 5 mg/dL — AB
Nitrite: NEGATIVE
Protein, ur: NEGATIVE mg/dL
Specific Gravity, Urine: 1.026 (ref 1.005–1.030)
pH: 5 (ref 5.0–8.0)

## 2024-02-08 NOTE — MAU Provider Note (Signed)
 Chief Complaint: Abdominal Pain and Back Pain   Event Date/Time   First Provider Initiated Contact with Patient 02/08/24 2301      SUBJECTIVE HPI: Debra Allen is a 38 y.o. [id number] at [redacted]w[redacted]d who presents to maternity admissions reporting abdominal cramping x 2 days. Today, the pain in her lower abdomen started to radiate around to her back on both sides. She also has an upper abdominal pain for several weeks, that she thought was related to vomiting, but it is still present even though her n/v is improved.  There is soreness pain around her umbilicus as well. She has taken Tylenol and used a heating pad for her pain and it helps, but the pain returns.  She reports a hx of frequent UTI in her previous pregnancies.  HPI  Past Medical History:  Diagnosis Date   Anxiety    Headache(784.0)    Migraine   Urinary tract infection    Past Surgical History:  Procedure Laterality Date   CHOLECYSTECTOMY N/A 09/24/2020   Procedure: LAPAROSCOPIC CHOLECYSTECTOMY;  Surgeon: Griselda Miner, MD;  Location: [facility] OR;  Service: General;  Laterality: N/A;   NO PAST SURGERIES     Social History   Socioeconomic History   Marital status: Single    Spouse name: Not on file   Number of children: Not on file   Years of education: Not on file   Highest education level: Not on file  Occupational History   Not on file  Tobacco Use   Smoking status: Never    Passive exposure: Never   Smokeless tobacco: Never  Vaping Use   Vaping status: Never Used  Substance and Sexual Activity   Alcohol use: No   Drug use: No   Sexual activity: Yes  Other Topics Concern   Not on file  Social History Narrative   Not on file   Social Drivers of Health   Financial Resource Strain: Not on file  Food Insecurity: Not on file  Transportation Needs: Not on file  Physical Activity: Not on file  Stress: Not on file  Social Connections: Not on file  Intimate Partner Violence: Not on file   No current  facility-administered medications on file prior to encounter.   Current Outpatient Medications on File Prior to Encounter  Medication Sig Dispense Refill   acetaminophen (TYLENOL) 500 MG tablet Take 500 mg by mouth every 6 (six) hours as needed for mild pain (pain score 1-3) or moderate pain (pain score 4-6).     acetaminophen-caffeine (EXCEDRIN TENSION HEADACHE) 500-65 MG TABS per tablet Take 2 tablets by mouth every 8 (eight) hours as needed. 60 tablet 0   aspirin EC 81 MG tablet Take 1 tablet (81 mg total) by mouth daily. Swallow whole. 30 tablet 12   cetirizine (ZYRTEC) 10 MG tablet Take 1 tablet (10 mg total) by mouth daily. 30 tablet 11   Doxylamine-Pyridoxine 10-10 MG TBEC Take 2 tabs at bedtime. If needed, add another tab in the morning. If needed, add another tab in the afternoon, up to 4 tabs/day. 120 tablet 0   famotidine (PEPCID) 40 MG tablet Take 1 tablet (40 mg total) by mouth once daily. 30 tablet 1   fluticasone (FLONASE) 50 MCG/ACT nasal spray Place 2 sprays into both nostrils daily. 16 g 6   hydrOXYzine (ATARAX) 25 MG tablet Take 1 tablet (25 mg total) by mouth at bedtime as needed. 30 tablet 0   ondansetron (ZOFRAN-ODT) 4 MG disintegrating tablet Dissolve 1  tablet (4 mg total) by mouth every 6 (six) hours as needed for nausea. 20 tablet 0   Vitamin D, Ergocalciferol, (DRISDOL) 1.25 MG (50000 UNIT) CAPS capsule Take 1 capsule (50,000 Units total) by mouth every 7 (seven) days. 4 capsule 2   Allergies  Allergen Reactions   Keflex [Cephalexin] Itching and Rash    ROS:  Review of Systems   I have reviewed patient's Past Medical Hx, Surgical Hx, Family Hx, Social Hx, medications and allergies.   Physical Exam  Patient Vitals for the past 24 hrs:  BP Temp Temp src Pulse Resp Height Weight  02/08/24 2150 104/65 98.1 F (36.7 C) Oral 87 14 5\' 3"  (1.6 m) 87.4 kg   Constitutional: Well-developed, well-nourished female in no acute distress.  Cardiovascular: normal  rate Respiratory: normal effort GI: Abd soft, non-tender. Pos BS x 4 MS: Extremities nontender, no edema, normal ROM Neurologic: Alert and oriented x 4.  GU: Neg CVAT.  PELVIC EXAM:    FHT 150 by doppler  LAB RESULTS Results for orders placed or performed during the hospital encounter of 02/08/24 (from the past 24 hours)  Urinalysis, Routine w reflex microscopic -Urine, Clean Catch     Status: Abnormal   Collection Time: 02/08/24 10:15 PM  Result Value Ref Range   Color, Urine YELLOW YELLOW   APPearance HAZY (A) CLEAR   Specific Gravity, Urine 1.026 1.005 - 1.030   pH 5.0 5.0 - 8.0   Glucose, UA NEGATIVE NEGATIVE mg/dL   Hgb urine dipstick NEGATIVE NEGATIVE   Bilirubin Urine NEGATIVE NEGATIVE   Ketones, ur 5 (A) NEGATIVE mg/dL   Protein, ur NEGATIVE NEGATIVE mg/dL   Nitrite NEGATIVE NEGATIVE   Leukocytes,Ua MODERATE (A) NEGATIVE   RBC / HPF 0-5 0 - 5 RBC/hpf   WBC, UA 11-20 0 - 5 WBC/hpf   Bacteria, UA MANY (A) NONE SEEN   Squamous Epithelial / HPF 11-20 0 - 5 /HPF   Mucus PRESENT   Wet prep, genital     Status: Abnormal   Collection Time: 02/08/24 11:50 PM  Result Value Ref Range   Yeast Wet Prep HPF POC NONE SEEN NONE SEEN   Trich, Wet Prep NONE SEEN NONE SEEN   Clue Cells Wet Prep HPF POC NONE SEEN NONE SEEN   WBC, Wet Prep HPF POC >=10 (A) <10   Sperm NONE SEEN     O/Positive/-- (03/04 0847)  IMAGING No results found.  MAU Management/MDM: Orders Placed This Encounter  Procedures   Wet prep, genital   Urinalysis, Routine w reflex microscopic -Urine, Clean Catch   Discharge patient Discharge disposition: 01-Home or Self Care; Discharge patient date: 02/09/2024    Meds ordered this encounter  Medications   sulfamethoxazole-trimethoprim (BACTRIM DS) 800-160 MG per tablet 1 tablet   sulfamethoxazole-trimethoprim (BACTRIM DS) 800-160 MG tablet    Sig: Take 1 tablet by mouth 2 (two) times daily for 7 days.    Dispense:  13 tablet    Refill:  0    First dose  given in the hospital 02/09/24    Supervising Provider:   [place] Bing [[id number]]    Pt without acute abdomen, cervix closed/thick with no signs of preterm labor.  Wet prep negative.   Discussed pt abdominal exam, possible mild umbilical/abdominal hernia but not severe. Pt was told she had fibroids, but none seen on her earlier 8 week [place] so unlikely source of pain.  UA with moderate leukocytes, and hx of frequent UTI.  Will treat  with abx, pt allergic to cephalosporins.  Given second trimester, Bactrim DS BID x 7 days, first dose given in MAU.  Pt to keep scheduled appts with [facility], and return to MAU as needed for emergencies.     ASSESSMENT 1. Abdominal pain during pregnancy in second trimester   2. Urinary tract infection in mother during second trimester of pregnancy   3. [redacted] weeks gestation of pregnancy     PLAN Discharge home Allergies as of 02/09/2024       Reactions   Keflex [cephalexin] Itching, Rash        Medication List     TAKE these medications    acetaminophen 500 MG tablet Commonly known as: TYLENOL Take 500 mg by mouth every 6 (six) hours as needed for mild pain (pain score 1-3) or moderate pain (pain score 4-6).   acetaminophen-caffeine 500-65 MG Tabs per tablet Commonly known as: EXCEDRIN TENSION HEADACHE Take 2 tablets by mouth every 8 (eight) hours as needed.   aspirin EC 81 MG tablet Take 1 tablet (81 mg total) by mouth daily. Swallow whole.   cetirizine 10 MG tablet Commonly known as: ZYRTEC Take 1 tablet (10 mg total) by mouth daily.   Doxylamine-Pyridoxine 10-10 MG Tbec Take 2 tabs at bedtime. If needed, add another tab in the morning. If needed, add another tab in the afternoon, up to 4 tabs/day.   famotidine 40 MG tablet Commonly known as: Pepcid Take 1 tablet (40 mg total) by mouth once daily.   fluticasone 50 MCG/ACT nasal spray Commonly known as: FLONASE Place 2 sprays into both nostrils daily.   hydrOXYzine 25 MG tablet Commonly known  as: ATARAX Take 1 tablet (25 mg total) by mouth at bedtime as needed.   ondansetron 4 MG disintegrating tablet Commonly known as: ZOFRAN-ODT Dissolve 1 tablet (4 mg total) by mouth every 6 (six) hours as needed for nausea.   sulfamethoxazole-trimethoprim 800-160 MG tablet Commonly known as: BACTRIM DS Take 1 tablet by mouth 2 (two) times daily for 7 days.   Vitamin D (Ergocalciferol) 1.25 MG (50000 UNIT) Caps capsule Commonly known as: DRISDOL Take 1 capsule (50,000 Units total) by mouth every 7 (seven) days.         Sharen Counter Certified Nurse-Midwife 02/09/2024  12:33 AM

## 2024-02-08 NOTE — MAU Note (Signed)
 Pt says has abd pain - radiated to back pain- started Wed 6/10. She took XS Tyl 1 tab - at 0700 on Wed- helped some . Pain has continued - through Thursday - worse 8/10- Some more Tyl  and ice pack on her back - helped  But today pain is left abd / back and down left leg  Took XS Tyl  1 tab at 5pm - 9/10. Now- 8/10. [facility] - called yesterday . Said no appointments - but she has an appointment on Monday. Debra Allen

## 2024-02-09 LAB — WET PREP, GENITAL
Clue Cells Wet Prep HPF POC: NONE SEEN
Sperm: NONE SEEN
Trich, Wet Prep: NONE SEEN
WBC, Wet Prep HPF POC: >=10 — AB (ref <10)
Yeast Wet Prep HPF POC: NONE SEEN

## 2024-02-09 MED ORDER — SULFAMETHOXAZOLE-TRIMETHOPRIM 800-160 MG PO TABS: 1.0000 | ORAL_TABLET | Freq: Two times a day (BID) | ORAL | 0 refills | Status: AC

## 2024-02-09 MED ORDER — SULFAMETHOXAZOLE-TRIMETHOPRIM 800-160 MG PO TABS
1.0000 | ORAL_TABLET | Freq: Once | ORAL | Status: AC
  Administered 2024-02-09: 1 via ORAL
  Filled 2024-02-09: qty 1

## 2024-02-10 LAB — CULTURE, OB URINE

## 2024-02-11 LAB — GC/CHLAMYDIA PROBE AMP (~~LOC~~) NOT AT ARMC
Chlamydia: NEGATIVE
Comment: NEGATIVE
Comment: NORMAL
Neisseria Gonorrhea: NEGATIVE

## 2024-02-18 ENCOUNTER — Ambulatory Visit (INDEPENDENT_AMBULATORY_CARE_PROVIDER_SITE_OTHER): Payer: Self-pay | Admitting: Student

## 2024-02-18 VITALS — BP 106/70 | HR 83 | Wt 192.4 lb

## 2024-02-18 DIAGNOSIS — Z3480 Encounter for supervision of other normal pregnancy, unspecified trimester: Secondary | ICD-10-CM | POA: Insufficient documentation

## 2024-02-18 DIAGNOSIS — Z3A17 17 weeks gestation of pregnancy: Secondary | ICD-10-CM

## 2024-02-18 DIAGNOSIS — Z3A14 14 weeks gestation of pregnancy: Secondary | ICD-10-CM

## 2024-02-18 MED ORDER — PRENATAL MULTIVITAMIN CH: 1.0000 | ORAL_TABLET | Freq: Every day | ORAL | 1 refills | Status: AC

## 2024-02-18 NOTE — Patient Instructions (Addendum)
 Please make a follow up appointment in 4 weeks.  You can take 650 mg tylenol every 6 hours for headache-return to care if vision changes, vomiting  [facility] at [facility]    Prenatal Classes Go to [email or web address] for more information on the pregnancy and child birth classes that [place] has to offer.   Pregnancy Related Return Precautions The follow are signs/symptoms that are abnormal in pregnancy and may require further evaluation by a physician: Go to the MAU at [facility] if: You have cramping/contractions that do not go away with drinking water, especially if they are lasting 30 seconds to 1.5 minutes, coming and going every 5-10 minutes for an hour or more, or are getting stronger and you cannot walk or talk while having a contraction/cramp. Your water breaks.  Sometimes it is a big gush of fluid, sometimes it is just a trickle that keeps getting your underwear wet or running down your legs You have vaginal bleeding.    You do not feel your baby moving like normal.  If you do not, get something to eat and drink (something cold or something with sugar like peanut butter or juice) and lay down and focus on feeling your baby move. If your baby is still not moving like normal, you should go to MAU. You should feel your baby move 6 times in one hour, or 10 times in two hours. You have a persistent headache that does not go away with 1 g of Tylenol, vision changes, chest pain, difficulty breathing, severe pain in your right upper abdomen, worsening leg swelling- these can all be signs of high blood pressure in pregnancy and need to be evaluated by a provider immediately  These are all concerning in pregnancy and if you have any of these I recommend you call your PCP and present to the Maternity Admissions Unit (map below) for further evaluation.  For any pregnancy-related emergencies, please go to  the Maternity Admissions Unit in the [facility] at [facility]. You will use hospital Entrance C.    Our clinic number is [phone].

## 2024-02-18 NOTE — Progress Notes (Signed)
 Patient Name: Debra Allen Date of Birth: 10-21-1986 [facility] Prenatal Visit  Debra Allen is a 38 y.o. [id number] at [redacted]w[redacted]d here for routine follow up. She is dated by LMP.  She reports headache.  She denies vaginal bleeding.  See flow sheet for details.  Discussed the use of AI scribe software for clinical note transcription with the patient, who gave verbal consent to proceed.  History of Present Illness She reports having headaches for four to five days out of the week, which are not relieved by Tylenol. The headaches are severe, causing difficulty opening her eyes and are associated with sensitivity to light and sound. The headaches can last for hours or even continue for two days.  In addition to the headaches, the patient also reports a recent visit to the emergency room due to severe lower abdominal pain and bloody discharge. The pain has since resolved and there has been no further bleeding. The patient denies any contractions, cramping, or leaking of fluid. The patient also reports a history of vomiting, which has since resolved.  The patient is currently pregnant and has been taking aspirin and prenatal vitamins. She has also been prescribed acetaminophen (one 500 mg tablet for the headaches).    Vitals:   02/18/24 1416  BP: 106/70  Pulse: 83   General: Well appearing, NAD, awake, alert, responsive to questions Respiratory:  chest rises symmetrically,  no increased work of breathing Neuro: CN II: PERRL CN III, IV,VI: EOMI CV V: Normal sensation in V1, V2, V3 CVII: Symmetric smile and brow raise CN VIII: Normal hearing CN IX,X: Symmetric palate raise  CN XI: 5/5 shoulder shrug CN XII: Symmetric tongue protrusion  UE and LE strength 5/5 Normal sensation in UE and LE bilaterally   A/P: Pregnancy at [redacted]w[redacted]d.  Doing well.    Routine Prenatal Care:  Dating reviewed, dating tab is correct Fetal heart tones Appropriate Influenza vaccine  previously administered.   COVID vaccination was not discussed The patient has the following indication for screening preexisting diabetes: 3 hours normal Anatomy ultrasound ordered to be scheduled at 18-20 weeks. Patient is interested in genetic screening. As she is past 13 weeks and 6 days, a Quad screen  was offered.  Pregnancy education including expected weight gain in pregnancy, OTC medication use, continued use of prenatal vitamin, smoking cessation if applicable, and nutrition in pregnancy.   Bleeding and pain precautions reviewed. The patient has the following indications for aspirinto begin 81 mg at 12-16 weeks: One high risk condition: no single high risk condition  MORE than one moderate risk condition: obesity, latina Aspirin was  recommended today based upon above risk factors (one high risk condition or more than one moderate risk factor)   2. Pregnancy issues include the following and were addressed as appropriate today:  Headaches- off of her chronic migraine medication and having increase in headaches during pregnancy.  She is only taking 1 500 mg Tylenol daily-discussed that she can take this more frequently.  Also discussed she is able to take Excedrin if needed as she feels the caffeine component is helpful if we limit the caffeine to less than 200 mg daily.  Neurologic examination normal and no red flag symptoms. Abdominal pain and episode of bleeding (resolved)-went to MAU recently for this, no recurrence of bleeding or abdominal pain.  No vomiting.  At scheduled her anatomy ultrasound at health department on 4/14 at 11 AM.  ED/return precautions discussed Obtaining panorama lab testing today  per patient preference, discussed with [facility] Problem list  and pregnancy box updated: Yes.   Follow up 4 weeks.

## 2024-02-21 NOTE — Telephone Encounter (Signed)
 Notes: Reached out to client via telephone call to finish uncompleted section of Guided Conversation. Unable to reach - left voicemail req call back or text with another date/time that works so CN can support family with any needs not discussed.  Starsha Morning Ladona Ridgel Community Navigator [facility] [employer] [place] Direct Dial: [phone]

## 2024-02-23 LAB — PANORAMA PRENATAL TEST FULL PANEL:PANORAMA TEST PLUS 5 ADDITIONAL MICRODELETIONS: FETAL FRACTION: 5.9

## 2024-02-26 ENCOUNTER — Encounter: Payer: Self-pay | Admitting: Student

## 2024-03-04 ENCOUNTER — Other Ambulatory Visit (HOSPITAL_COMMUNITY): Payer: Self-pay

## 2024-03-09 ENCOUNTER — Other Ambulatory Visit: Payer: Self-pay

## 2024-03-09 ENCOUNTER — Inpatient Hospital Stay (HOSPITAL_COMMUNITY)
Admission: AD | Admit: 2024-03-09 | Discharge: 2024-03-09 | Disposition: A | Discharge status: Home / Self Care | Payer: Self-pay | Attending: Obstetrics and Gynecology | Admitting: Obstetrics and Gynecology

## 2024-03-09 DIAGNOSIS — O26892 Other specified pregnancy related conditions, second trimester: Secondary | ICD-10-CM

## 2024-03-09 DIAGNOSIS — R109 Unspecified abdominal pain: Secondary | ICD-10-CM

## 2024-03-09 DIAGNOSIS — M7918 Myalgia, other site: Secondary | ICD-10-CM | POA: Insufficient documentation

## 2024-03-09 DIAGNOSIS — O99891 Other specified diseases and conditions complicating pregnancy: Secondary | ICD-10-CM | POA: Insufficient documentation

## 2024-03-09 DIAGNOSIS — F5104 Psychophysiologic insomnia: Secondary | ICD-10-CM | POA: Insufficient documentation

## 2024-03-09 DIAGNOSIS — O26899 Other specified pregnancy related conditions, unspecified trimester: Secondary | ICD-10-CM

## 2024-03-09 DIAGNOSIS — Z3492 Encounter for supervision of normal pregnancy, unspecified, second trimester: Secondary | ICD-10-CM

## 2024-03-09 DIAGNOSIS — F419 Anxiety disorder, unspecified: Secondary | ICD-10-CM | POA: Insufficient documentation

## 2024-03-09 DIAGNOSIS — O99342 Other mental disorders complicating pregnancy, second trimester: Secondary | ICD-10-CM | POA: Insufficient documentation

## 2024-03-09 DIAGNOSIS — Z3A20 20 weeks gestation of pregnancy: Secondary | ICD-10-CM

## 2024-03-09 DIAGNOSIS — F32A Depression, unspecified: Secondary | ICD-10-CM | POA: Insufficient documentation

## 2024-03-09 LAB — CBC WITH DIFFERENTIAL/PLATELET
Abs Immature Granulocytes: 0.17 10*3/uL — ABNORMAL HIGH (ref 0.00–0.07)
Basophils Absolute: 0 10*3/uL (ref 0.0–0.1)
Basophils Relative: 0 %
Eosinophils Absolute: 0.1 10*3/uL (ref 0.0–0.5)
Eosinophils Relative: 1 %
HCT: 29.9 % — ABNORMAL LOW (ref 36.0–46.0)
Hemoglobin: 10 g/dL — ABNORMAL LOW (ref 12.0–15.0)
Immature Granulocytes: 2 %
Lymphocytes Relative: 20 %
Lymphs Abs: 1.6 10*3/uL (ref 0.7–4.0)
MCH: 30.1 pg (ref 26.0–34.0)
MCHC: 33.4 g/dL (ref 30.0–36.0)
MCV: 90.1 fL (ref 80.0–100.0)
Monocytes Absolute: 0.5 10*3/uL (ref 0.1–1.0)
Monocytes Relative: 7 %
Neutro Abs: 5.4 10*3/uL (ref 1.7–7.7)
Neutrophils Relative %: 70 %
Platelets: 192 10*3/uL (ref 150–400)
RBC: 3.32 MIL/uL — ABNORMAL LOW (ref 3.87–5.11)
RDW: 13.5 % (ref 11.5–15.5)
WBC: 7.8 10*3/uL (ref 4.0–10.5)
nRBC: 0 % (ref 0.0–0.2)

## 2024-03-09 LAB — URINALYSIS, ROUTINE W REFLEX MICROSCOPIC
Bilirubin Urine: NEGATIVE
Glucose, UA: NEGATIVE mg/dL
Hgb urine dipstick: NEGATIVE
Ketones, ur: NEGATIVE mg/dL
Nitrite: NEGATIVE
Protein, ur: 30 mg/dL — AB
Specific Gravity, Urine: 1.027 (ref 1.005–1.030)
pH: 5 (ref 5.0–8.0)

## 2024-03-09 LAB — COMPREHENSIVE METABOLIC PANEL WITH GFR
ALT: 25 U/L (ref 0–44)
AST: 18 U/L (ref 15–41)
Albumin: 3.1 g/dL — ABNORMAL LOW (ref 3.5–5.0)
Alkaline Phosphatase: 52 U/L (ref 38–126)
Anion gap: 8 (ref 5–15)
BUN: 7 mg/dL (ref 6–20)
CO2: 22 mmol/L (ref 22–32)
Calcium: 8.8 mg/dL — ABNORMAL LOW (ref 8.9–10.3)
Chloride: 106 mmol/L (ref 98–111)
Creatinine, Ser: 0.49 mg/dL (ref 0.44–1.00)
GFR, Estimated: >60 mL/min (ref >60)
Glucose, Bld: 111 mg/dL — ABNORMAL HIGH (ref 70–99)
Potassium: 3.4 mmol/L — ABNORMAL LOW (ref 3.5–5.1)
Sodium: 136 mmol/L (ref 135–145)
Total Bilirubin: 0.7 mg/dL (ref 0.0–1.2)
Total Protein: 6.1 g/dL — ABNORMAL LOW (ref 6.5–8.1)

## 2024-03-09 MED ORDER — CYCLOBENZAPRINE HCL 5 MG PO TABS
5.0000 mg | ORAL_TABLET | Freq: Three times a day (TID) | ORAL | 0 refills | Status: DC | PRN
  Filled 2024-03-09: qty 20, 7d supply, fill #0

## 2024-03-09 MED ORDER — ALUM & MAG HYDROXIDE-SIMETH 200-200-20 MG/5ML PO SUSP
30.0000 mL | Freq: Once | ORAL | Status: AC
  Administered 2024-03-09: 30 mL via ORAL
  Filled 2024-03-09: qty 30

## 2024-03-09 MED ORDER — CYCLOBENZAPRINE HCL 5 MG PO TABS
10.0000 mg | ORAL_TABLET | Freq: Once | ORAL | Status: AC
  Administered 2024-03-09: 10 mg via ORAL
  Filled 2024-03-09: qty 2

## 2024-03-09 MED ORDER — FLUOXETINE HCL 10 MG PO TABS
10.0000 mg | ORAL_TABLET | Freq: Every day | ORAL | 1 refills | Status: DC
  Filled 2024-03-09: qty 30, 30d supply, fill #0

## 2024-03-09 MED ORDER — HYDROXYZINE HCL 25 MG PO TABS
25.0000 mg | ORAL_TABLET | Freq: Three times a day (TID) | ORAL | 0 refills | Status: DC | PRN
Start: 2024-03-09 — End: 2024-06-19
  Filled 2024-03-09: qty 30, 10d supply, fill #0

## 2024-03-09 MED ORDER — LIDOCAINE VISCOUS HCL 2 % MT SOLN
15.0000 mL | Freq: Once | OROMUCOSAL | Status: AC
  Administered 2024-03-09: 15 mL via ORAL
  Filled 2024-03-09: qty 15

## 2024-03-09 MED ORDER — ACETAMINOPHEN 500 MG PO TABS
1000.0000 mg | ORAL_TABLET | Freq: Once | ORAL | Status: AC
  Administered 2024-03-09: 1000 mg via ORAL
  Filled 2024-03-09: qty 2

## 2024-03-09 NOTE — MAU Provider Note (Signed)
 Chief Complaint:  Back Pain and Hip Pain   HPI   None     Debra Allen is a 38 y.o. L2G4010 at [redacted]w[redacted]d who presents to maternity admissions reporting LUQ pain that has been present for several weeks, got worse on Wednesday. Tylenol  was initially helping, but not feeling any relief now. Pain always present since 0300, constant, wrapping around to back. New RUQ since Friday night. Denies other abdominal pain, denies NVD. Reports that movement makes it worse. Reports that a shower had mad the pain better but nothing is helping now. Reports normal BM yesterday.  She reports SOB yesterday.  Reports last tylenol  was at 0700 and she took 500mg .   Reports feelings of sadness and loneliness and this has been going on for 2 weeks.  Reports she is not sleeping well at night for the past several days.   Pregnancy Course: benign  Past Medical History:  Diagnosis Date   Anxiety    Headache(784.0)    Migraine   Urinary tract infection    OB History  Gravida Para Term Preterm AB Living  6 4 4  0 1 4  SAB IAB Ectopic Multiple Live Births  1 0 0 0 4    # Outcome Date GA Lbr Len/2nd Weight Sex Type Anes PTL Lv  6 Current           5 SAB 2021 [redacted]w[redacted]d         4 Term 09/30/14 [redacted]w[redacted]d 15:30 / 00:03 3541 g F Vag-Spont None  LIV  3 Term 07/13/13 [redacted]w[redacted]d 10:16 / 00:07 4345 g F Vag-Spont None  LIV  2 Term 03/31/11 [redacted]w[redacted]d  3629 g M Vag-Spont None  LIV  1 Term 03/23/06 [redacted]w[redacted]d  3771 g F Vag-Spont EPI  LIV   Past Surgical History:  Procedure Laterality Date   CHOLECYSTECTOMY N/A 09/24/2020   Procedure: LAPAROSCOPIC CHOLECYSTECTOMY;  Surgeon: Caralyn Chandler, MD;  Location: [facility];  Service: General;  Laterality: N/A;   NO PAST SURGERIES     Family History  Problem Relation Age of Onset   Diabetes Mother    Hypertension Maternal Grandmother    Diabetes Maternal Grandmother    Cancer Paternal Grandmother    Cancer Paternal Grandfather    Hypertension Father    Cancer Paternal Aunt        bladder  cancer    Asthma Other    Obesity Other    Sleep apnea Other    Heart disease Other    Other Neg Hx    Social History   Tobacco Use   Smoking status: Never    Passive exposure: Never   Smokeless tobacco: Never  Vaping Use   Vaping status: Never Used  Substance Use Topics   Alcohol use: No   Drug use: No   Allergies  Allergen Reactions   Keflex  [Cephalexin ] Itching and Rash   No medications prior to admission.    I have reviewed patient's Past Medical Hx, Surgical Hx, Family Hx, Social Hx, medications and allergies.   ROS  Pertinent items noted in HPI and remainder of comprehensive ROS otherwise negative.   PHYSICAL EXAM  Patient Vitals for the past 24 hrs:  BP Temp Temp src Pulse Resp SpO2 Height Weight  03/09/24 1738 110/74 98 F (36.7 C) Oral 74 16 -- -- --  03/09/24 1248 111/68 97.7 F (36.5 C) Oral 90 18 98 % 5\' 3"  (1.6 m) 89 kg    Physical Exam Vitals and nursing note  reviewed.  Constitutional:      General: She is not in acute distress.    Appearance: Normal appearance. She is not ill-appearing, toxic-appearing or diaphoretic.  Cardiovascular:     Rate and Rhythm: Normal rate.  Pulmonary:     Effort: Pulmonary effort is normal.  Abdominal:     General: Bowel sounds are normal.     Palpations: Abdomen is soft. There is no mass.     Tenderness: There is abdominal tenderness in the right upper quadrant and left upper quadrant. There is no rebound. Negative signs include Murphy's sign, Rovsing's sign and McBurney's sign.     Hernia: No hernia is present.    Skin:    General: Skin is warm.     Capillary Refill: Capillary refill takes less than 2 seconds.  Neurological:     General: No focal deficit present.     Mental Status: She is alert.  Psychiatric:        Mood and Affect: Mood normal.        Behavior: Behavior normal.        Thought Content: Thought content normal.        Judgment: Judgment normal.         FHR 152    Labs: Results for  orders placed or performed during the hospital encounter of 03/09/24 (from the past 24 hours)  Urinalysis, Routine w reflex microscopic -Urine, Clean Catch     Status: Abnormal   Collection Time: 03/09/24  1:44 PM  Result Value Ref Range   Color, Urine AMBER (A) YELLOW   APPearance CLOUDY (A) CLEAR   Specific Gravity, Urine 1.027 1.005 - 1.030   pH 5.0 5.0 - 8.0   Glucose, UA NEGATIVE NEGATIVE mg/dL   Hgb urine dipstick NEGATIVE NEGATIVE   Bilirubin Urine NEGATIVE NEGATIVE   Ketones, ur NEGATIVE NEGATIVE mg/dL   Protein, ur 30 (A) NEGATIVE mg/dL   Nitrite NEGATIVE NEGATIVE   Leukocytes,Ua LARGE (A) NEGATIVE   RBC / HPF 0-5 0 - 5 RBC/hpf   WBC, UA 21-50 0 - 5 WBC/hpf   Bacteria, UA RARE (A) NONE SEEN   Squamous Epithelial / HPF 21-50 0 - 5 /HPF   Mucus PRESENT    Hyaline Casts, UA PRESENT   CBC with Differential/Platelet     Status: Abnormal   Collection Time: 03/09/24  2:09 PM  Result Value Ref Range   WBC 7.8 4.0 - 10.5 K/uL   RBC 3.32 (L) 3.87 - 5.11 MIL/uL   Hemoglobin 10.0 (L) 12.0 - 15.0 g/dL   HCT 81.1 (L) 91.4 - 78.2 %   MCV 90.1 80.0 - 100.0 fL   MCH 30.1 26.0 - 34.0 pg   MCHC 33.4 30.0 - 36.0 g/dL   RDW 95.6 21.3 - 08.6 %   Platelets 192 150 - 400 K/uL   nRBC 0.0 0.0 - 0.2 %   Neutrophils Relative % 70 %   Neutro Abs 5.4 1.7 - 7.7 K/uL   Lymphocytes Relative 20 %   Lymphs Abs 1.6 0.7 - 4.0 K/uL   Monocytes Relative 7 %   Monocytes Absolute 0.5 0.1 - 1.0 K/uL   Eosinophils Relative 1 %   Eosinophils Absolute 0.1 0.0 - 0.5 K/uL   Basophils Relative 0 %   Basophils Absolute 0.0 0.0 - 0.1 K/uL   Immature Granulocytes 2 %   Abs Immature Granulocytes 0.17 (H) 0.00 - 0.07 K/uL  Comprehensive metabolic panel     Status: Abnormal  Collection Time: 03/09/24  2:09 PM  Result Value Ref Range   Sodium 136 135 - 145 mmol/L   Potassium 3.4 (L) 3.5 - 5.1 mmol/L   Chloride 106 98 - 111 mmol/L   CO2 22 22 - 32 mmol/L   Glucose, Bld 111 (H) 70 - 99 mg/dL   BUN 7 6 - 20  mg/dL   Creatinine, Ser 2.72 0.44 - 1.00 mg/dL   Calcium 8.8 (L) 8.9 - 10.3 mg/dL   Total Protein 6.1 (L) 6.5 - 8.1 g/dL   Albumin 3.1 (L) 3.5 - 5.0 g/dL   AST 18 15 - 41 U/L   ALT 25 0 - 44 U/L   Alkaline Phosphatase 52 38 - 126 U/L   Total Bilirubin 0.7 0.0 - 1.2 mg/dL   GFR, Estimated >53 >66 mL/min   Anion gap 8 5 - 15    Imaging:  No results found.  MDM & MAU COURSE  MDM: PE and review of EMR Trial of flexeril  for pain, with some relief. Proceed with trial of tylenol  1000mg  and GI cocktail.  CBC and CMP benign.  MAU Course: Orders Placed This Encounter  Procedures   Urinalysis, Routine w reflex microscopic -Urine, Clean Catch   CBC with Differential/Platelet   Comprehensive metabolic panel   Ambulatory referral to Integrated Behavioral Health   EKG 12-Lead   Discharge patient Discharge disposition: 01-Home or Self Care; Discharge patient date: 03/09/2024   Meds ordered this encounter  Medications   cyclobenzaprine  (FLEXERIL ) tablet 10 mg   acetaminophen  (TYLENOL ) tablet 1,000 mg   AND Linked Order Group    alum & mag hydroxide-simeth (MAALOX/MYLANTA) 200-200-20 MG/5ML suspension 30 mL    lidocaine  (XYLOCAINE ) 2 % viscous mouth solution 15 mL   hydrOXYzine  (ATARAX ) 25 MG tablet    Sig: Take 1 tablet (25 mg total) by mouth every 8 (eight) hours as needed for anxiety.    Dispense:  30 tablet    Refill:  0    Correct dosing    Supervising Provider:   Avie Boeck A [[id number]]   FLUoxetine (PROZAC) 10 MG tablet    Sig: Take 1 tablet (10 mg total) by mouth daily.    Dispense:  30 tablet    Refill:  1    Supervising Provider:   Abigail Abler [[id number]]   cyclobenzaprine  (FLEXERIL ) 5 MG tablet    Sig: Take 1 tablet (5 mg total) by mouth 3 (three) times daily as needed.    Dispense:  20 tablet    Refill:  0    Supervising Provider:   Avie Boeck A [[id number]]    ASSESSMENT   1. Abdominal pain affecting pregnancy   2. Musculoskeletal pain   3. Movement of  fetus present during pregnancy in second trimester   4. Presence of fetal heart sounds in second trimester   5. Psychophysiological insomnia   6. Depression affecting pregnancy in second trimester, antepartum   7. Anxiety    PE and review of EMR Trial of flexeril  for pain, with some relief. Proceed with trial of tylenol  1000mg  and GI cocktail.  CBC and CMP benign. PLAN  Discharge home in stable condition. Follow up as scheduled with OB provider.  Safe medication in pregnancy list provided in AVS.  Recommend 1000 mg of Tylenol  PRN q 6h for pain.  Consider antiacid mediations PRN. Short-course flexeril  sent for musculoskeletal pain. Flexeril  5mg  TID sent to patients preferred pharmacy (20 count.). [facility] consults sent for  feelings of anxiety,  depression Prozac started for feelings of depression after RBA with patient. 10mg  daily. Will need follow up in 4 weeks to ensure effecive vs increasing dose, can be with PN care provider.  Atarax  sent for anxiety PRN. 25mg  PO.    Allergies as of 03/09/2024       Reactions   Keflex  [cephalexin ] Itching, Rash        Medication List     TAKE these medications    acetaminophen  500 MG tablet Commonly known as: TYLENOL  Take 500 mg by mouth every 6 (six) hours as needed for mild pain (pain score 1-3) or moderate pain (pain score 4-6).   acetaminophen -caffeine  500-65 MG Tabs per tablet Commonly known as: EXCEDRIN TENSION HEADACHE Take 2 tablets by mouth every 8 (eight) hours as needed.   aspirin  EC 81 MG tablet Take 1 tablet (81 mg total) by mouth daily. Swallow whole.   cetirizine  10 MG tablet Commonly known as: ZYRTEC  Take 1 tablet (10 mg total) by mouth daily.   cyclobenzaprine  5 MG tablet Commonly known as: FLEXERIL  Take 1 tablet (5 mg total) by mouth 3 (three) times daily as needed.   Doxylamine -Pyridoxine  10-10 MG Tbec Take 2 tabs at bedtime. If needed, add another tab in the morning. If needed, add another tab in the afternoon, up  to 4 tabs/day.   famotidine  40 MG tablet Commonly known as: Pepcid  Take 1 tablet (40 mg total) by mouth once daily.   FLUoxetine 10 MG tablet Commonly known as: PROZAC Take 1 tablet (10 mg total) by mouth daily.   fluticasone  50 MCG/ACT nasal spray Commonly known as: FLONASE  Place 2 sprays into both nostrils daily.   hydrOXYzine  25 MG tablet Commonly known as: ATARAX  Take 1 tablet (25 mg total) by mouth every 8 (eight) hours as needed for anxiety. What changed:  when to take this reasons to take this   ondansetron  4 MG disintegrating tablet Commonly known as: ZOFRAN -ODT Dissolve 1 tablet (4 mg total) by mouth every 6 (six) hours as needed for nausea.   prenatal multivitamin Tabs tablet Take 1 tablet by mouth daily at 12 noon.   Vitamin D  (Ergocalciferol ) 1.25 MG (50000 UNIT) Caps capsule Commonly known as: DRISDOL  Take 1 capsule (50,000 Units total) by mouth every 7 (seven) days.        Raford Bunk, MSN, CNM 03/09/2024 5:45 PM  Certified Nurse Midwife, [facility]

## 2024-03-09 NOTE — Discharge Instructions (Signed)

## 2024-03-09 NOTE — MAU Note (Signed)
.  Debra Allen is a 38 y.o. at [redacted]w[redacted]d here in MAU reporting: left lower back and hip pain since two days ago. Patient typically wears a belly band for support and tried 500 mg of Tylenol  this AM at 0800 with no relief. No vaginal bleeding or LOF. No other complaints.    Onset of complaint: 03/07/24 Pain score: 8/10 Vitals:   03/09/24 1248  BP: 111/68  Pulse: 90  Resp: 18  Temp: 97.7 F (36.5 C)  SpO2: 98%     FHT: 152  Lab orders placed from triage: UA

## 2024-03-10 ENCOUNTER — Other Ambulatory Visit (HOSPITAL_COMMUNITY): Payer: Self-pay

## 2024-03-10 ENCOUNTER — Other Ambulatory Visit: Payer: Self-pay

## 2024-03-10 ENCOUNTER — Ambulatory Visit: Payer: Self-pay

## 2024-03-10 NOTE — Progress Notes (Deleted)
    SUBJECTIVE:   CHIEF COMPLAINT / HPI:   Discussed the use of AI scribe software for clinical note transcription with the patient, who gave verbal consent to proceed.  Seen yesterday 4/27 in MAU for LUQ pain and feelings of sadness. Had benign exam, CBC, CMP + FHT. Was trialed on flexeril  for pain and tylenol  1000 mg. Was given [facility] consults for anxiety/depression and started on Prozac 10 mg daily and atarax  25 mg PRN.  History of Present Illness    PERTINENT  PMH / PSH: ***  OBJECTIVE:   LMP 10/21/2023 (Approximate)   ***  ASSESSMENT/PLAN:   Assessment & Plan   Assessment and Plan Assessment & Plan     Genora Kidd, MD [facility]

## 2024-03-17 NOTE — Telephone Encounter (Signed)
 UPDATE 03/17/24  Notes: Reached out to client via telephone call to finish uncompleted section of Guided Conversation. Unable to reach - left voicemail req call back or text with another date/time that works so CN can support family with any needs not discussed.  *8th attempt - Community Navigation guidelines is to reach out to client 8x for contact. Marking account inactive due to repeated failed attempts to reach*  Lashawndra Lampkins [facility] [employer] [place] Direct Dial: [phone]  UPDATE 03/13/24   Notes: Reached out to client via telephone call to finish uncompleted section of Guided Conversation. Unable to reach - sent text message checking in.   Kateryna Grantham Carolynne Citron [facility] [employer] [place] Direct Dial: [phone]  UPDATE 03/10/24  Notes: Reached out to client via telephone call to finish uncompleted section of Guided Conversation. Unable to reach - left voicemail req call back or text with another date/time that works so CN can support family with any needs not discussed.   Tyan Dy Carolynne Citron [facility] [employer] [place] Direct Dial: [phone]  UPDATE 02/28/24  Notes: Reached out to client via telephone call to finish uncompleted section of Guided Conversation. Unable to reach - left voicemail req call back or text with another date/time that works so CN can support family with any needs not discussed.   Zoey Bidwell Carolynne Citron Community Navigator [facility] [employer] [place] Direct Dial: [phone]

## 2024-03-20 ENCOUNTER — Other Ambulatory Visit (HOSPITAL_COMMUNITY): Payer: Self-pay

## 2024-03-24 ENCOUNTER — Encounter (HOSPITAL_COMMUNITY): Payer: Self-pay | Admitting: Family Medicine

## 2024-03-24 ENCOUNTER — Inpatient Hospital Stay (HOSPITAL_COMMUNITY): Payer: Self-pay

## 2024-03-24 ENCOUNTER — Ambulatory Visit: Payer: Self-pay | Admitting: Student

## 2024-03-24 ENCOUNTER — Inpatient Hospital Stay (HOSPITAL_COMMUNITY)
Admission: AD | Admit: 2024-03-24 | Discharge: 2024-03-24 | Disposition: A | Discharge status: Home / Self Care | Payer: Self-pay | Attending: Family Medicine | Admitting: Family Medicine

## 2024-03-24 ENCOUNTER — Inpatient Hospital Stay (HOSPITAL_BASED_OUTPATIENT_CLINIC_OR_DEPARTMENT_OTHER): Payer: Self-pay

## 2024-03-24 VITALS — BP 100/58 | HR 57 | Wt 197.0 lb

## 2024-03-24 DIAGNOSIS — Z3A22 22 weeks gestation of pregnancy: Secondary | ICD-10-CM

## 2024-03-24 DIAGNOSIS — O26892 Other specified pregnancy related conditions, second trimester: Secondary | ICD-10-CM

## 2024-03-24 DIAGNOSIS — R102 Pelvic and perineal pain: Secondary | ICD-10-CM

## 2024-03-24 DIAGNOSIS — Z3492 Encounter for supervision of normal pregnancy, unspecified, second trimester: Secondary | ICD-10-CM

## 2024-03-24 DIAGNOSIS — N898 Other specified noninflammatory disorders of vagina: Secondary | ICD-10-CM

## 2024-03-24 DIAGNOSIS — Z3480 Encounter for supervision of other normal pregnancy, unspecified trimester: Secondary | ICD-10-CM

## 2024-03-24 DIAGNOSIS — R1030 Lower abdominal pain, unspecified: Secondary | ICD-10-CM | POA: Insufficient documentation

## 2024-03-24 DIAGNOSIS — M545 Low back pain, unspecified: Secondary | ICD-10-CM

## 2024-03-24 LAB — COMPREHENSIVE METABOLIC PANEL WITH GFR
ALT: 26 U/L (ref 0–44)
AST: 23 U/L (ref 15–41)
Albumin: 3.1 g/dL — ABNORMAL LOW (ref 3.5–5.0)
Alkaline Phosphatase: 58 U/L (ref 38–126)
Anion gap: 8 (ref 5–15)
BUN: 6 mg/dL (ref 6–20)
CO2: 22 mmol/L (ref 22–32)
Calcium: 9.4 mg/dL (ref 8.9–10.3)
Chloride: 107 mmol/L (ref 98–111)
Creatinine, Ser: 0.42 mg/dL — ABNORMAL LOW (ref 0.44–1.00)
GFR, Estimated: >60 mL/min (ref >60)
Glucose, Bld: 95 mg/dL (ref 70–99)
Potassium: 3.8 mmol/L (ref 3.5–5.1)
Sodium: 137 mmol/L (ref 135–145)
Total Bilirubin: 0.7 mg/dL (ref 0.0–1.2)
Total Protein: 6 g/dL — ABNORMAL LOW (ref 6.5–8.1)

## 2024-03-24 LAB — CBC
HCT: 30.8 % — ABNORMAL LOW (ref 36.0–46.0)
Hemoglobin: 10.5 g/dL — ABNORMAL LOW (ref 12.0–15.0)
MCH: 30.3 pg (ref 26.0–34.0)
MCHC: 34.1 g/dL (ref 30.0–36.0)
MCV: 88.8 fL (ref 80.0–100.0)
Platelets: 200 10*3/uL (ref 150–400)
RBC: 3.47 MIL/uL — ABNORMAL LOW (ref 3.87–5.11)
RDW: 13.7 % (ref 11.5–15.5)
WBC: 7.5 10*3/uL (ref 4.0–10.5)
nRBC: 0 % (ref 0.0–0.2)

## 2024-03-24 LAB — POCT URINALYSIS DIP (MANUAL ENTRY)
Bilirubin, UA: NEGATIVE
Glucose, UA: NEGATIVE mg/dL
Nitrite, UA: NEGATIVE
Protein Ur, POC: NEGATIVE mg/dL
Spec Grav, UA: 1.025 (ref 1.010–1.025)
Urobilinogen, UA: 0.2 U/dL
pH, UA: 6 (ref 5.0–8.0)

## 2024-03-24 LAB — POCT UA - MICROSCOPIC ONLY: Epithelial cells, urine per micros: >20

## 2024-03-24 LAB — POCT FERN TEST: POCT Fern Test: NEGATIVE

## 2024-03-24 LAB — RUPTURE OF MEMBRANE (ROM)PLUS: Rom Plus: NEGATIVE

## 2024-03-24 NOTE — Patient Instructions (Signed)
 Please go to the Maternity Admissions Unit in the [facility] at [facility]. You will use hospital Entrance C.    Our clinic number is [phone].   Dr Drue Gerald

## 2024-03-24 NOTE — Progress Notes (Signed)
    SUBJECTIVE:   CHIEF COMPLAINT / HPI:   Cheslea Breyer is a 38 y.o. female presenting for intermittent cramping a leaking fluid.  She reports she has had intermittent episodes of her stomach tightening which are painful and do not relieved with walking or changing position.  She reports they have not happen regularly or in any certain pattern.  She also reports that she is started to have increased fluid in her underwear since Friday.  She reports that she has had to wear 3 panty liners during the day but they are soaked wet.  She has been able to feel good movement.  Denies bloody fluid.  PERTINENT  PMH / PSH: reviewed and updated.  OBJECTIVE:   BP (!) 100/58   Pulse (!) 57   Wt 197 lb (89.4 kg)   LMP 10/21/2023 (Approximate)   BMI 34.90 kg/m   Well-appearing, no acute distress Cardio: Regular rate, regular rhythm, no murmurs on exam.  Fetal heart tones: Appropriate, 134 Measuring appropriately  POCUS: Bedside ultrasound to view fetus.  Good tone and movement appreciated.  Also noted to have full bladder and stomach.  ASSESSMENT/PLAN:   Assessment & Plan Vaginal discharge during pregnancy in second trimester Presentation is concerning for possible leaking of amniotic fluid.  Recommended patient go to the MAU for evaluation to determine whether or not amniotic fluid is present.  Also concerning with intermittent [place]ve for preterm labor. Bilateral low back pain, unspecified chronicity, unspecified whether sciatica present UA obtained and showing leukocytes, reassuringly patient is not showing signs of infection without dysuria, back pain, fever.  Will not treat with antibiotics at this time.  Will also send urine for culture. Supervision of other normal pregnancy, antepartum Patient is due for anatomy scan.  Will send order today for [place] ultrasounds to obtain anatomy scan.  Will call patient with appointment date.     Clem Currier, DO [place]  [facility]

## 2024-03-24 NOTE — MAU Provider Note (Signed)
 History   [name] is a 38 y.o. year old G34P4014 female at [redacted]w[redacted]d weeks gestation who presents to MAU reporting vaginal leaking and abdominal pain. She reports that she began having sharp lower abdominal and right sided pain 8/10 with tightening last thursday. The pain initially improved with hydration and rest, but she reports that its no longer helping. She denies vaginal bleeding, dysuria, constipation, or N/V. She also noticed watery vaginal discharge in her underwear on Friday. She was seen in the clinic today where they did a limited BSUS and were concerned about amniotic fluid level. They sent her to MAU to rule out PPROM. She reports very light fetal movement, and occasional contractions.    CSN: [id number]  Arrival date and time: 03/24/24 1527   Event Date/Time   First Provider Initiated Contact with Patient 03/24/24 1700      Chief Complaint  Patient presents with   Abdominal Pain   Rupture of Membranes     OB History     Gravida  6   Para  4   Term  4   Preterm  0   AB  1   Living  4      SAB  1   IAB  0   Ectopic  0   Multiple  0   Live Births  4           Past Medical History:  Diagnosis Date   Anxiety    Headache(784.0)    Migraine   Urinary tract infection     Past Surgical History:  Procedure Laterality Date   CHOLECYSTECTOMY N/A 09/24/2020   Procedure: LAPAROSCOPIC CHOLECYSTECTOMY;  Surgeon: Caralyn Chandler, MD;  Location: [facility] OR;  Service: General;  Laterality: N/A;   NO PAST SURGERIES      Family History  Problem Relation Age of Onset   Diabetes Mother    Hypertension Father    Cancer Paternal Aunt        bladder cancer    Hypertension Maternal Grandmother    Diabetes Maternal Grandmother    Cancer Paternal Grandmother    Cancer Paternal Grandfather    Asthma Other    Obesity Other    Sleep apnea Other    Heart disease Other    Other Neg Hx     Social History   Tobacco Use   Smoking status: Never     Passive exposure: Never   Smokeless tobacco: Never  Vaping Use   Vaping status: Never Used  Substance Use Topics   Alcohol use: No   Drug use: No    Allergies:  Allergies  Allergen Reactions   Keflex  [Cephalexin ] Itching and Rash    No medications prior to admission.    Pertinent items noted in HPI and remainder of comprehensive ROS otherwise negative.   Physical Exam   Blood pressure 108/66, pulse 85, temperature 97.9 F (36.6 C), temperature source Oral, resp. rate 17, height 5\' 3"  (1.6 m), weight 89 kg, last menstrual period 10/21/2023, SpO2 100%.  Physical Exam Constitutional:      Appearance: She is well-developed.  Cardiovascular:     Rate and Rhythm: Normal rate.  Pulmonary:     Effort: Pulmonary effort is normal.  Abdominal:     Palpations: Abdomen is soft.     Tenderness: There is abdominal tenderness in the right lower quadrant and suprapubic area. There is right CVA tenderness.  Genitourinary:    Vagina: Vaginal discharge present.  Neurological:  Mental Status: She is alert.  Psychiatric:        Mood and Affect: Mood normal.   Fetal heart tones obtained in triage.  MAU Course  Procedures Results for orders placed or performed during the hospital encounter of 03/24/24 (from the past 24 hours)  POCT fern test     Status: None   Collection Time: 03/24/24  5:29 PM  Result Value Ref Range   POCT Fern Test Negative = intact amniotic membranes   CBC     Status: Abnormal   Collection Time: 03/24/24  5:45 PM  Result Value Ref Range   WBC 7.5 4.0 - 10.5 K/uL   RBC 3.47 (L) 3.87 - 5.11 MIL/uL   Hemoglobin 10.5 (L) 12.0 - 15.0 g/dL   HCT 16.1 (L) 09.6 - 04.5 %   MCV 88.8 80.0 - 100.0 fL   MCH 30.3 26.0 - 34.0 pg   MCHC 34.1 30.0 - 36.0 g/dL   RDW 40.9 81.1 - 91.4 %   Platelets 200 150 - 400 K/uL   nRBC 0.0 0.0 - 0.2 %  Comprehensive metabolic panel     Status: Abnormal   Collection Time: 03/24/24  5:45 PM  Result Value Ref Range   Sodium 137 135  - 145 mmol/L   Potassium 3.8 3.5 - 5.1 mmol/L   Chloride 107 98 - 111 mmol/L   CO2 22 22 - 32 mmol/L   Glucose, Bld 95 70 - 99 mg/dL   BUN 6 6 - 20 mg/dL   Creatinine, Ser 7.82 (L) 0.44 - 1.00 mg/dL   Calcium 9.4 8.9 - 95.6 mg/dL   Total Protein 6.0 (L) 6.5 - 8.1 g/dL   Albumin 3.1 (L) 3.5 - 5.0 g/dL   AST 23 15 - 41 U/L   ALT 26 0 - 44 U/L   Alkaline Phosphatase 58 38 - 126 U/L   Total Bilirubin 0.7 0.0 - 1.2 mg/dL   GFR, Estimated >21 >30 mL/min   Anion gap 8 5 - 15  Rupture of Membrane (ROM) Plus     Status: None   Collection Time: 03/24/24  5:47 PM  Result Value Ref Range   Rom Plus NEGATIVE    Orders Placed This Encounter  Procedures   Culture, OB Urine   US  RENAL   US  MFM OB LIMITED   CBC   Comprehensive metabolic panel   Rupture of Membrane (ROM) Plus   POCT fern test   Discharge patient Discharge disposition: 01-Home or Self Care; Discharge patient date: 03/24/2024   US  RENAL Result Date: 03/24/2024 CLINICAL DATA:  Twenty-two weeks pregnant with right flank pain. EXAM: RENAL / URINARY TRACT ULTRASOUND COMPLETE COMPARISON:  CT without contrast 06/09/2020. FINDINGS: Right Kidney: Renal measurements: 14.0 x 4.9 x 5.5 cm = volume: 198.9 mL. Echogenicity within normal limits. No mass, stones or hydronephrosis visualized. Left Kidney: Renal measurements: 14.3 x 5.0 x 5.5 cm = volume: 204.8 6 mL. Echogenicity within normal limits. No mass, stones or hydronephrosis visualized. Bladder: Appears normal for degree of bladder distention. No evaluations made for urine jets. Other: None. IMPRESSION: Negative renal ultrasound.  No focal bladder thickening. Electronically Signed   By: Denman Fischer M.D.   On: 03/24/2024 20:07    MDM Renal US  normal. WBC WNL. Creatinine WNL. Low suspicion for UTI and pyleonephritis.  AFI normal, largest pocket 5.9. ROM plus and Fern negative. Low suspicion for PPROM.   Plan for discharge.  Assessment and Plan     ICD-10-CM  1. Vaginal discharge   N89.8     2. [redacted] weeks gestation of pregnancy  Z3A.22     3. Intact amniotic membranes during pregnancy in second trimester  Z34.92       Patient discharged to home in stable condition. Patient may return to MAU for any pregnancy related concerns including vaginal bleeding, large gushing of fluid, DFM, or increased abdominal pain.   Wilford Hanks 03/24/2024, 8:11 PM

## 2024-03-24 NOTE — Assessment & Plan Note (Addendum)
 Patient is due for anatomy scan.  Will send order today for [place] ultrasounds to obtain anatomy scan.  Will call patient with appointment date.

## 2024-03-24 NOTE — MAU Note (Signed)
 Debra Allen is a 38 y.o. at [redacted]w[redacted]d here in MAU reporting: has appt today, sent for further eval. since Friday, had a lot of cramping all over stomach and a lot of pressure. Has been noticing underwear has been wet. Dr did a urine test, was concerned about water around the baby.   Onset of complaint: since Friday Pain score: moderate Vitals:   03/24/24 1628  BP: 107/66  Pulse: 87  Resp: 17  Temp: 97.9 F (36.6 C)  SpO2: 100%     FHT:150 Lab orders placed from triage:

## 2024-03-26 LAB — URINE CULTURE

## 2024-03-27 NOTE — BH Specialist Note (Signed)
 Pt did not arrive to video visit and did not answer the phone; Left HIPPA-compliant message to call back Carolyn Cisco from [employer] for [employer] at [facility] at  [phone] [facility] office).  ?; left MyChart message for patient.  ? ?

## 2024-04-08 ENCOUNTER — Ambulatory Visit: Payer: Self-pay | Admitting: Clinical

## 2024-04-15 ENCOUNTER — Ambulatory Visit: Payer: Self-pay | Attending: Nurse Practitioner

## 2024-04-23 ENCOUNTER — Other Ambulatory Visit: Payer: Self-pay

## 2024-04-23 ENCOUNTER — Ambulatory Visit (INDEPENDENT_AMBULATORY_CARE_PROVIDER_SITE_OTHER): Payer: Self-pay | Admitting: Student

## 2024-04-23 VITALS — BP 109/66 | HR 82 | Wt 198.4 lb

## 2024-04-23 DIAGNOSIS — Z3A26 26 weeks gestation of pregnancy: Secondary | ICD-10-CM

## 2024-04-23 LAB — POCT 1 HR PRENATAL GLUCOSE: Glucose 1 Hr Prenatal, POC: 138 mg/dL

## 2024-04-23 NOTE — Patient Instructions (Addendum)
 Pleasure to meet you today  Today we are ordering labs to check your blood count, HIV, and syphilis.  These are usually routine labs at this age. Please go to the [facility] on [address], [address], [place].  I have also ordered ultrasound monitor baby's weight and anatomy.  Please call the number below for financial assistance. Carlos with [facility] Financial assistance is available by phone at [phone]  Also obtaining labs to check your blood glucose test.  For the numbness you are having in your hands, feet.  If you start having weakness in your lower extremities or difficulty with walking or moving your hands please go to the ED to be evaluated.

## 2024-04-23 NOTE — Progress Notes (Signed)
  [facility] Prenatal Visit  Debra Allen is a 38 y.o. [id number] at [redacted]w[redacted]d here for routine follow up. She is dated by early ultrasound.  She reports no bleeding, no contractions, no cramping, and no leaking. She reports fetal movement. Denies vaginal bleeding, loss of fluid, or contractions.  See flow sheet for details.  Today patient reports of intermittent numbness and a hands bilaterally (worse on the right hand), left on the breast and bilateral lower extremities.  Extremity numbness of the feet improves with exercise, movements and soaking her feet in water with salt.  And numbness worse mostly after waking up from sleep.  Under breast numbness was more persistent on Monday and Tuesday.  Vitals:   04/23/24 1015  BP: 109/66  Pulse: 82  Fetal heart: 158 bpm, fundal height 25cm  General: Alert, well appearing, NAD HEENT: Atraumatic, MMM, No sclera icterus CV: RRR, no murmurs, normal S1/S2 Pulm: CTAB, good WOB on RA, no crackles or wheezing Abd: Soft, no distension, no tenderness Neuro: ANO x 3, no focal deficits Ext: No BLE edema  A/P: Pregnancy at [redacted]w[redacted]d.  Doing well.   Dating reviewed, dating tab is correct Fetal heart tones Appropriate Fundal height within expected range.  Influenza vaccine not administered as not influenza season.   COVID vaccination discussion deferred  Screening for gestational diabetes completed today and appropriate. .  Had a normal early 1 hour glucose test.   Pregnancy education completed including: fetal growth, breastfeeding, contraception, and expected weight gain in pregnancy.  Needs to be scheduled for faculty OB clinic. Preterm labor, bleeding, and pain precautions given.    2. Pregnancy issues include the following and were addressed as appropriate today:   Ordered MFM detailed anatomy ultrasound  Obtained routine lab including 1 hour Glucola test, CBC, CMP, HIV, RPR  Problem list and pregnancy box updated: Yes.   3.   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  Denies any extremity weakness and neurological exam generally unremarkable.  Suspect possible carpal tunnel and pregnancy related neuropathy. - Discussed return precautions - Obtain B12, HIV, RPR  Follow up 4 weeks.

## 2024-04-24 ENCOUNTER — Other Ambulatory Visit (INDEPENDENT_AMBULATORY_CARE_PROVIDER_SITE_OTHER): Payer: Self-pay

## 2024-04-24 DIAGNOSIS — Z3A26 26 weeks gestation of pregnancy: Secondary | ICD-10-CM

## 2024-04-24 LAB — CBC
Hematocrit: 32.6 % — ABNORMAL LOW (ref 34.0–46.6)
Hemoglobin: 10.7 g/dL — ABNORMAL LOW (ref 11.1–15.9)
MCH: 29.5 pg (ref 26.6–33.0)
MCHC: 32.8 g/dL (ref 31.5–35.7)
MCV: 90 fL (ref 79–97)
Platelets: 209 10*3/uL (ref 150–450)
RBC: 3.63 x10E6/uL — ABNORMAL LOW (ref 3.77–5.28)
RDW: 13.7 % (ref 11.7–15.4)
WBC: 7.2 10*3/uL (ref 3.4–10.8)

## 2024-04-24 LAB — HIV ANTIBODY (ROUTINE TESTING W REFLEX): HIV Screen 4th Generation wRfx: NONREACTIVE

## 2024-04-24 LAB — VITAMIN B12: Vitamin B-12: 272 pg/mL (ref 232–1245)

## 2024-04-24 LAB — POCT CBG (FASTING - GLUCOSE)-MANUAL ENTRY: Glucose Fasting, POC: 104 mg/dL — AB (ref 70–99)

## 2024-04-24 LAB — RPR: RPR Ser Ql: NONREACTIVE

## 2024-04-25 LAB — GESTATIONAL GLUCOSE TOLERANCE
Glucose, Fasting: 93 mg/dL (ref 70–94)
Glucose, GTT - 1 Hour: 129 mg/dL (ref 70–179)
Glucose, GTT - 2 Hour: 124 mg/dL (ref 70–154)
Glucose, GTT - 3 Hour: 128 mg/dL (ref 70–139)

## 2024-05-19 NOTE — Progress Notes (Unsigned)
  [facility] Prenatal Visit  Debra Allen is a 38 y.o. [id number] at [redacted]w[redacted]d here for routine follow up. She is dated by early ultrasound.  She reports {symptoms; pregnancy related:14538}. She reports fetal movement. She denies vaginal bleeding, contractions, or loss of fluid. See flow sheet for details.  There were no vitals filed for this visit.    A/P: Pregnancy at [redacted]w[redacted]d.  Doing well.   Routine prenatal care:  Dating reviewed, dating tab is {correct:23336::correct} Fetal heart tones {appropriate:23337} Fundal height {fundal height:23342::within expected range. } Infant feeding choice: {Breasfeeding:23347::Breastfeeding} Contraception choice: {postpartum contraception:23348} Infant circumcision desired {Response; yes/no/na:63}  The patient {CWHCS:23409::does not have a history of Cesarean delivery and no referral to [facility] is indicated} Influenza vaccine not administered as not influenza season.   Tdap {was/was not:19854::was} given today. 1 hour glucola, CBC, RPR, and HIV {WERE / WERE NOT:19253::were} obtained today.    Rh status was reviewed and patient does not need Rhogam.  Rhogam was not given today.  Pregnancy medical home and PHQ-9 forms {WERE / WERE NOT:19253} done today and reviewed.   Childbirth and education classes {WERE / WERE WNU:80746} offered. Pregnancy education regarding benefits of breastfeeding, contraception, fetal growth, expected weight gain, and safe infant sleep were discussed.  Preterm labor and fetal movement precautions reviewed.  2. Pregnancy issues include the following and were addressed as appropriate today:   Anemia pregnancy: Hemoglobin 10.7 at last visit.  Problem list and pregnancy box updated: {yes/no:20286::Yes}.   Patient scheduled in [facility] during third trimester on ***.  Follow up 2 weeks.

## 2024-05-20 ENCOUNTER — Ambulatory Visit: Payer: Self-pay | Admitting: Family Medicine

## 2024-05-20 VITALS — BP 98/69 | HR 88 | Wt 200.6 lb

## 2024-05-20 DIAGNOSIS — O99013 Anemia complicating pregnancy, third trimester: Secondary | ICD-10-CM

## 2024-05-20 DIAGNOSIS — Z3480 Encounter for supervision of other normal pregnancy, unspecified trimester: Secondary | ICD-10-CM

## 2024-05-20 NOTE — Patient Instructions (Addendum)
 Programe una visita de seguimiento en 2 semanas.  Please start taking oral iron  supplementation every other day as you do have some anemia.  Please stay well-hydrated and drink lots of fluids and try to have regular mealtimes throughout the day as this can help with your [facility] contractions.  As we discussed just because this is your fifth baby does not mean you will deliver early it just means your labor might be shorter in duration.  If you have any concerns about leaking fluid, vaginal bleeding or regular contractions please seek out care immediately.  You can use a belly band for your back and lower abdomen pain as this may help support the weight of the baby especially as you progress in the pregnancy.  You can order this online.  Prenatal Classes Go to [email or web address]  [place] Si est en el programa Adopt a Mom y [address] vacunas, [address] se realizan en el [place] en [address] [place]. El nmero para llamar para programar una cita es [phone].  Precauciones de devolucin relacionadas con el embarazo  Los siguientes son signos/sntomas que son anormales en el embarazo y pueden requerir una evaluacin adicional por parte de un mdico: Vaya a MAU en [facility] en [place] si: Tiene calambres/contracciones que no desaparecen con agua potable, especialmente si duran de 30 segundos a 1,5 minutos, aparecen y desaparecen cada 5-10 minutos durante una hora o ms, o si son cada vez ms fuertes y no puede caminar o Physiological scientist. Tu agua se rompe. A veces es un gran chorro de lquido, a veces es solo un goteo que sigue mojando tu ropa interior o corriendo por tus piernas. Tiene sangrado vaginal. No siente que su beb se mueva normalmente. Si no lo hace, busque algo para comer y beber (algo fro o algo con azcar [employer] de man o slovenia) y acustese y  concntrese en sentir cmo se mueve su beb. Si su beb todava no se mueve con normalidad, debe ir a MAU. Debe sentir que su beb se mueve 6 veces en una hora o 10 veces en dos horas. Tiene un dolor de cabeza persistente que no desaparece con 1 g de Tylenol , cambios en la visin, dolor en el pecho, dificultad para respirar, dolor intenso en la parte superior derecha del abdomen, empeoramiento de la hinchazn de las piernas; todos estos pueden ser signos de presin arterial alta en el embarazo y necesita para ser evaluado por un proveedor inmediatamente  [place] son preocupantes durante el Psychiatrist y, si tiene alguno de estos, le recomiendo que llame a su PCP y se presente a Furniture conservator/restorer de Admisiones de Maternidad (mapa a continuacin) para una evaluacin adicional.  Para cualquier emergencia relacionada con el embarazo, dirjase a la [place] de Admisiones de Maternidad en [address] de [place] y [facility]. Usar la Entrada C del hospital.

## 2024-06-03 ENCOUNTER — Encounter: Payer: Self-pay | Admitting: Family Medicine

## 2024-06-03 ENCOUNTER — Other Ambulatory Visit: Payer: Self-pay | Admitting: *Deleted

## 2024-06-03 ENCOUNTER — Ambulatory Visit: Payer: Self-pay | Attending: Family Medicine

## 2024-06-03 ENCOUNTER — Ambulatory Visit (HOSPITAL_BASED_OUTPATIENT_CLINIC_OR_DEPARTMENT_OTHER): Payer: Self-pay | Admitting: Maternal & Fetal Medicine

## 2024-06-03 ENCOUNTER — Ambulatory Visit: Payer: Self-pay

## 2024-06-03 ENCOUNTER — Other Ambulatory Visit: Payer: Self-pay | Admitting: Family Medicine

## 2024-06-03 VITALS — BP 97/63

## 2024-06-03 DIAGNOSIS — Z3A32 32 weeks gestation of pregnancy: Secondary | ICD-10-CM

## 2024-06-03 DIAGNOSIS — O358XX0 Maternal care for other (suspected) fetal abnormality and damage, not applicable or unspecified: Secondary | ICD-10-CM

## 2024-06-03 DIAGNOSIS — Z3A26 26 weeks gestation of pregnancy: Secondary | ICD-10-CM

## 2024-06-03 DIAGNOSIS — O368130 Decreased fetal movements, third trimester, not applicable or unspecified: Secondary | ICD-10-CM | POA: Insufficient documentation

## 2024-06-03 DIAGNOSIS — O09523 Supervision of elderly multigravida, third trimester: Secondary | ICD-10-CM

## 2024-06-03 DIAGNOSIS — O09522 Supervision of elderly multigravida, second trimester: Secondary | ICD-10-CM

## 2024-06-03 DIAGNOSIS — O99213 Obesity complicating pregnancy, third trimester: Secondary | ICD-10-CM

## 2024-06-03 DIAGNOSIS — O3660X0 Maternal care for excessive fetal growth, unspecified trimester, not applicable or unspecified: Secondary | ICD-10-CM | POA: Insufficient documentation

## 2024-06-03 DIAGNOSIS — Z363 Encounter for antenatal screening for malformations: Secondary | ICD-10-CM | POA: Insufficient documentation

## 2024-06-03 DIAGNOSIS — O368131 Decreased fetal movements, third trimester, fetus 1: Secondary | ICD-10-CM

## 2024-06-03 DIAGNOSIS — O3663X0 Maternal care for excessive fetal growth, third trimester, not applicable or unspecified: Secondary | ICD-10-CM | POA: Insufficient documentation

## 2024-06-03 DIAGNOSIS — O368190 Decreased fetal movements, unspecified trimester, not applicable or unspecified: Secondary | ICD-10-CM | POA: Insufficient documentation

## 2024-06-03 NOTE — Procedures (Signed)
 Debra Allen 1986-02-21 [redacted]w[redacted]d  Fetus A Non-Stress Test Interpretation for 06/03/24  Indication: Unsatisfactory BPP  Fetal Heart Rate A Mode: External Baseline Rate (A): 135 bpm Variability: Moderate Accelerations: 15 x 15 Decelerations: None Multiple birth?: No  Uterine Activity Mode: Toco, Palpation Contraction Frequency (min): none noted Resting Tone Palpated: Relaxed  Interpretation (Fetal Testing) Nonstress Test Interpretation: Reactive Comments: Reviewed with Dr. William

## 2024-06-03 NOTE — Progress Notes (Signed)
 Patient information  Patient Name: Debra Allen  Patient MRN:   [id number]  Referring practice: MFM Referring Provider: [facility]  Problem List   Patient Active Problem List   Diagnosis Date Noted   LGA (large for gestational age) fetus affecting mother, antepartum 06/03/2024   Decreased fetal movement affecting management of mother, antepartum 06/03/2024   Supervision of other normal pregnancy, antepartum 02/18/2024   Anemia 12/31/2014   Mildly obese 06/10/2014   Maternal Fetal Medicine Consult JAYLANI MCGUINN is a 38 y.o. [id number] at [redacted]w[redacted]d here for ultrasound and consultation. She had Low risk aneuploidy screening of a female fetus. Carrier screening was N/A. Maternal serum AFP /na. She has no acute concerns.   The patient was here for an anatomy scan and reported some decreased fetal movement.  She reports that her fetus is still moving normally throughout the evening but has noticed some recently morning time.  A biophysical profile was a 6 out of 8, -2 points for inconsistent practice breathing.  A nonstress test was performed showing a reactive tracing.   The patient is also advanced maternal age which but is not yet over age 36 and therefore no additional ultrasounds are recommended for this condition alone.  She also had low risk aneuploidy screening  The estimated fetal weight on today's ultrasound is consistent with large for gestational age (LGA). I discussed the diagnosis, management, and clinical significance of an LGA fetus. Risk factors for LGA include, but are not limited to, constitutional factors, pre-existing or gestational diabetes, maternal pre-pregnancy obesity, excessive gestational weight gain, abnormal fasting or postprandial blood glucose levels, dyslipidemia, a history of prior macrosomic newborns (defined as birth weight over 4000 g), and post-term pregnancy. Racial and ethnic factors may also contribute. Diagnosis is made via  prenatal ultrasound; however, the prediction of neonatal weight remains imprecise, with an estimated variance of up to 20% between the expected and actual birth weight. Notably, ultrasound accuracy declines as fetal weight exceeds 4000 g, making precise estimation increasingly difficult. LGA serves as a surrogate marker for fetal macrosomia, which is defined by an absolute birth weight of over 4000 g, regardless of gestational age. The estimated fetal weight at the time of delivery may influence the mode of delivery. In the absence of gestational diabetes, cesarean delivery should be considered if the estimated fetal weight exceeds 5000 g to reduce the risk of higher-order perineal lacerations, which may lead to urinary or fecal incontinence, pelvic organ prolapse, and shoulder dystocia--with or without permanent fetal neurologic injury or, in rare cases, fetal death.  Sonographic findings Single intrauterine pregnancy. Fetal cardiac activity: Observed. Presentation: Cephalic. Interval fetal anatomy appears normal. Fetal biometry shows the estimated fetal weight at the 95 percentile. Amniotic fluid: Within normal limits.  MVP: 7.42 cm. Placenta: Anterior. BPP: 8/10.   There are limitations of prenatal ultrasound such as the inability to detect certain abnormalities due to poor visualization. Various factors such as fetal position, gestational age and maternal body habitus may increase the difficulty in visualizing the fetal anatomy.    Recommendations -Weekly antenatal testing until delivery - F/u growth US  in 4 weeks  Review of Systems: A review of systems was performed and was negative except per HPI   Past Obstetrical History:  OB History  Gravida Para Term Preterm AB Living  6 4 4  0 1 4  SAB IAB Ectopic Multiple Live Births  1 0 0 0 4    # Outcome Date GA Lbr Len/2nd  Weight Sex Type Anes PTL Lv  6 Current           5 SAB 2021 [redacted]w[redacted]d         4 Term 09/30/14 [redacted]w[redacted]d 15:30 / 00:03 7 lb  12.9 oz (3.541 kg) F Vag-Spont None  LIV  3 Term 07/13/13 [redacted]w[redacted]d 10:16 / 00:07 9 lb 9.3 oz (4.345 kg) F Vag-Spont None  LIV  2 Term 03/31/11 [redacted]w[redacted]d  8 lb (3.629 kg) M Vag-Spont None  LIV  1 Term 03/23/06 [redacted]w[redacted]d  8 lb 5 oz (3.771 kg) F Vag-Spont EPI  LIV     Past Medical History:  Past Medical History:  Diagnosis Date   Anemia    Anxiety    Gallstones 09/24/2020   Headache(784.0)    Migraine   Urinary tract infection    Varicose vein of leg 01/08/2015     Past Surgical History:    Past Surgical History:  Procedure Laterality Date   CHOLECYSTECTOMY N/A 09/24/2020   Procedure: LAPAROSCOPIC CHOLECYSTECTOMY;  Surgeon: Curvin Deward MOULD, MD;  Location: [facility] OR;  Service: General;  Laterality: N/A;     Home Medications:   Current Outpatient Medications on File Prior to Visit  Medication Sig Dispense Refill   acetaminophen  (TYLENOL ) 500 MG tablet Take 500 mg by mouth every 6 (six) hours as needed for mild pain (pain score 1-3) or moderate pain (pain score 4-6).     acetaminophen -caffeine  (EXCEDRIN TENSION HEADACHE) 500-65 MG TABS per tablet Take 2 tablets by mouth every 8 (eight) hours as needed. 60 tablet 0   aspirin  EC 81 MG tablet Take 1 tablet (81 mg total) by mouth daily. Swallow whole. 30 tablet 12   ferrous sulfate  325 (65 FE) MG EC tablet Take 325 mg by mouth 3 (three) times daily with meals.     Prenatal Vit-Fe Fumarate-FA (PRENATAL MULTIVITAMIN) TABS tablet Take 1 tablet by mouth daily at 12 noon. 90 tablet 1   cetirizine  (ZYRTEC ) 10 MG tablet Take 1 tablet (10 mg total) by mouth daily. (Patient not taking: Reported on 06/03/2024) 30 tablet 11   cyclobenzaprine  (FLEXERIL ) 5 MG tablet Take 1 tablet (5 mg total) by mouth 3 (three) times daily as needed. (Patient not taking: Reported on 06/03/2024) 20 tablet 0   Doxylamine -Pyridoxine  10-10 MG TBEC Take 2 tabs at bedtime. If needed, add another tab in the morning. If needed, add another tab in the afternoon, up to 4 tabs/day. (Patient not  taking: Reported on 06/03/2024) 120 tablet 0   famotidine  (PEPCID ) 40 MG tablet Take 1 tablet (40 mg total) by mouth once daily. (Patient not taking: Reported on 06/03/2024) 30 tablet 1   FLUoxetine  (PROZAC ) 10 MG tablet Take 1 tablet (10 mg total) by mouth daily. (Patient not taking: Reported on 06/03/2024) 30 tablet 1   fluticasone  (FLONASE ) 50 MCG/ACT nasal spray Place 2 sprays into both nostrils daily. (Patient not taking: Reported on 06/03/2024) 16 g 6   hydrOXYzine  (ATARAX ) 25 MG tablet Take 1 tablet (25 mg total) by mouth every 8 (eight) hours as needed for anxiety. (Patient not taking: Reported on 06/03/2024) 30 tablet 0   ondansetron  (ZOFRAN -ODT) 4 MG disintegrating tablet Dissolve 1 tablet (4 mg total) by mouth every 6 (six) hours as needed for nausea. (Patient not taking: Reported on 06/03/2024) 20 tablet 0   Vitamin D , Ergocalciferol , (DRISDOL ) 1.25 MG (50000 UNIT) CAPS capsule Take 1 capsule (50,000 Units total) by mouth every 7 (seven) days. (Patient not taking: Reported on 06/03/2024) 4  capsule 2   No current facility-administered medications on file prior to visit.      Allergies:   Allergies  Allergen Reactions   Keflex  [Cephalexin ] Itching and Rash     Physical Exam:   Vitals:   06/03/24 0822  BP: 97/63   Sitting comfortably on the sonogram table Nonlabored breathing Normal rate and rhythm Abdomen is nontender  Thank you for the opportunity to be involved with this patient's care. Please let us  know if we can be of any further assistance.   30 minutes of time was spent reviewing the patient's chart including labs, imaging and documentation.  At least 50% of this time was spent with direct patient care discussing the diagnosis, management and prognosis of her care.  Creig Jackson Patch MFM, [place]   06/03/2024  11:30 AM

## 2024-06-06 ENCOUNTER — Ambulatory Visit: Payer: Self-pay | Admitting: Family Medicine

## 2024-06-06 VITALS — BP 94/63 | HR 98 | Wt 202.8 lb

## 2024-06-06 DIAGNOSIS — Z3480 Encounter for supervision of other normal pregnancy, unspecified trimester: Secondary | ICD-10-CM

## 2024-06-06 NOTE — Progress Notes (Signed)
  [facility] Prenatal Visit  Debra Allen is a 38 y.o. H3E5985 at [redacted]w[redacted]d here for routine follow up. She is dated by early ultrasound.  She reports contractions since last week. Every day 2-3 hours and relieved by deep breathing. Usually lasts less than a minute.  She reports fetal movement. She denies vaginal bleeding, or loss of fluid.  See flow sheet for details.  Vitals:   06/06/24 1141  BP: 94/63  Pulse: 98     A/P: Pregnancy at [redacted]w[redacted]d.  Doing well.   Routine prenatal care:  Dating reviewed, dating tab is correct Fetal heart tones: Appropriate Fundal height: within expected range.  The patient does not have a history of HSV and valacyclovir is not indicated at this time.  The patient does not have a history of Cesarean delivery and no referral to [facility] is indicated Infant feeding choice: Both  Contraception choice: vasectomy  Infant circumcision desired not applicable Influenza vaccine not administered as not influenza season.   Tdap was not given today., previously given  Abrysvo RSV vaccine was discussed today not in season  COVID vaccination was discussed and patient is not interested.  Childbirth and education classes were not offered, 5th pregnancy  Pregnancy education regarding benefits of breastfeeding, contraception, fetal growth, expected weight gain, and safe infant sleep were discussed.  Preterm labor and fetal movement precautions reviewed.   2. Pregnancy issues include the following and were addressed as appropriate today:  LGA - Patient is scheduled for another ultrasound in 4 weeks and weekly antenatal testing. No evidence of GDM.   Problem list and pregnancy box updated: Yes.   Scheduled for Ob Faculty clinic in third trimester on 06/19/2024. [name]   Follow up 2 weeks.

## 2024-06-06 NOTE — Patient Instructions (Addendum)
 You have a follow up appointment on 06/19/2024. I will check to make sure you have those weekly appointments for listening to the baby   Prenatal Classes Go to [email or web address] for more information on the pregnancy and child birth classes that [place] has to offer.   Vaccinations If you are with the Adopt a Mom Program and need vaccines, these are done the [facility] Department on [address] [place]. The number to call to schedule an appt is [phone]  Pregnancy Related Return Precautions The follow are signs/symptoms that are abnormal in pregnancy and may require further evaluation by a physician: Go to the MAU at [facility] at [facility] if: You have cramping/contractions that do not go away with drinking water, especially if they are lasting 30 seconds to 1.5 minutes, coming and going every 5-10 minutes for an hour or more, or are getting stronger and you cannot walk or talk while having a contraction/cramp. Your water breaks.  Sometimes it is a big gush of fluid, sometimes it is just a trickle that keeps getting your underwear wet or running down your legs You have vaginal bleeding.    You do not feel your baby moving like normal.  If you do not, get something to eat and drink (something cold or something with sugar like peanut butter or juice) and lay down and focus on feeling your baby move. If your baby is still not moving like normal, you should go to MAU. You should feel your baby move 6 times in one hour, or 10 times in two hours. You have a persistent headache that does not go away with 1 g of Tylenol , vision changes, chest pain, difficulty breathing, severe pain in your right upper abdomen, worsening leg swelling- these can all be signs of high blood pressure in pregnancy and need to be evaluated by a provider immediately  These are all concerning in pregnancy and if you have any of these I recommend you call your  PCP and present to the Maternity Admissions Unit (map below) for further evaluation.  For any pregnancy-related emergencies, please go to the Maternity Admissions Unit in the [facility] at [facility]. You will use hospital Entrance C.    Our clinic number is [phone].

## 2024-06-10 ENCOUNTER — Other Ambulatory Visit: Payer: Self-pay

## 2024-06-10 ENCOUNTER — Ambulatory Visit: Payer: Self-pay | Attending: Nurse Practitioner

## 2024-06-16 ENCOUNTER — Ambulatory Visit (HOSPITAL_BASED_OUTPATIENT_CLINIC_OR_DEPARTMENT_OTHER): Payer: Self-pay

## 2024-06-16 ENCOUNTER — Other Ambulatory Visit: Payer: Self-pay | Admitting: *Deleted

## 2024-06-16 ENCOUNTER — Ambulatory Visit: Payer: Self-pay | Attending: Obstetrics and Gynecology | Admitting: Obstetrics and Gynecology

## 2024-06-16 VITALS — BP 105/64 | HR 83

## 2024-06-16 DIAGNOSIS — O09523 Supervision of elderly multigravida, third trimester: Secondary | ICD-10-CM | POA: Insufficient documentation

## 2024-06-16 DIAGNOSIS — O3663X0 Maternal care for excessive fetal growth, third trimester, not applicable or unspecified: Secondary | ICD-10-CM

## 2024-06-16 DIAGNOSIS — Z3A34 34 weeks gestation of pregnancy: Secondary | ICD-10-CM

## 2024-06-16 DIAGNOSIS — E669 Obesity, unspecified: Secondary | ICD-10-CM

## 2024-06-16 DIAGNOSIS — O3660X0 Maternal care for excessive fetal growth, unspecified trimester, not applicable or unspecified: Secondary | ICD-10-CM

## 2024-06-16 DIAGNOSIS — O358XX0 Maternal care for other (suspected) fetal abnormality and damage, not applicable or unspecified: Secondary | ICD-10-CM

## 2024-06-16 DIAGNOSIS — O99213 Obesity complicating pregnancy, third trimester: Secondary | ICD-10-CM

## 2024-06-16 DIAGNOSIS — Z363 Encounter for antenatal screening for malformations: Secondary | ICD-10-CM | POA: Insufficient documentation

## 2024-06-16 NOTE — Progress Notes (Signed)
 After review, MFM consult with provider is not indicated for today  Arna Ranks, MD 06/16/2024 8:37 AM  [facility]

## 2024-06-16 NOTE — Progress Notes (Unsigned)
 [facility] Visit  Debra Allen is a 38 y.o. [id number] at [redacted]w[redacted]d (via LMP c/w 8 wjk sono) who presents to [facility] for routine follow up. Seen today along with resident Dr. Lorrane and Dr Jerrie. Prenatal course, history, notes, ultrasounds, and laboratory results reviewed.  Denies vaginal bleeding, or decreased fetal movement. Notes large gush of fluid at 1800 yesterday, clear, no odor, and still leaking. Irregular contractions that started Sunday, every 30-40 minutes. Taking PNV.    Primary Prenatal Care Provider: Dr Cleotilde  Postpartum Plans: - delivery planning: SVD - circumcision: n/a female - feeding: both - [facility]: [facility] - contraception: vasectomy for partner, counseled on other options  FHR: 143 bpm Uterine size: 39 cm (known LGA, getting growth US  with MFM)  Assessment & Plan  1. Routine prenatal care: - Tdap offered but pt not set up yet with [facility] financial assistance, will do at follow up - vertex on sono today  2. Leakage of fluid- large clear gush noted on 06/18/24 at 1800, soaking through pants. Clear, no odor. Discussed MAU evaluation, normal FHT, pt in agreement. Only irregular contractions every 40 min and mild, did not perform SVE.   3. LGA infant- passed 3 hr GTT. EFW 95th%ile on recent growth sono. MFM recommending follow up can in 2 weeks for fetal growth assessment. Largest previous baby 4345g without issue.and per pt report no shoulder dystocia but had episiotomy cut. Follow up growth scan and will discuss with MFM if earlier induction recommended.  4. AMA- LR NIPT.   5. IDA- on ferrous sulfate , repeat CBC at follow up, not done today as pt sent to MAU for PPROM work up.   Next prenatal visit in 1.5 weeks- needs Tdap, GBS, GC/Chlamydia testing, sono for fetal position. Labor & fetal movement precautions discussed.  Rollene Keeling, MD [facility]

## 2024-06-19 ENCOUNTER — Inpatient Hospital Stay (HOSPITAL_COMMUNITY)
Admission: AD | Admit: 2024-06-19 | Discharge: 2024-06-19 | Disposition: A | Discharge status: Home / Self Care | Payer: Self-pay | Attending: Obstetrics and Gynecology | Admitting: Obstetrics and Gynecology

## 2024-06-19 ENCOUNTER — Ambulatory Visit: Payer: Self-pay | Admitting: Family Medicine

## 2024-06-19 ENCOUNTER — Encounter (HOSPITAL_COMMUNITY): Payer: Self-pay | Admitting: Obstetrics and Gynecology

## 2024-06-19 VITALS — BP 120/62 | HR 89 | Wt 202.8 lb

## 2024-06-19 DIAGNOSIS — Z3A34 34 weeks gestation of pregnancy: Secondary | ICD-10-CM

## 2024-06-19 DIAGNOSIS — O09523 Supervision of elderly multigravida, third trimester: Secondary | ICD-10-CM | POA: Insufficient documentation

## 2024-06-19 DIAGNOSIS — O2343 Unspecified infection of urinary tract in pregnancy, third trimester: Secondary | ICD-10-CM

## 2024-06-19 DIAGNOSIS — Z3480 Encounter for supervision of other normal pregnancy, unspecified trimester: Secondary | ICD-10-CM

## 2024-06-19 DIAGNOSIS — Z0371 Encounter for suspected problem with amniotic cavity and membrane ruled out: Secondary | ICD-10-CM | POA: Insufficient documentation

## 2024-06-19 LAB — WET PREP, GENITAL
Clue Cells Wet Prep HPF POC: NONE SEEN
Sperm: NONE SEEN
Trich, Wet Prep: NONE SEEN
WBC, Wet Prep HPF POC: >=10 — AB (ref <10)
Yeast Wet Prep HPF POC: NONE SEEN

## 2024-06-19 LAB — URINALYSIS, ROUTINE W REFLEX MICROSCOPIC
Bilirubin Urine: NEGATIVE
Glucose, UA: NEGATIVE mg/dL
Hgb urine dipstick: NEGATIVE
Ketones, ur: NEGATIVE mg/dL
Nitrite: NEGATIVE
Protein, ur: NEGATIVE mg/dL
Specific Gravity, Urine: 1.016 (ref 1.005–1.030)
pH: 6 (ref 5.0–8.0)

## 2024-06-19 LAB — RUPTURE OF MEMBRANE (ROM)PLUS: Rom Plus: NEGATIVE

## 2024-06-19 LAB — POCT FERN TEST: POCT Fern Test: NEGATIVE

## 2024-06-19 MED ORDER — FOSFOMYCIN TROMETHAMINE 3 G PO PACK
3.0000 g | PACK | Freq: Once | ORAL | Status: AC
  Administered 2024-06-19: 3 g via ORAL
  Filled 2024-06-19: qty 3

## 2024-06-19 NOTE — MAU Provider Note (Signed)
 Maternal Assessment Unit Provider Note  S [name] is a 38 y.o. [phone] pregnant female at [redacted]w[redacted]d who presents to MAU today sent from clinic with complaint of loss of fluid.   She reports last night she woke up about 7 PM with wet sheets and underwear.  After that she has had to use the restroom frequently, 6 times overnight.  She had some pain in her back and contractions overnight but contractions have stopped since coming to the MAU. Primarily feels worst suprapubic pressure with trying to urinate. She reports feeling similar to when she has had a UTI in the past with her pregnancies.  Clear vaginal discharge and good fetal movement.   Prenatal history complicated by LGA baby, AMA.   Receives care at [facility]. Prenatal records reviewed.  Pertinent items noted in HPI and remainder of comprehensive ROS otherwise negative.   O BP 102/67 (BP Location: Right Arm)   Pulse 86   Temp 98.4 F (36.9 C) (Oral)   Resp 17   Ht 5' 3 (1.6 m)   Wt 91.8 kg   LMP 10/21/2023 (Approximate)   SpO2 100%   BMI 35.85 kg/m  Physical Exam Constitutional:      General: She is not in acute distress.    Appearance: She is not ill-appearing or toxic-appearing.  HENT:     Head: Normocephalic and atraumatic.  Cardiovascular:     Rate and Rhythm: Normal rate and regular rhythm.  Pulmonary:     Effort: Pulmonary effort is normal.     Breath sounds: Normal breath sounds.  Abdominal:     General: Bowel sounds are normal.     Tenderness: There is no right CVA tenderness or left CVA tenderness.     Comments: Gravid uterus  Genitourinary:    Vagina: Vaginal discharge present.     Cervix: Normal.     Comments: Os not grossly dilated. No pooling in vaginal vault Skin:    General: Skin is warm and dry.  Neurological:     General: No focal deficit present.     Mental Status: She is alert.     Results for orders placed or performed during the hospital encounter of  06/19/24 (from the past 24 hours)  Wet prep, genital     Status: Abnormal   Collection Time: 06/19/24 10:25 AM   Specimen: Vaginal  Result Value Ref Range   Yeast Wet Prep HPF POC NONE SEEN NONE SEEN   Trich, Wet Prep NONE SEEN NONE SEEN   Clue Cells Wet Prep HPF POC NONE SEEN NONE SEEN   WBC, Wet Prep HPF POC >=10 (A) <10   Sperm NONE SEEN   Urinalysis, Routine w reflex microscopic -Urine, Clean Catch     Status: Abnormal   Collection Time: 06/19/24 10:25 AM  Result Value Ref Range   Color, Urine YELLOW YELLOW   APPearance HAZY (A) CLEAR   Specific Gravity, Urine 1.016 1.005 - 1.030   pH 6.0 5.0 - 8.0   Glucose, UA NEGATIVE NEGATIVE mg/dL   Hgb urine dipstick NEGATIVE NEGATIVE   Bilirubin Urine NEGATIVE NEGATIVE   Ketones, ur NEGATIVE NEGATIVE mg/dL   Protein, ur NEGATIVE NEGATIVE mg/dL   Nitrite NEGATIVE NEGATIVE   Leukocytes,Ua MODERATE (A) NEGATIVE   RBC / HPF 0-5 0 - 5 RBC/hpf   WBC, UA 11-20 0 - 5 WBC/hpf   Bacteria, UA RARE (A) NONE SEEN   Squamous Epithelial / HPF 6-10 0 - 5 /HPF  Mucus PRESENT   Rupture of Membrane (ROM) Plus     Status: None   Collection Time: 06/19/24 10:25 AM  Result Value Ref Range   Rom Plus NEGATIVE   Fern Test     Status: Normal   Collection Time: 06/19/24 10:30 AM  Result Value Ref Range   POCT Fern Test Negative = intact amniotic membranes       MDM: Moderate risk  MAU Course: Patient seen and evaluated by provider. Initial fern test negative, but nursing noted significant discharge and she c/o of UTI symptoms--wet prep, UA, GC swabs ordered. Pelvic  Time: 1120 Cervical exam: 2/80/-3 FHT: baseline 140 bpm, moderate variability, + accelerations, + 1 variable deceleration (after lying flat on back for SVE),  Contractions: rare Category II   Time: 1150 FHT: baseline 130 bpm, moderate variability, + accelerations, no decelerations,  Contractions: none.  Category I  Urinalysis positive for UTI. Culture ordered. Fosfomycin  ordered for patient. She declined pain medication.   FHT reviewed with Dr. Cresenzo prior to discharge  Results and treatment were discussed with the patient prior to discharge.  Questions were answered to the satisfaction of the patient and/or family prior to discharge.      A   ICD-10-CM   1. Urinary tract infection in mother during third trimester of pregnancy  O23.43      Medical screening exam complete  P -SROM workup completed and negative -urine culture ordered -fosfomycin 3g 1x dose (due to cephalosporin allergy and busy mom) no concern for pyelonephritis  Discharge from MAU in stable condition with strict labor precautions Follow up at [facility] as scheduled for ongoing prenatal care  Case, plan, FHT reviewed with Dr. Cresenzo prior to discharge  Allergies as of 06/19/2024       Reactions   Keflex  [cephalexin ] Itching, Rash        Medication List     TAKE these medications    acetaminophen  500 MG tablet Commonly known as: TYLENOL  Take 500 mg by mouth every 6 (six) hours as needed for mild pain (pain score 1-3) or moderate pain (pain score 4-6).   aspirin  EC 81 MG tablet Take 1 tablet (81 mg total) by mouth daily. Swallow whole.   ferrous sulfate  325 (65 FE) MG EC tablet Take 325 mg by mouth 3 (three) times daily with meals.   prenatal multivitamin Tabs tablet Take 1 tablet by mouth daily at 12 noon.        Trudy Leeroy NOVAK, MD 06/19/2024 11:41 AM

## 2024-06-19 NOTE — MAU Note (Signed)
 Debra Allen is a 38 y.o. at [redacted]w[redacted]d here in MAU reporting: noted ? Fluid last night.  Woke up and the bed was wet. Just a little fluid since then. No bleeding .  Denies recent intercourse.  Reports +FM.  Started having lower back pain yesterday, comes and goes.  Sent from office for eval Onset of complaint: yesterday Pain score: moderate Vitals:   06/19/24 1007  BP: 102/67  Pulse: 86  Resp: 17  Temp: 98.4 F (36.9 C)  SpO2: 100%     FHT:139 Lab orders placed from triage:

## 2024-06-19 NOTE — Discharge Instructions (Signed)
 It was very nice to meet you today, and congratulations on your pregnancy!  The good new is your water has not broken yet. You have been diagnosed a urinary tract infection. You were given fosfomycin and antibiotic in the ER that will treat your UTI. You do not need additional medication. Please drink lots of water to help your body recover from the UTI. If you experience worsening of symptoms, worsening back pain, fevers or other concerning symptoms, call your doctor or return to the ER.  Call your OB Clinic or go to [facility] if: You begin to have strong, frequent contractions Your water breaks.  Sometimes it is a big gush of fluid, sometimes it is just a trickle that keeps getting your panties wet or running down your legs You have vaginal bleeding.  It is normal to have a small amount of spotting if your cervix was checked.  You don't feel your baby moving like normal.  If you don't, get you something to eat and drink and lay down and focus on feeling your baby move.  You should feel at least 10 movements in 2 hours.  If you don't, you should call the office or go to [facility].

## 2024-06-19 NOTE — Progress Notes (Signed)
  [facility] Prenatal Visit  Debra Allen is a 38 y.o. [id number] at [redacted]w[redacted]d here for routine follow up. She is dated by LMP, early ultrasound.  She reports leaking  of fluid, backache, contractions since Sunday, no bleeding.  She reports fetal movement. She denies vaginal bleeding, contractions, or loss of fluid.  See flow sheet for details.  Vitals:   06/19/24 0847  BP: 120/62  Pulse: 89     A/P: Pregnancy at [redacted]w[redacted]d.  Doing well.   Routine prenatal care:  Dating reviewed, dating tab is correct Fetal heart tones: Appropriate Fundal height: within expected range.  The patient does not have a history of HSV and valacyclovir is not indicated at this time.  The patient does not have a history of Cesarean delivery and no referral to Center for [facility] is indicated Infant feeding choice: Both  Contraception choice: Vasectomy Infant circumcision desired not applicable Influenza vaccine not administered as not influenza season.   Tdap was not given today. Will give once financial assistance is established. Childbirth and education classes were offered. Pregnancy education regarding benefits of breastfeeding, contraception, fetal growth, expected weight gain were discussed.  Preterm labor and fetal movement precautions reviewed.   2. Pregnancy issues include the following and were addressed as appropriate today:  Leakage of fluid - sent to MAU for further PPROM workup, LGA with growth sonos weekly, followed by MFM  Problem list and pregnancy box updated: Yes.    Follow up 2 weeks.

## 2024-06-19 NOTE — Patient Instructions (Addendum)
    Please go the MAU to ensure your water has not broken.  WE scheudled you a follow up in 2 weeks.  Prenatal Classes Go to [email or web address] for more information on the pregnancy and child birth classes that [place] has to offer.   Vaccinations If you are with the [employer] Mom Program and need vaccines, these are done the [facility] Department on [address] [place]. The number to call to schedule an appt is [phone]  Pregnancy Related Return Precautions The follow are signs/symptoms that are abnormal in pregnancy and may require further evaluation by a physician: Go to the MAU at [facility] at [facility] if: You have cramping/contractions that do not go away with drinking water, especially if they are lasting 30 seconds to 1.5 minutes, coming and going every 5-10 minutes for an hour or more, or are getting stronger and you cannot walk or talk while having a contraction/cramp. Your water breaks.  Sometimes it is a big gush of fluid, sometimes it is just a trickle that keeps getting your underwear wet or running down your legs You have vaginal bleeding.    You do not feel your baby moving like normal.  If you do not, get something to eat and drink (something cold or something with sugar like peanut butter or juice) and lay down and focus on feeling your baby move. If your baby is still not moving like normal, you should go to MAU. You should feel your baby move 6 times in one hour, or 10 times in two hours. You have a persistent headache that does not go away with 1 g of Tylenol , vision changes, chest pain, difficulty breathing, severe pain in your right upper abdomen, worsening leg swelling- these can all be signs of high blood pressure in pregnancy and need to be evaluated by a provider immediately  These are all concerning in pregnancy and if you have any of these I recommend you call your PCP and present to the  Maternity Admissions Unit (map below) for further evaluation.  For any pregnancy-related emergencies, please go to the Maternity Admissions Unit in the [facility]'s & Children's Center at [facility]. You will use hospital Entrance C.    Our clinic number is [phone].   Dr Donzetta

## 2024-06-20 ENCOUNTER — Ambulatory Visit: Payer: Self-pay

## 2024-06-20 DIAGNOSIS — O2343 Unspecified infection of urinary tract in pregnancy, third trimester: Secondary | ICD-10-CM

## 2024-06-20 LAB — CULTURE, OB URINE: Culture: 40000 — AB

## 2024-06-20 LAB — GC/CHLAMYDIA PROBE AMP (~~LOC~~) NOT AT ARMC
Chlamydia: NEGATIVE
Comment: NEGATIVE
Comment: NORMAL
Neisseria Gonorrhea: NEGATIVE

## 2024-06-20 MED ORDER — PENICILLIN V POTASSIUM 500 MG PO TABS: 500.0000 mg | ORAL_TABLET | Freq: Four times a day (QID) | ORAL | Status: DC

## 2024-06-23 ENCOUNTER — Other Ambulatory Visit: Payer: Self-pay

## 2024-06-23 ENCOUNTER — Other Ambulatory Visit (HOSPITAL_COMMUNITY): Payer: Self-pay

## 2024-06-24 ENCOUNTER — Ambulatory Visit: Payer: Self-pay

## 2024-06-24 ENCOUNTER — Other Ambulatory Visit: Payer: Self-pay

## 2024-06-24 ENCOUNTER — Other Ambulatory Visit: Payer: Self-pay | Admitting: Family Medicine

## 2024-06-25 ENCOUNTER — Other Ambulatory Visit (HOSPITAL_COMMUNITY): Payer: Self-pay

## 2024-06-25 MED ORDER — PENICILLIN V POTASSIUM 500 MG PO TABS
500.0000 mg | ORAL_TABLET | Freq: Four times a day (QID) | ORAL | 0 refills | Status: DC
Start: 2024-06-25 — End: 2024-07-17
  Filled 2024-06-25 (×2): qty 20, 5d supply, fill #0

## 2024-07-01 ENCOUNTER — Encounter: Payer: Self-pay | Admitting: Family Medicine

## 2024-07-01 NOTE — Progress Notes (Unsigned)
  [facility] Prenatal Visit  [name] is a 38 y.o. [id number] at [redacted]w[redacted]d here for routine follow up. She is dated by LMP, c/w 8 wk sono.  She reports {symptoms; pregnancy related:14538}. She reports fetal movement. She denies vaginal bleeding, contractions, or loss of fluid. See flow sheet for details.  There were no vitals filed for this visit.  A/P: Pregnancy at [redacted]w[redacted]d.  Doing well.   Routine prenatal care  Dating reviewed, dating tab is {correct:23336::correct} Fetal heart tones {appropriate:23337} Fundal height {fundal height:23342::within expected range. } Fetal position confirmed {vertex:23350::Vertex} using {ultrasound or Le[place]:23351::Ultrasound }.  GBS collected today. .  Repeat GC/CT not collected today due to negative on 06/19/24.  The patient {does/does not:200015::does not} have a history of HSV and valacyclovir {ACTION; IS/IS NOT:21021397::is not} indicated at this time.  Infant feeding choice: Both  Contraception choice: {postpartum contraception:23348} Infant circumcision desired not applicable Influenza vaccine {given:23340}  Tdap {Tdap:23345::previously administered between 27-36 weeks } COVID vaccination was discussed and ***.  BPP scheduled between 40-41 weeks (aim for 40.1 WGA) on *** Abrysvo RSV vaccine was discussed today (***if patient is between 32.0 to 36.6 WGA from Sept - Jan), and *** Pregnancy education regarding preterm labor, fetal movement,  benefits of breastfeeding, contraception, fetal growth, expected weight gain, and safe infant sleep were discussed.    2. Pregnancy issues include the following and were addressed as appropriate today:   ***  Problem list and pregnancy box updated: {yes/no:20286::Yes}.  Follow up 1 week.

## 2024-07-02 ENCOUNTER — Ambulatory Visit: Payer: Self-pay

## 2024-07-02 ENCOUNTER — Ambulatory Visit (INDEPENDENT_AMBULATORY_CARE_PROVIDER_SITE_OTHER): Payer: Self-pay | Admitting: Family Medicine

## 2024-07-02 VITALS — BP 97/74 | HR 92 | Wt 206.4 lb

## 2024-07-02 DIAGNOSIS — Z3480 Encounter for supervision of other normal pregnancy, unspecified trimester: Secondary | ICD-10-CM

## 2024-07-02 DIAGNOSIS — Z3493 Encounter for supervision of normal pregnancy, unspecified, third trimester: Secondary | ICD-10-CM

## 2024-07-02 LAB — POCT URINALYSIS DIP (MANUAL ENTRY)
Bilirubin, UA: NEGATIVE
Glucose, UA: NEGATIVE mg/dL
Ketones, POC UA: NEGATIVE mg/dL
Nitrite, UA: NEGATIVE
Protein Ur, POC: NEGATIVE mg/dL
Spec Grav, UA: 1.02 (ref 1.010–1.025)
Urobilinogen, UA: 0.2 U/dL
pH, UA: 6 (ref 5.0–8.0)

## 2024-07-02 LAB — POCT UA - MICROSCOPIC ONLY
Epithelial cells, urine per micros: >20
WBC, Ur, HPF, POC: >20 (ref 0–5)

## 2024-07-02 MED ORDER — TETANUS-DIPHTH-ACELL PERTUSSIS 5-2.5-18.5 LF-MCG/0.5 IM SUSP: 0.5000 mL | Freq: Once | INTRAMUSCULAR | 0 refills | Status: AC

## 2024-07-02 NOTE — Patient Instructions (Signed)
 It was great to see you today! Thank you for choosing [facility] for your primary care. Debra Allen was seen for 36 week OB visit.  Today we addressed: Tdap vaccine - please go to the health department [address] GBS swab performed today Repeating urine and culture Follow up in 1 week  We are checking some labs today. I will send you a MyChart message with your results, per your preference. If you do not hear about your labs in the next 2 weeks, please call the office.   You should return to our clinic No follow-ups on file. Please arrive 15 minutes before your appointment to ensure smooth check in process.  We appreciate your efforts in making this happen.  Thank you for allowing me to participate in your care, Kathrine Melena, DO 07/02/2024, 9:24 AM PGY-2, [facility]

## 2024-07-03 ENCOUNTER — Telehealth: Payer: Self-pay

## 2024-07-03 LAB — CBC
Hematocrit: 33.4 % — ABNORMAL LOW (ref 34.0–46.6)
Hemoglobin: 10.8 g/dL — ABNORMAL LOW (ref 11.1–15.9)
MCH: 27.8 pg (ref 26.6–33.0)
MCHC: 32.3 g/dL (ref 31.5–35.7)
MCV: 86 fL (ref 79–97)
Platelets: 197 x10E3/uL (ref 150–450)
RBC: 3.89 x10E6/uL (ref 3.77–5.28)
RDW: 15.4 % (ref 11.7–15.4)
WBC: 6.7 x10E3/uL (ref 3.4–10.8)

## 2024-07-03 NOTE — Telephone Encounter (Signed)
-----   Message from Kathrine Melena sent at 07/02/2024  2:55 PM EDT ----- Regarding: BPP Scheduling Hey,  Do you mind calling the office back regarding the BPP because it needs to be post-dates BPP not scheduled for 8/26?  Thank you, Kathrine

## 2024-07-03 NOTE — Telephone Encounter (Signed)
 Attempted to reach patient to inform of BPP appt that was rescheduled for Sept. 16th at 2:00pm. LVM.Nelson Land, CMA

## 2024-07-04 ENCOUNTER — Ambulatory Visit (HOSPITAL_COMMUNITY): Payer: Self-pay | Admitting: Family Medicine

## 2024-07-04 ENCOUNTER — Other Ambulatory Visit (HOSPITAL_COMMUNITY): Payer: Self-pay

## 2024-07-04 LAB — URINE CULTURE, OB REFLEX

## 2024-07-04 LAB — CULTURE, OB URINE

## 2024-07-06 LAB — STREP GP B CULTURE+RFLX

## 2024-07-07 ENCOUNTER — Telehealth: Payer: Self-pay

## 2024-07-07 ENCOUNTER — Ambulatory Visit (INDEPENDENT_AMBULATORY_CARE_PROVIDER_SITE_OTHER): Payer: Self-pay | Admitting: Student

## 2024-07-07 VITALS — BP 99/66 | HR 90 | Wt 207.8 lb

## 2024-07-07 DIAGNOSIS — Z3480 Encounter for supervision of other normal pregnancy, unspecified trimester: Secondary | ICD-10-CM

## 2024-07-07 NOTE — Patient Instructions (Signed)
  Future Appointments  Date Time Provider Department Center  07/16/2024  9:30 AM Cleotilde Perkins, DO [facility]  07/29/2024  2:00 PM [facility] PROVIDER 1 [facility]  07/29/2024  2:15 PM [facility] US1 [facility]    Please arrive 15 minutes before your appointment to ensure smooth check in process.    Please call the clinic at [phone] if your symptoms worsen or you have any concerns.  Thank you for allowing me to participate in your care, Dr. Perkins Cleotilde [facility]   Pregnancy Related Return Precautions The follow are signs/symptoms that are abnormal in pregnancy and may require further evaluation by a physician: Go to the MAU at [facility] at [facility] if: You have cramping/contractions that do not go away with drinking water, especially if they are lasting 30 seconds to 1.5 minutes, coming and going every 5-10 minutes for an hour or more, or are getting stronger and you cannot walk or talk while having a contraction/cramp. Your water breaks.  Sometimes it is a big gush of fluid, sometimes it is just a trickle that keeps getting your underwear wet or running down your legs You have vaginal bleeding.    You do not feel your baby moving like normal.  If you do not, get something to eat and drink (something cold or something with sugar like peanut butter or juice) and lay down and focus on feeling your baby move. If your baby is still not moving like normal, you should go to MAU. You should feel your baby move 6 times in one hour, or 10 times in two hours. You have a persistent headache that does not go away with 1 g of Tylenol , vision changes, chest pain, difficulty breathing, severe pain in your right upper abdomen, worsening leg swelling- these can all be signs of high blood pressure in pregnancy and need to be evaluated by a provider immediately  These are all concerning in pregnancy and if you have any of these I recommend you call your PCP and present to the  Maternity Admissions Unit (map below) for further evaluation.  For any pregnancy-related emergencies, please go to the Maternity Admissions Unit in the Women's & Children's Center at [facility]. You will use hospital Entrance C.    Our clinic number is [phone].   Perkins Cleotilde, DO [facility], PGY-3 07/07/24 2:07 PM

## 2024-07-07 NOTE — Telephone Encounter (Signed)
 Called MFM per Dr. Dianne request to reschedule patient's OB US  sooner than 07/29/24. Was able to schedule patient for 07/15/24 at 1115am.  Called patient to inform her about her upcoming US  appointment for 07/15/24 at 1115am.  Harlene Reiter, CMA

## 2024-07-07 NOTE — Progress Notes (Signed)
  [facility] Prenatal Visit  Debra Allen is a 38 y.o. [id number] at [redacted]w[redacted]d here for routine follow up. She is dated by LMP, c/w 8 week US .  She reports fatigue, no bleeding, no leaking, and occasional contractions. She reports fetal movement. She denies vaginal bleeding, contractions, or loss of fluid. See flow sheet for details.  There were no vitals filed for this visit.  A/P: Pregnancy at [redacted]w[redacted]d.  Doing well.   Routine prenatal care  Dating reviewed, dating tab is correct Fetal heart tones Appropriate Fundal height >2 cm from expected size given dating, discussed with preceptor.  Fetal position confirmed Vertex using Ultrasound .  GBS not collected today due to previous collection 8/20 - at this time still pending. .  Repeat GC/CT not collected today due to previous collection which was negative .  The patient does not have a history of HSV and valacyclovir is not indicated at this time.  Infant feeding choice: Both  Contraception choice: Vasectomy  Infant circumcision desired not applicable Influenza vaccine not administered as not influenza season.   Tdap previously administered between 27-36 weeks  COVID vaccination not offered d/t time of year.  BPP scheduled between 40-41 weeks (aim for 40.1 WGA) on 07/29/24 Abrysvo RSV vaccine N/A Pregnancy education regarding preterm labor, fetal movement,  benefits of breastfeeding, contraception, fetal growth, expected weight gain, and safe infant sleep were discussed.    2. Pregnancy issues include the following and were addressed as appropriate today:   GBS status pending, will follow results   Problem list and pregnancy box updated: Yes.   Called MFM to get growth ultrasound rescheduled for early September.  Previously the fetus was measuring large on growth scans. She measured large today.  Patient may need early induction for macrosomia.  Follow up 1 week.

## 2024-07-08 ENCOUNTER — Ambulatory Visit: Payer: Self-pay

## 2024-07-11 ENCOUNTER — Encounter (HOSPITAL_COMMUNITY): Payer: Self-pay | Admitting: Obstetrics and Gynecology

## 2024-07-11 ENCOUNTER — Inpatient Hospital Stay (HOSPITAL_COMMUNITY)
Admission: AD | Admit: 2024-07-11 | Discharge: 2024-07-11 | Disposition: A | Discharge status: Home / Self Care | Payer: Self-pay | Attending: Obstetrics and Gynecology | Admitting: Obstetrics and Gynecology

## 2024-07-11 DIAGNOSIS — O99213 Obesity complicating pregnancy, third trimester: Secondary | ICD-10-CM | POA: Insufficient documentation

## 2024-07-11 DIAGNOSIS — Z3A37 37 weeks gestation of pregnancy: Secondary | ICD-10-CM

## 2024-07-11 DIAGNOSIS — O09523 Supervision of elderly multigravida, third trimester: Secondary | ICD-10-CM | POA: Insufficient documentation

## 2024-07-11 DIAGNOSIS — O3663X0 Maternal care for excessive fetal growth, third trimester, not applicable or unspecified: Secondary | ICD-10-CM | POA: Insufficient documentation

## 2024-07-11 DIAGNOSIS — O471 False labor at or after 37 completed weeks of gestation: Secondary | ICD-10-CM

## 2024-07-11 DIAGNOSIS — O368130 Decreased fetal movements, third trimester, not applicable or unspecified: Secondary | ICD-10-CM

## 2024-07-11 NOTE — MAU Note (Signed)
 Debra Allen is a 38 y.o. at [redacted]w[redacted]d here in MAU reporting: contractions and pressure since 5 am. Did not feel fetal movement for the last 3 hours and felt her abd was continually hard. Reports she did feel movement when she was in the lobby  Onset of complaint: 5 am Pain score: 8/10 There were no vitals filed for this visit.   FHT: 146  Lab orders placed from triage: none

## 2024-07-11 NOTE — MAU Provider Note (Signed)
 Chief Complaint:  Contractions and Decreased Fetal Movement   HPI     Debra Allen is a 38 y.o. H3E5985 at [redacted]w[redacted]d who presents to maternity admissions reporting contractions and pelvic pressure since 5 AM.  Patient also reports she has had decreased fetal movement for the last 3 hours.  Denies any vaginal bleeding or  leaking of fluid.   Her pregnancy is complicated by AMA, obesity and pregnancy, LGA fetus  [facility]   Past Medical History:  Diagnosis Date   Anemia    Anxiety    Gallstones 09/24/2020   Headache(784.0)    Migraine   Urinary tract infection    Varicose vein of leg 01/08/2015   OB History  Gravida Para Term Preterm AB Living  6 4 4  0 1 4  SAB IAB Ectopic Multiple Live Births  1 0 0 0 4    # Outcome Date GA Lbr Len/2nd Weight Sex Type Anes PTL Lv  6 Current           5 SAB 2021 [redacted]w[redacted]d         4 Term 09/30/14 [redacted]w[redacted]d 15:30 / 00:03 3541 g F Vag-Spont None  LIV  3 Term 07/13/13 [redacted]w[redacted]d 10:16 / 00:07 4345 g F Vag-Spont None  LIV  2 Term 03/31/11 [redacted]w[redacted]d  3629 g M Vag-Spont None  LIV  1 Term 03/23/06 [redacted]w[redacted]d  3771 g F Vag-Spont EPI  LIV   Past Surgical History:  Procedure Laterality Date   CHOLECYSTECTOMY N/A 09/24/2020   Procedure: LAPAROSCOPIC CHOLECYSTECTOMY;  Surgeon: Curvin Deward MOULD, MD;  Location: [facility] OR;  Service: General;  Laterality: N/A;   Family History  Problem Relation Age of Onset   Diabetes Mother    Hypertension Father    Cancer Paternal Aunt        bladder cancer    Hypertension Maternal Grandmother    Diabetes Maternal Grandmother    Cancer Paternal Grandmother    Cancer Paternal Grandfather    Asthma Other    Obesity Other    Sleep apnea Other    Heart disease Other    Other Neg Hx    Social History   Tobacco Use   Smoking status: Never    Passive exposure: Never   Smokeless tobacco: Never  Vaping Use   Vaping status: Never Used  Substance Use Topics   Alcohol use: No   Drug use: No   Allergies   Allergen Reactions   Penicillins    Keflex  [Cephalexin ] Itching and Rash   Medications Prior to Admission  Medication Sig Dispense Refill Last Dose/Taking   aspirin  EC 81 MG tablet Take 1 tablet (81 mg total) by mouth daily. Swallow whole. 30 tablet 12 Past Week   ferrous sulfate  325 (65 FE) MG EC tablet Take 325 mg by mouth 3 (three) times daily with meals.   07/11/2024   Prenatal Vit-Fe Fumarate-FA (PRENATAL MULTIVITAMIN) TABS tablet Take 1 tablet by mouth daily at 12 noon. 90 tablet 1 07/10/2024   acetaminophen  (TYLENOL ) 500 MG tablet Take 500 mg by mouth every 6 (six) hours as needed for mild pain (pain score 1-3) or moderate pain (pain score 4-6).      penicillin  v potassium (VEETID) 500 MG tablet Take 1 tablet (500 mg total) by mouth 4 (four) times daily. 20 tablet 0     I have reviewed patient's Past Medical Hx, Surgical Hx, Family Hx, Social Hx, medications and allergies.   ROS  Pertinent items  noted in HPI and remainder of comprehensive ROS otherwise negative.   PHYSICAL EXAM  Patient Vitals for the past 24 hrs:  BP Temp Temp src Pulse Resp SpO2 Height Weight  07/11/24 1426 110/68 98 F (36.7 C) Oral 95 18 99 % 5' 3 (1.6 m) 94.2 kg  07/11/24 1425 -- -- -- -- -- 99 % -- --    Constitutional: Well-developed, well-nourished obese female in no acute distress.  Cardiovascular: normal rate & rhythm, warm and well-perfused Respiratory: normal effort, no problems with respiration noted GI: Abd soft, non-tender, gravid MS: Extremities nontender, no edema, normal ROM Neurologic: Alert and oriented x 4.  GU: no CVA tenderness Pelvic: chaperoned by Arna Blush , RN SVE:  1/thick/high     Fetal Tracing: Cat 1 reactive Baseline:135-140 Variability:moderate  Accelerations: present Decelerations:absent Toco: occasional ctx's     MDM & MAU COURSE  MDM:  HIGH  Rule out labor/ DFM @37 .5 GA  - NST for gestational age and fetal reassurance (patient acknowledges fetal  movements as evidenced by verbally stating she feels fetal movement and by using the clicker on NST) - No evidence of active labor at this time - Fetal kick counts discussed with patient and written information provided - Plan for discharge   I have reviewed the patient chart and performed the physical exam . I have ordered & interpreted the lab results and reviewed and interpreted the NST Medications ordered as stated below.  A/P as described below.  Counseling and education provided and patient agreeable  with plan as described below. Verbalized understanding.    ASSESSMENT   1. False labor after 37 completed weeks of gestation   2. Decreased fetal movements in third trimester, single or unspecified fetus   3. [redacted] weeks gestation of pregnancy     PLAN  Discharge home in stable condition with return precautions.   See AVS for full description of information given to the patient including both verbal and written. Patient verbalized understanding and agrees with the plan as described above.     Follow-up Information     [facility] - A Dept Of [place]. [facility] Follow up.   Specialty: Family Medicine Why: If symptoms worsen or fail to resolve, As scheduled for ongoing prenatal care Contact information: [address] [place]  [phone] Additional information: [address] [place], [place] [address]                Allergies as of 07/11/2024       Reactions   Penicillins    Keflex  [cephalexin ] Itching, Rash        Medication List     TAKE these medications    acetaminophen  500 MG tablet Commonly known as: TYLENOL  Take 500 mg by mouth every 6 (six) hours as needed for mild pain (pain score 1-3) or moderate pain (pain score 4-6).   aspirin  EC 81 MG tablet Take 1 tablet (81 mg total) by mouth daily. Swallow whole.   ferrous sulfate  325 (65 FE) MG EC tablet Take 325 mg by mouth 3 (three) times  daily with meals.   penicillin  v potassium 500 MG tablet Commonly known as: VEETID Take 1 tablet (500 mg total) by mouth 4 (four) times daily.   prenatal multivitamin Tabs tablet Take 1 tablet by mouth daily at 12 noon.        Olam Dalton, MSN, [facility], [facility] [employer]

## 2024-07-15 ENCOUNTER — Inpatient Hospital Stay (HOSPITAL_COMMUNITY)
Admission: AD | Admit: 2024-07-15 | Discharge: 2024-07-17 | DRG: 807 | Disposition: A | Discharge status: Home / Self Care | Payer: MEDICAID | Attending: Obstetrics and Gynecology | Admitting: Obstetrics and Gynecology

## 2024-07-15 ENCOUNTER — Ambulatory Visit: Payer: Self-pay | Attending: Obstetrics and Gynecology

## 2024-07-15 ENCOUNTER — Other Ambulatory Visit: Payer: Self-pay

## 2024-07-15 ENCOUNTER — Ambulatory Visit (HOSPITAL_BASED_OUTPATIENT_CLINIC_OR_DEPARTMENT_OTHER): Payer: Self-pay | Admitting: Maternal & Fetal Medicine

## 2024-07-15 ENCOUNTER — Encounter (HOSPITAL_COMMUNITY): Payer: Self-pay | Admitting: Family Medicine

## 2024-07-15 VITALS — BP 103/64 | HR 88

## 2024-07-15 DIAGNOSIS — Z3A38 38 weeks gestation of pregnancy: Secondary | ICD-10-CM

## 2024-07-15 DIAGNOSIS — E669 Obesity, unspecified: Secondary | ICD-10-CM

## 2024-07-15 DIAGNOSIS — Z362 Encounter for other antenatal screening follow-up: Secondary | ICD-10-CM | POA: Insufficient documentation

## 2024-07-15 DIAGNOSIS — O3663X0 Maternal care for excessive fetal growth, third trimester, not applicable or unspecified: Secondary | ICD-10-CM | POA: Insufficient documentation

## 2024-07-15 DIAGNOSIS — O09523 Supervision of elderly multigravida, third trimester: Secondary | ICD-10-CM | POA: Insufficient documentation

## 2024-07-15 DIAGNOSIS — O368130 Decreased fetal movements, third trimester, not applicable or unspecified: Secondary | ICD-10-CM | POA: Diagnosis present

## 2024-07-15 DIAGNOSIS — Z833 Family history of diabetes mellitus: Secondary | ICD-10-CM

## 2024-07-15 DIAGNOSIS — Z3493 Encounter for supervision of normal pregnancy, unspecified, third trimester: Secondary | ICD-10-CM | POA: Insufficient documentation

## 2024-07-15 DIAGNOSIS — O3660X0 Maternal care for excessive fetal growth, unspecified trimester, not applicable or unspecified: Secondary | ICD-10-CM | POA: Insufficient documentation

## 2024-07-15 DIAGNOSIS — O99213 Obesity complicating pregnancy, third trimester: Secondary | ICD-10-CM | POA: Insufficient documentation

## 2024-07-15 DIAGNOSIS — Z8249 Family history of ischemic heart disease and other diseases of the circulatory system: Secondary | ICD-10-CM

## 2024-07-15 LAB — CBC
HCT: 33.9 % — ABNORMAL LOW (ref 36.0–46.0)
Hemoglobin: 10.8 g/dL — ABNORMAL LOW (ref 12.0–15.0)
MCH: 26.5 pg (ref 26.0–34.0)
MCHC: 31.9 g/dL (ref 30.0–36.0)
MCV: 83.1 fL (ref 80.0–100.0)
Platelets: 208 K/uL (ref 150–400)
RBC: 4.08 MIL/uL (ref 3.87–5.11)
RDW: 14.9 % (ref 11.5–15.5)
WBC: 6.1 K/uL (ref 4.0–10.5)
nRBC: 0 % (ref 0.0–0.2)

## 2024-07-15 LAB — TYPE AND SCREEN
ABO/RH(D): O POS
Antibody Screen: NEGATIVE

## 2024-07-15 MED ORDER — FLEET ENEMA RE ENEM
1.0000 | ENEMA | RECTAL | Status: DC | PRN
Start: 2024-07-15 — End: 2024-07-16

## 2024-07-15 MED ORDER — TERBUTALINE SULFATE 1 MG/ML IJ SOLN: 0.2500 mg | Freq: Once | INTRAMUSCULAR | Status: DC | PRN

## 2024-07-15 MED ORDER — ACETAMINOPHEN 325 MG PO TABS: 650.0000 mg | ORAL_TABLET | ORAL | Status: DC | PRN

## 2024-07-15 MED ORDER — LACTATED RINGERS IV SOLN: INTRAVENOUS | Status: DC

## 2024-07-15 MED ORDER — SOD CITRATE-CITRIC ACID 500-334 MG/5ML PO SOLN: 30.0000 mL | ORAL | Status: DC | PRN

## 2024-07-15 MED ORDER — OXYTOCIN-SODIUM CHLORIDE 30-0.9 UT/500ML-% IV SOLN
1.0000 m[IU]/min | INTRAVENOUS | Status: DC
  Administered 2024-07-15: 2 m[IU]/min via INTRAVENOUS
  Filled 2024-07-15: qty 500

## 2024-07-15 MED ORDER — LACTATED RINGERS IV SOLN: 500.0000 mL | INTRAVENOUS | Status: DC | PRN

## 2024-07-15 MED ORDER — LIDOCAINE HCL (PF) 1 % IJ SOLN: 30.0000 mL | INTRAMUSCULAR | Status: DC | PRN

## 2024-07-15 MED ORDER — OXYCODONE-ACETAMINOPHEN 5-325 MG PO TABS: 1.0000 | ORAL_TABLET | ORAL | Status: DC | PRN

## 2024-07-15 MED ORDER — OXYCODONE-ACETAMINOPHEN 5-325 MG PO TABS: 2.0000 | ORAL_TABLET | ORAL | Status: DC | PRN

## 2024-07-15 MED ORDER — OXYTOCIN BOLUS FROM INFUSION
333.0000 mL | Freq: Once | INTRAVENOUS | Status: AC
  Administered 2024-07-16: 333 mL via INTRAVENOUS

## 2024-07-15 MED ORDER — ONDANSETRON HCL 4 MG/2ML IJ SOLN: 4.0000 mg | Freq: Four times a day (QID) | INTRAMUSCULAR | Status: DC | PRN

## 2024-07-15 MED ORDER — TRANEXAMIC ACID-NACL 1000-0.7 MG/100ML-% IV SOLN
1000.0000 mg | Freq: Once | INTRAVENOUS | Status: AC
  Administered 2024-07-16: 1000 mg via INTRAVENOUS
  Filled 2024-07-15: qty 100

## 2024-07-15 MED ORDER — OXYTOCIN-SODIUM CHLORIDE 30-0.9 UT/500ML-% IV SOLN
2.5000 [IU]/h | INTRAVENOUS | Status: DC
  Administered 2024-07-16: 2.5 [IU]/h via INTRAVENOUS

## 2024-07-15 NOTE — Progress Notes (Signed)
   Patient information  Patient Name: Debra Allen  Patient MRN:   [id number]  Referring practice: MFM Referring Provider: Aristes - family medicine  Problem List   Patient Active Problem List   Diagnosis Date Noted   AMA (advanced maternal age) multigravida 35+, third trimester 06/19/2024   Large for gestational age fetus affecting mother, antepartum, third trimester, single gestation 06/03/2024   Decreased fetal movement in pregnancy, antepartum, third trimester, single gestation 06/03/2024   Supervision of other normal pregnancy, antepartum 02/18/2024   Anemia 12/31/2014   Mildly obese 06/10/2014    Maternal Fetal medicine Consult  Debra Allen is a 38 y.o. H3E5985 at [redacted]w[redacted]d here for ultrasound and consultation. Debra Allen is doing well today with no acute concerns. Today we focused on the following:   Decreased FM with BPP of 4/8. The patient was in MAU 3 days ago and reports decreased FM. I discussed the concern for stillbirth and the recommendation of delivery. She says her baby was moving earlier today but it has been less than usual. She states she is agreeable to delivery. She needs to drop off her daughter first but will go directly to L&D after this.   The patient had time to ask questions that were answered to her satisfaction.  She verbalized understanding and agrees to proceed with the plan below.  Recommendations - Direct admission to L&D for BPP 4/8.   Review of Systems: A review of systems was performed and was negative except per HPI   Vitals and Physical Exam    07/15/2024   11:31 AM 07/11/2024    4:18 PM 07/11/2024    2:26 PM  Vitals with BMI  Height   5' 3  Weight   207 lbs 11 oz  BMI   36.8  Systolic 103 105 889  Diastolic 64 68 68  Pulse 88 89 95    Sitting comfortably on the sonogram table Nonlabored breathing Normal rate and rhythm Abdomen is nontender  Past pregnancies OB History  Gravida Para Term Preterm AB  Living  6 4 4  0 1 4  SAB IAB Ectopic Multiple Live Births  1 0 0 0 4    # Outcome Date GA Lbr Len/2nd Weight Sex Type Anes PTL Lv  6 Current           5 SAB 2021 [redacted]w[redacted]d         4 Term 09/30/14 [redacted]w[redacted]d 15:30 / 00:03 7 lb 12.9 oz (3.541 kg) F Vag-Spont None  LIV  3 Term 07/13/13 [redacted]w[redacted]d 10:16 / 00:07 9 lb 9.3 oz (4.345 kg) F Vag-Spont None  LIV  2 Term 03/31/11 [redacted]w[redacted]d  8 lb (3.629 kg) M Vag-Spont None  LIV  1 Term 03/23/06 [redacted]w[redacted]d  8 lb 5 oz (3.771 kg) F Vag-Spont EPI  LIV     I spent 20 minutes reviewing the patients chart, including labs and images as well as counseling the patient about her medical conditions. Greater than 50% of the time was spent in direct face-to-face patient counseling.  Delora Smaller  MFM, [place]   07/15/2024  1:01 PM

## 2024-07-15 NOTE — H&P (Addendum)
 Debra Allen is a 38 y.o. female presenting for IOL for BPP 4/8 with a reactive NST. Patient has a hx of LGA. Hx of PPH with G3 pregnancy.  OB History     Gravida  6   Para  4   Term  4   Preterm  0   AB  1   Living  4      SAB  1   IAB  0   Ectopic  0   Multiple  0   Live Births  4          Past Medical History:  Diagnosis Date   Anemia    Anxiety    Gallstones 09/24/2020   Headache(784.0)    Migraine   Urinary tract infection    Varicose vein of leg 01/08/2015   Past Surgical History:  Procedure Laterality Date   CHOLECYSTECTOMY N/A 09/24/2020   Procedure: LAPAROSCOPIC CHOLECYSTECTOMY;  Surgeon: Curvin Mt III, MD;  Location: [facility];  Service: General;  Laterality: N/A;   Family History: family history includes Asthma in an other family member; Cancer in her paternal aunt, paternal grandfather, and paternal grandmother; Diabetes in her maternal grandmother and mother; Heart disease in an other family member; Hypertension in her father and maternal grandmother; Obesity in an other family member; Sleep apnea in an other family member. Social History:  reports that she has never smoked. She has never been exposed to tobacco smoke. She has never used smokeless tobacco. She reports that she does not drink alcohol and does not use drugs.           NURSING  PROVIDER  Office Location [facility] Dating by LMP c/w U/S at 8 wks  Adopt A Mom? Yes Anatomy U/S Normal, LGA  Initiated care at  13wks                 Language  English or Spanish               LAB RESULTS   Support Person Husband Genetics NIPS: LR          AFP:                  Carrier Screen Horizon:  Rhogam  O/Positive/-- (03/04 0847) A1C/GTT Early:  elevated 1 hr,    3 hour nml       Third trimester: Elevated 1hr, nml 3hr  Flu Vaccine        TDaP Vaccine   Blood Type O/Positive/-- (03/04 0847)  Covid Vaccine Declined Antibody Negative (03/04 0847)  RSV Arleen Vax   Rubella 17.10 (03/04 0847)   Feeding Plan Both RPR Non Reactive (06/11 1355)  Contraception Vasectomy HBsAg Negative (03/04 0847)  Circumcision N/A HIV Non Reactive (06/11 1355)  [facility]ian  [facility] HCVAb Non Reactive (03/04 0847)  Prenatal Classes No          Pap       Diagnosis  Date Value Ref Range Status  04/27/2022     Final    - Negative for Intraepithelial Lesions or Malignancy (NILM)  04/27/2022 - Benign reactive/reparative changes   Final    BTL Consent   GC/CT Initial:     Neg        36wks: Neg          VBAC Consent   GBS For PCN allergy, check sensitivities   Aspirin  indicated?  Yes      DME Rx [ ]  BP cuff [ ]   Weight Scale Waterbirth  [ ]  Class [ ]  Consent [ ]  CNM visit  PHQ9 & GAD7 [x]  new OB [x]  28 weeks  [x]  36 weeks Induction  [ ]  Orders Entered    Review of Systems  Constitutional:  Negative for chills, fatigue and fever.  Eyes:  Negative for pain and visual disturbance.  Respiratory:  Negative for apnea, shortness of breath and wheezing.   Cardiovascular:  Negative for chest pain and palpitations.  Gastrointestinal:  Negative for abdominal pain, constipation, diarrhea, nausea and vomiting.  Genitourinary:  Negative for difficulty urinating, dysuria, pelvic pain, vaginal bleeding, vaginal discharge and vaginal pain.  Musculoskeletal:  Negative for back pain.  Neurological:  Negative for seizures, weakness and headaches.  Psychiatric/Behavioral:  Negative for suicidal ideas.    Maternal Medical History:  Reason for admission: Nausea.  Fetal activity: Perceived fetal activity is normal.   Prenatal Complications - Diabetes: none.   Dilation: 2.5 Effacement (%): 50 Station: -3 Exam by:: Mitzie Sar RN Blood pressure 115/67, pulse 82, temperature 98.2 F (36.8 C), temperature source Oral, resp. rate 18, last menstrual period 10/21/2023, SpO2 100%. Maternal Exam:  Uterine Assessment: Contraction strength is mild.  Contraction frequency is irregular.  Abdomen: Patient  reports no abdominal tenderness. Fetal presentation: vertex Pelvis: adequate for delivery.   Cervix: Cervix evaluated by digital exam.     Fetal Exam Fetal Monitor Review: Baseline rate: 130.  Variability: moderate (6-25 bpm).   Pattern: accelerations present and no decelerations.   Fetal State Assessment: Category I - tracings are normal.   Physical Exam Vitals and nursing note reviewed.  Constitutional:      General: She is not in acute distress.    Appearance: Normal appearance.  HENT:     Head: Normocephalic.  Pulmonary:     Effort: Pulmonary effort is normal.  Genitourinary:    Comments: Dilation: 2.5 Effacement (%): 50 Cervical Position: Posterior Station: -3 Presentation: Vertex Exam by:: Mitzie Sar RN  Musculoskeletal:     Cervical back: Normal range of motion.  Skin:    General: Skin is warm and dry.  Neurological:     Mental Status: She is alert and oriented to person, place, and time.  Psychiatric:        Mood and Affect: Mood normal.     Prenatal labs: ABO, Rh: --/--/O POS (09/02 1450) Antibody: NEG (09/02 1450) Rubella: 17.10 (03/04 0847) RPR: Non Reactive (06/11 1355)  HBsAg: Negative (03/04 0847)  HIV: Non Reactive (06/11 1355)  GBS: Negative/-- (08/20 1236)   Assessment/Plan: Debra Allen , a  38 y.o. H3E5985 at [redacted]w[redacted]d presents for IOL for LGA with BPP 4/8 at term.   Labor: Plan to start pit 2x2 and continue to titrate up as needed  FWB: Cat I at this time, continue to monitor  I/D: GBS neg  MOF: Both  - Hx of PPH. Hgb Pending  Circ: N/A  MOC: Vasectomy  MOD: Hopeful for vaginal delivery   [name], MSN CNM  07/15/2024, 4:50 PM

## 2024-07-15 NOTE — Progress Notes (Signed)
 Labor Progress Note Madell Heino is a 38 y.o. H3E5985 at [redacted]w[redacted]d presenting for IOL for non-reassuring BPP  S: Introduced myself to the patient. She is on the birthing ball. Requesting AROM.  O:  BP 111/66   Pulse 83   Temp 98.1 F (36.7 C) (Axillary)   Resp 18   LMP 10/21/2023 (Approximate)   SpO2 100%  Lab Results  Component Value Date   HGB 10.8 (L) 07/15/2024    Time: 9:13 PM  FHT: baseline bpm 130, moderate variability, accelerations present, decelerations none,   Contractions: q every 2 min mins,    CVE: Dilation: 3 Effacement (%): 50 Cervical Position: Posterior Station: -3 Presentation: Vertex Exam by:: Leeroy Pouch, MD Attempted AROM without any fluid, pt very uncomfortable. Will try again later  A&P: 38 y.o. H3E5985 [redacted]w[redacted]d IOL for 4/8 BPP #Labor: Latent Labor Pitocin   #Pain: Labor support without medications #FWB: Category I #GBS negative #AMA  Leeroy KATHEE Pouch, MD 9:13 PM

## 2024-07-16 ENCOUNTER — Encounter (HOSPITAL_COMMUNITY): Payer: Self-pay | Admitting: Family Medicine

## 2024-07-16 ENCOUNTER — Encounter: Payer: Self-pay | Admitting: Student

## 2024-07-16 DIAGNOSIS — O3663X0 Maternal care for excessive fetal growth, third trimester, not applicable or unspecified: Secondary | ICD-10-CM

## 2024-07-16 DIAGNOSIS — O09523 Supervision of elderly multigravida, third trimester: Secondary | ICD-10-CM

## 2024-07-16 DIAGNOSIS — Z3A38 38 weeks gestation of pregnancy: Secondary | ICD-10-CM

## 2024-07-16 LAB — RPR: RPR Ser Ql: NONREACTIVE

## 2024-07-16 MED ORDER — EPHEDRINE 5 MG/ML INJ: 10.0000 mg | INTRAVENOUS | Status: DC | PRN

## 2024-07-16 MED ORDER — ZOLPIDEM TARTRATE 5 MG PO TABS: 5.0000 mg | ORAL_TABLET | Freq: Every evening | ORAL | Status: DC | PRN

## 2024-07-16 MED ORDER — PHENYLEPHRINE 80 MCG/ML (10ML) SYRINGE FOR IV PUSH (FOR BLOOD PRESSURE SUPPORT)
80.0000 ug | PREFILLED_SYRINGE | INTRAVENOUS | Status: DC | PRN
  Filled 2024-07-16: qty 10

## 2024-07-16 MED ORDER — BENZOCAINE-MENTHOL 20-0.5 % EX AERO: 1.0000 | INHALATION_SPRAY | CUTANEOUS | Status: DC | PRN

## 2024-07-16 MED ORDER — DIPHENHYDRAMINE HCL 50 MG/ML IJ SOLN: 12.5000 mg | INTRAMUSCULAR | Status: DC | PRN

## 2024-07-16 MED ORDER — TETANUS-DIPHTH-ACELL PERTUSSIS 5-2.5-18.5 LF-MCG/0.5 IM SUSY: 0.5000 mL | PREFILLED_SYRINGE | Freq: Once | INTRAMUSCULAR | Status: DC

## 2024-07-16 MED ORDER — ACETAMINOPHEN 325 MG PO TABS
650.0000 mg | ORAL_TABLET | ORAL | Status: DC | PRN
  Administered 2024-07-16 (×2): 650 mg via ORAL
  Filled 2024-07-16 (×2): qty 2

## 2024-07-16 MED ORDER — WITCH HAZEL-GLYCERIN EX PADS: 1.0000 | MEDICATED_PAD | CUTANEOUS | Status: DC | PRN

## 2024-07-16 MED ORDER — PRENATAL MULTIVITAMIN CH
1.0000 | ORAL_TABLET | Freq: Every day | ORAL | Status: DC
  Administered 2024-07-16 – 2024-07-17 (×2): 1 via ORAL
  Filled 2024-07-16 (×2): qty 1

## 2024-07-16 MED ORDER — FENTANYL CITRATE (PF) 100 MCG/2ML IJ SOLN
INTRAMUSCULAR | Status: AC
  Filled 2024-07-16: qty 2

## 2024-07-16 MED ORDER — PHENYLEPHRINE 80 MCG/ML (10ML) SYRINGE FOR IV PUSH (FOR BLOOD PRESSURE SUPPORT): 80.0000 ug | PREFILLED_SYRINGE | INTRAVENOUS | Status: DC | PRN

## 2024-07-16 MED ORDER — FENTANYL-BUPIVACAINE-NACL 0.5-0.125-0.9 MG/250ML-% EP SOLN
12.0000 mL/h | EPIDURAL | Status: DC | PRN
  Filled 2024-07-16: qty 250

## 2024-07-16 MED ORDER — OXYCODONE HCL 5 MG PO TABS: 5.0000 mg | ORAL_TABLET | ORAL | Status: DC | PRN

## 2024-07-16 MED ORDER — DIPHENHYDRAMINE HCL 25 MG PO CAPS: 25.0000 mg | ORAL_CAPSULE | Freq: Four times a day (QID) | ORAL | Status: DC | PRN

## 2024-07-16 MED ORDER — OXYTOCIN-SODIUM CHLORIDE 30-0.9 UT/500ML-% IV SOLN
INTRAVENOUS | Status: AC
  Filled 2024-07-16: qty 500

## 2024-07-16 MED ORDER — COCONUT OIL OIL: 1.0000 | TOPICAL_OIL | Status: DC | PRN

## 2024-07-16 MED ORDER — FENTANYL CITRATE (PF) 100 MCG/2ML IJ SOLN
100.0000 ug | INTRAMUSCULAR | Status: DC | PRN
  Administered 2024-07-16: 100 ug via INTRAVENOUS

## 2024-07-16 MED ORDER — DIBUCAINE (PERIANAL) 1 % EX OINT: 1.0000 | TOPICAL_OINTMENT | CUTANEOUS | Status: DC | PRN

## 2024-07-16 MED ORDER — OXYCODONE HCL 5 MG PO TABS: 10.0000 mg | ORAL_TABLET | ORAL | Status: DC | PRN

## 2024-07-16 MED ORDER — IBUPROFEN 600 MG PO TABS
600.0000 mg | ORAL_TABLET | Freq: Four times a day (QID) | ORAL | Status: DC
  Administered 2024-07-16 – 2024-07-17 (×6): 600 mg via ORAL
  Filled 2024-07-16 (×6): qty 1

## 2024-07-16 MED ORDER — SIMETHICONE 80 MG PO CHEW: 80.0000 mg | CHEWABLE_TABLET | ORAL | Status: DC | PRN

## 2024-07-16 MED ORDER — ONDANSETRON HCL 4 MG PO TABS: 4.0000 mg | ORAL_TABLET | ORAL | Status: DC | PRN

## 2024-07-16 MED ORDER — SENNOSIDES-DOCUSATE SODIUM 8.6-50 MG PO TABS
2.0000 | ORAL_TABLET | Freq: Every day | ORAL | Status: DC
  Administered 2024-07-17: 2 via ORAL
  Filled 2024-07-16: qty 2

## 2024-07-16 MED ORDER — ONDANSETRON HCL 4 MG/2ML IJ SOLN: 4.0000 mg | INTRAMUSCULAR | Status: DC | PRN

## 2024-07-16 MED ORDER — LACTATED RINGERS IV SOLN: 500.0000 mL | Freq: Once | INTRAVENOUS | Status: DC

## 2024-07-16 NOTE — Lactation Note (Addendum)
 This note was copied from a baby's chart. Lactation Consultation Note  Patient Name: Debra Allen Today's Date: 07/16/2024 Age:36 hours, P5  Reason for consult: Initial assessment;Early term 37-38.6wks See the next section down for the challenges mom has had with breast feeding with her 1st 75 , the youngest 36 years old.  LC offered to assist and baby was sleepy due being fed 25 ml 2 1/2 hours later 40 ml.  LC did offer to assess breast tissue due to mom saying she has flat nipples. LC assessed and noted erect nipples, just semi compressible areolas.  After assessment of tissue , 3 of her kids and add entered and she preferred to wait to feed.  Mom aware she can call for assistance.  Maternal Data Has patient been taught Hand Expression?: Yes (per mom + breast changes) Does the patient have breastfeeding experience prior to this delivery?: Yes How long did the patient breastfeed?: per mom out if her last 4 prior to were DL and would obtain a DEBP WIC and pumped for 3-4 montns and the most she pumped off 3-4 ozs.With the 1st 4 babies her nipples were more flat than for this baby. Would try a NS with WIC, but was to much.  Feeding Mother's Current Feeding Choice: Breast Milk and Formula  LATCH Score - not yet   Lactation Tools Discussed/Used    Interventions  Breast feeding goals for 24 hours - feed with cues and by 3 hours offer the breast STS, supply and demand , importance of allowing the baby to have practice at the breast.  Storage and cleaning pump guidelines, Lactation resources.   Discharge Pump:  (per mom no Medicaid or insurance. Active with WIC) WIC Program: Yes  Consult Status Consult Status: Follow-up Date: 07/16/24 Follow-up type: In-patient    Debra Allen 07/16/2024, 12:46 PM

## 2024-07-16 NOTE — Discharge Summary (Signed)
 Postpartum Discharge Summary  Date of Service updated     Patient Name: Debra Allen DOB: Aug 26, 1986 MRN: [id number]  Date of admission: 07/15/2024 Delivery date:07/16/2024 Delivering provider: TRUDY CZAR B Date of discharge: 07/17/2024  Admitting diagnosis: Indication for care in labor or delivery [O75.9] Intrauterine pregnancy: [redacted]w[redacted]d     Secondary diagnosis:  Active Problems:   NSVD (normal spontaneous vaginal delivery)  Additional problems: none    Discharge diagnosis: Term Pregnancy Delivered                                              Post partum procedures:N/A  Augmentation: AROM and Pitocin  Complications: None  Hospital course: Induction of Labor With Vaginal Delivery   38 y.o. yo H3E4984 at [redacted]w[redacted]d was admitted to the hospital 07/15/2024 for induction of labor.  Indication for induction: 4/8 BPP.  Patient had an uncomplicated labor course. Membrane Rupture Time/Date: 12:44 AM,07/16/2024  Delivery Method:Vaginal, Spontaneous Operative Delivery:N/A Episiotomy: None Lacerations:  1st degree;Perineal Details of delivery can be found in separate delivery note.  Patient had a postpartum course complicated by N/A . Patient is discharged home 07/17/24.  Newborn Data: Birth date:07/16/2024 Birth time:5:34 AM Gender:Female Living status:Living Apgars:8 ,9  Weight:3969 g  Magnesium Sulfate received: No BMZ received: No Rhophylac:No MMR:N/A T-DaP:to be given prior to discharge if patient chooses.  Flu: N/A RSV Vaccine received: No Transfusion:No  Immunizations received: Immunization History  Administered Date(s) Administered   Influenza Split 09/07/2011, 12/25/2012, 09/23/2015   Influenza,inj,Quad PF,6+ Mos 07/29/2014, 10/02/2016, 10/02/2017   Tdap 06/11/2013, 07/15/2014    Physical exam  Vitals:   07/16/24 1048 07/16/24 1748 07/16/24 2036 07/17/24 0536  BP: (!) 95/59 97/62 98/66  101/63  Pulse: 77 78 80 71  Resp:  18 18 17   Temp:  98.2 F (36.8 C)  98.2 F (36.8 C) 98 F (36.7 C)  TempSrc:  Oral Oral   SpO2: 98% 97% 100% 100%   General: alert, cooperative, and no distress Lochia: appropriate Uterine Fundus: firm Incision: N/A DVT Evaluation: No evidence of DVT seen on physical exam. No cords or calf tenderness. Labs: Lab Results  Component Value Date   WBC 6.1 07/15/2024   HGB 10.8 (L) 07/15/2024   HCT 33.9 (L) 07/15/2024   MCV 83.1 07/15/2024   PLT 208 07/15/2024      Latest Ref Rng & Units 03/24/2024    5:45 PM  CMP  Glucose 70 - 99 mg/dL 95   BUN 6 - 20 mg/dL 6   Creatinine 9.55 - 8.99 mg/dL 9.57   Sodium 864 - 854 mmol/L 137   Potassium 3.5 - 5.1 mmol/L 3.8   Chloride 98 - 111 mmol/L 107   CO2 22 - 32 mmol/L 22   Calcium 8.9 - 10.3 mg/dL 9.4   Total Protein 6.5 - 8.1 g/dL 6.0   Total Bilirubin 0.0 - 1.2 mg/dL 0.7   Alkaline Phos 38 - 126 U/L 58   AST 15 - 41 U/L 23   ALT 0 - 44 U/L 26    Edinburgh Score:    07/17/2024    5:36 AM  Edinburgh Postnatal Depression Scale Screening Tool  I have been able to laugh and see the funny side of things. 0  I have looked forward with enjoyment to things. 0  I have blamed myself unnecessarily when things went wrong. 0  I have been anxious or worried for no good reason. 0  I have felt scared or panicky for no good reason. 0  Things have been getting on top of me. 0  I have been so unhappy that I have had difficulty sleeping. 0  I have felt sad or miserable. 0  I have been so unhappy that I have been crying. 0  The thought of harming myself has occurred to me. 0  Edinburgh Postnatal Depression Scale Total 0   Edinburgh Postnatal Depression Scale Total: 0   After visit meds:  Allergies as of 07/17/2024       Reactions   Penicillins    Keflex  [cephalexin ] Itching, Rash        Medication List     STOP taking these medications    penicillin  v potassium 500 MG tablet Commonly known as: VEETID       TAKE these medications    acetaminophen  500 MG  tablet Commonly known as: TYLENOL  Take 500 mg by mouth every 6 (six) hours as needed for mild pain (pain score 1-3) or moderate pain (pain score 4-6).   aspirin  EC 81 MG tablet Take 1 tablet (81 mg total) by mouth daily. Swallow whole.   ferrous sulfate  325 (65 FE) MG EC tablet Take 325 mg by mouth 3 (three) times daily with meals.   ibuprofen  600 MG tablet Commonly known as: ADVIL  Take 1 tablet (600 mg total) by mouth every 6 (six) hours.   prenatal multivitamin Tabs tablet Take 1 tablet by mouth daily at 12 noon.        Discharge home in stable condition Infant Feeding: Bottle and Breast Infant Disposition:home with mother Discharge instruction: per After Visit Summary and Postpartum booklet. Activity: Advance as tolerated. Pelvic rest for 6 weeks.  Diet: routine diet Future Appointments: No future appointments.  Follow up Visit: Message sent to [facility] 07/16/24  Please schedule this patient for a In person postpartum visit in 4 weeks with the following provider: Any provider. Additional Postpartum F/U:none  High risk pregnancy complicated by: AMA, 4/8 BPP Delivery mode:  Vaginal, Spontaneous Anticipated Birth Control:  Vasectomy   07/17/2024 [name], CNM

## 2024-07-17 ENCOUNTER — Other Ambulatory Visit (HOSPITAL_COMMUNITY): Payer: Self-pay

## 2024-07-17 LAB — BIRTH TISSUE RECOVERY COLLECTION (PLACENTA DONATION)

## 2024-07-17 MED ORDER — IBUPROFEN 600 MG PO TABS
600.0000 mg | ORAL_TABLET | Freq: Four times a day (QID) | ORAL | 0 refills | Status: AC
  Filled 2024-07-17: qty 30, 8d supply, fill #0

## 2024-07-17 NOTE — Patient Instructions (Signed)
 If interested in an outpatient lactation consult in office or virtually please reach out to us  at [facility] (First Floor) [address] [place] Please call [phone] and press 4 for lactation.    Lactation support groups:  [facility], Tuesdays 10:00 am -12:00 pm at [address] on the second floor in the conference room, lactating parents and lap babies welcome.  [email or web address]  [email or web address]  Si est interesado en una consulta ambulatoria sobre lactancia en el consultorio o virtualmente, comunquese con nosotros al [facility] Geophysicist/field seismologist (primer piso) [address] [place], [place] Por favor llame al [phone] y presione 4 para lactancia.  Grupos de apoyo para la lactancia:  [facility], martes de 10:00 a. m. a 12:00 p. m., en [address], segundo piso, sala de conferencias. Se admiten madres lactantes y bebs en regazo.  Delaney Mandril, [facility] for [facility]

## 2024-07-17 NOTE — Lactation Note (Signed)
 This note was copied from a baby's chart. Lactation Consultation Note  Patient Name: Debra Allen Unijb'd Date: 07/17/2024 Age:36 hours Reason for consult: Mother's request;Early term 37-38.6wks;1st time breastfeeding Mom would like to BF baby as well as formula feed. Encouraged mom to put baby to the breast ans BF before giving formula. LC assisted mom in football hold. Lt. Nipple has more thickness than the Rt. Baby latched to both breast but could get deeper latch to the Rt. Baby not yet opening her mouth very wide yet. That will make it a lot easier when she opens her mouth wider. Newborn feeding habits, STS, I&O, positioning, hand placement, supporting baby reviewed. Gave mom hand pump to use as needed. Mom doesn't have a pump. Mom doesn't have insurance. Mom has WIC. She will apply for pump once she gets medicaid. Praised mom for good feeding. Maternal Data Has patient been taught Hand Expression?: Yes  Feeding Nipple Type: Slow - flow  LATCH Score Latch: Grasps breast easily, tongue down, lips flanged, rhythmical sucking.  Audible Swallowing: None  Type of Nipple: Everted at rest and after stimulation  Comfort (Breast/Nipple): Soft / non-tender  Hold (Positioning): Assistance needed to correctly position infant at breast and maintain latch.  LATCH Score: 7   Lactation Tools Discussed/Used Tools: Pump;Flanges Flange Size: 21;24 Breast pump type: Manual Pump Education: Setup, frequency, and cleaning Reason for Pumping: mom doesn't have a pump Pumping frequency: as needed  Interventions Interventions: Breast feeding basics reviewed;Assisted with latch;Skin to skin;Breast massage;Hand express;Pre-pump if needed;Reverse pressure;Breast compression;Adjust position;Support pillows;Position options;Hand pump;Education  Discharge Pump: Manual (mom doesn't have insurance) WIC Program: Yes  Consult Status Consult Status: Follow-up Date: 07/17/24 Follow-up  type: In-patient    Mahoganie Basher G 07/17/2024, 2:54 AM

## 2024-07-17 NOTE — Lactation Note (Signed)
 This note was copied from a baby's chart. Lactation Consultation Note  Patient Name: Debra Allen Unijb'd Date: 07/17/2024 Age:36 hours Reason for consult: Follow-up assessment;Early term 37-38.6wks  P5- MOB's feeding plan is to offer both breast milk and formula. MOB reports that infant takes to breast and bottle well. MOB denies having any questions or concerns at this time. LC reviewed the manual pump and how to clean it. LC reviewed engorgement/breast care and cluster feeding as well. LC encouraged MOB to call for further assistance as needed.  Maternal Data Has patient been taught Hand Expression?: Yes Does the patient have breastfeeding experience prior to this delivery?: Yes  Feeding Mother's Current Feeding Choice: Breast Milk and Formula Nipple Type: Slow - flow  Lactation Tools Discussed/Used Breast pump type: Manual Pump Education: Setup, frequency, and cleaning;Milk Storage Reason for Pumping: MOB has no pump for home use  Interventions Interventions: Breast feeding basics reviewed;Hand pump;Education;LC Services brochure  Discharge Discharge Education: Engorgement and breast care;Warning signs for feeding baby Pump: Manual WIC Program: Yes  Consult Status Consult Status: Complete Date: 07/17/24    Recardo Hoit BS, IBCLC 07/17/2024, 11:56 AM

## 2024-07-17 NOTE — Progress Notes (Signed)
 MOB was referred for history of anxiety.  * Referral screened out by Clinical Social Worker because none of the following criteria appear to apply:  ~ History of anxiety during this pregnancy, or of post-partum  depression following prior delivery.  ~ Diagnosis of anxiety within last 3 years  OR  * MOB's symptoms currently being treated with medication and/or therapy.  Per OB notes, MOB has an active prescription for Prozac 10mg  for support. Edinburgh score 0  Please contact the Clinical Social Worker if needs arise, by [facility] request, or if MOB scores greater than 9/yes to question 10 on Edinburgh Postpartum Depression Screen.  Debra Allen, [place] Clinical Social Worker [phone]

## 2024-07-22 ENCOUNTER — Encounter: Payer: Self-pay | Admitting: Student

## 2024-07-22 ENCOUNTER — Ambulatory Visit (INDEPENDENT_AMBULATORY_CARE_PROVIDER_SITE_OTHER): Payer: Self-pay | Admitting: Student

## 2024-07-22 ENCOUNTER — Inpatient Hospital Stay (HOSPITAL_COMMUNITY)
Admission: AD | Admit: 2024-07-22 | Discharge: 2024-07-22 | Disposition: A | Discharge status: Home / Self Care | Payer: MEDICAID | Attending: Obstetrics and Gynecology | Admitting: Obstetrics and Gynecology

## 2024-07-22 ENCOUNTER — Encounter (HOSPITAL_COMMUNITY): Payer: Self-pay | Admitting: Obstetrics and Gynecology

## 2024-07-22 ENCOUNTER — Inpatient Hospital Stay (HOSPITAL_COMMUNITY): Payer: MEDICAID

## 2024-07-22 ENCOUNTER — Other Ambulatory Visit: Payer: Self-pay

## 2024-07-22 VITALS — BP 120/82 | HR 68 | Ht 63.0 in | Wt 202.0 lb

## 2024-07-22 DIAGNOSIS — O1205 Gestational edema, complicating the puerperium: Secondary | ICD-10-CM

## 2024-07-22 DIAGNOSIS — M79605 Pain in left leg: Secondary | ICD-10-CM | POA: Insufficient documentation

## 2024-07-22 DIAGNOSIS — R748 Abnormal levels of other serum enzymes: Secondary | ICD-10-CM

## 2024-07-22 DIAGNOSIS — R0781 Pleurodynia: Secondary | ICD-10-CM

## 2024-07-22 DIAGNOSIS — R079 Chest pain, unspecified: Secondary | ICD-10-CM | POA: Insufficient documentation

## 2024-07-22 DIAGNOSIS — R1010 Upper abdominal pain, unspecified: Secondary | ICD-10-CM | POA: Insufficient documentation

## 2024-07-22 DIAGNOSIS — M7918 Myalgia, other site: Secondary | ICD-10-CM

## 2024-07-22 DIAGNOSIS — M79604 Pain in right leg: Secondary | ICD-10-CM | POA: Insufficient documentation

## 2024-07-22 DIAGNOSIS — R609 Edema, unspecified: Secondary | ICD-10-CM | POA: Insufficient documentation

## 2024-07-22 LAB — COMPREHENSIVE METABOLIC PANEL WITH GFR
ALT: 74 U/L — ABNORMAL HIGH (ref 0–44)
AST: 41 U/L (ref 15–41)
Albumin: 2.8 g/dL — ABNORMAL LOW (ref 3.5–5.0)
Alkaline Phosphatase: 122 U/L (ref 38–126)
Anion gap: 11 (ref 5–15)
BUN: 9 mg/dL (ref 6–20)
CO2: 22 mmol/L (ref 22–32)
Calcium: 8.7 mg/dL — ABNORMAL LOW (ref 8.9–10.3)
Chloride: 110 mmol/L (ref 98–111)
Creatinine, Ser: 0.55 mg/dL (ref 0.44–1.00)
GFR, Estimated: >60 mL/min (ref >60)
Glucose, Bld: 106 mg/dL — ABNORMAL HIGH (ref 70–99)
Potassium: 3.9 mmol/L (ref 3.5–5.1)
Sodium: 143 mmol/L (ref 135–145)
Total Bilirubin: 1 mg/dL (ref 0.0–1.2)
Total Protein: 5.8 g/dL — ABNORMAL LOW (ref 6.5–8.1)

## 2024-07-22 LAB — URINALYSIS, ROUTINE W REFLEX MICROSCOPIC
Bilirubin Urine: NEGATIVE
Glucose, UA: NEGATIVE mg/dL
Ketones, ur: NEGATIVE mg/dL
Nitrite: NEGATIVE
Protein, ur: NEGATIVE mg/dL
Specific Gravity, Urine: 1.011 (ref 1.005–1.030)
pH: 5 (ref 5.0–8.0)

## 2024-07-22 LAB — CBC
HCT: 30.9 % — ABNORMAL LOW (ref 36.0–46.0)
Hemoglobin: 9.9 g/dL — ABNORMAL LOW (ref 12.0–15.0)
MCH: 26.8 pg (ref 26.0–34.0)
MCHC: 32 g/dL (ref 30.0–36.0)
MCV: 83.7 fL (ref 80.0–100.0)
Platelets: 280 K/uL (ref 150–400)
RBC: 3.69 MIL/uL — ABNORMAL LOW (ref 3.87–5.11)
RDW: 15.5 % (ref 11.5–15.5)
WBC: 6.4 K/uL (ref 4.0–10.5)
nRBC: 0 % (ref 0.0–0.2)

## 2024-07-22 MED ORDER — FUROSEMIDE 10 MG/ML IJ SOLN
20.0000 mg | Freq: Once | INTRAMUSCULAR | Status: AC
  Administered 2024-07-22: 20 mg via INTRAVENOUS
  Filled 2024-07-22: qty 2

## 2024-07-22 MED ORDER — IOHEXOL 350 MG/ML SOLN
67.0000 mL | Freq: Once | INTRAVENOUS | Status: AC | PRN
  Administered 2024-07-22: 67 mL via INTRAVENOUS

## 2024-07-22 MED ORDER — CYCLOBENZAPRINE HCL 5 MG PO TABS: 5.0000 mg | ORAL_TABLET | Freq: Three times a day (TID) | ORAL | 0 refills | Status: DC | PRN

## 2024-07-22 NOTE — Progress Notes (Signed)
    SUBJECTIVE:   CHIEF COMPLAINT / HPI:   Debra Allen is a 38 y.o. female presenting for new onset leg swelling and pleuritic chest pain. She also endorses SOB with exertion that is new. The pain is very severe worse with deep inspiration.   Delivery course appears uncomplicated. She did not have any of these symptoms at discharge on 07/17/24.   PERTINENT  PMH / PSH: reviewed and updated.  OBJECTIVE:   BP 120/82   Pulse 68   Ht 5' 3 (1.6 m)   Wt 202 lb (91.6 kg)   SpO2 98%   Breastfeeding No   BMI 35.78 kg/m   Uncomfortable-appearing, no acute distress Cardio: Regular rate, regular rhythm, no murmurs on exam. Pulm: Clear, no wheezing, no crackles. No increased work of breathing Abdominal: diffusely tender  Extremities: +2 peripheral edema   ASSESSMENT/PLAN:   Assessment & Plan Pleuritic chest pain Presentation concerning for PE, needs CTA PE work up  BP within range however with leg swelling and headache concerning for pre-eclampsia. High risk for complications with AMA.  Discussed with attending Dr. Donzetta who also evaluated the patient and agreed with referral to MAU.  Called MAU and spoke to Ala Cart, CNM  EMS called for patient transport      Damien Pinal, DO [facility]

## 2024-07-22 NOTE — Patient Instructions (Signed)
 You will need to be evaluated by the [facility] Emergency Department since you are having leg swelling and pain in your chest.   Entrance C.

## 2024-07-22 NOTE — MAU Provider Note (Signed)
 Chief Complaint: Shortness of Breath, Abdominal Pain, Chest Pain, and Leg Swelling   Event Date/Time   First Provider Initiated Contact with Patient 07/22/24 1549      SUBJECTIVE HPI: Debra Allen is a 38 y.o. [id number] 1 week postpartum following vaginal delivery who presents to maternity admissions reporting increased swelling in both legs, pain in her upper abdomen and chest, and shortness of breath today.  Pt reports pain with movement/position change/deep breath in upper abdomen, bilaterally, making it hard to sleep.  Pt reports pushing for a long time using squatting bar on L&D bed and she thinks she may have muscle soreness from pushing. She has intermittent headaches, none today, that are consistent with her hx of migraine.  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   Past Surgical History:  Procedure Laterality Date   CHOLECYSTECTOMY N/A 09/24/2020   Procedure: LAPAROSCOPIC CHOLECYSTECTOMY;  Surgeon: Curvin Deward MOULD, MD;  Location: [facility] OR;  Service: General;  Laterality: N/A;   Social History   Socioeconomic History   Marital status: Significant Other    Spouse name: Not on file   Number of children: Not on file   Years of education: Not on file   Highest education level: Not on file  Occupational History   Not on file  Tobacco Use   Smoking status: Never    Passive exposure: Never   Smokeless tobacco: Never  Vaping Use   Vaping status: Never Used  Substance and Sexual Activity   Alcohol use: No   Drug use: No   Sexual activity: Yes  Other Topics Concern   Not on file  Social History Narrative   Not on file   Social Drivers of Health   Financial Resource Strain: Not on file  Food Insecurity: No Food Insecurity (07/15/2024)   Hunger Vital Sign    Worried About Running Out of Food in the Last Year: Never true     Ran Out of Food in the Last Year: Never true  Transportation Needs: No Transportation Needs (07/15/2024)   PRAPARE - Administrator, Civil Service (Medical): No    Lack of Transportation (Non-Medical): No  Physical Activity: Not on file  Stress: Not on file  Social Connections: Not on file  Intimate Partner Violence: Not At Risk (07/15/2024)   Humiliation, Afraid, Rape, and Kick questionnaire    Fear of Current or Ex-Partner: No    Emotionally Abused: No    Physically Abused: No    Sexually Abused: No   No current facility-administered medications on file prior to encounter.   Current Outpatient Medications on File Prior to Encounter  Medication Sig Dispense Refill   ibuprofen  (ADVIL ) 600 MG tablet Take 1 tablet (600 mg total) by mouth every 6 (six) hours. 30 tablet 0   acetaminophen  (TYLENOL ) 500 MG tablet Take 500 mg by mouth every 6 (six) hours as needed for mild pain (pain score 1-3) or moderate pain (pain score 4-6).     aspirin  EC 81 MG tablet Take 1 tablet (81 mg total) by mouth daily. Swallow whole. 30 tablet 12   ferrous sulfate  325 (65 FE) MG EC tablet Take 325 mg by mouth 3 (three) times daily with meals.     Prenatal Vit-Fe Fumarate-FA (PRENATAL MULTIVITAMIN) TABS tablet Take 1 tablet by  mouth daily at 12 noon. 90 tablet 1   Allergies  Allergen Reactions   Penicillins    Keflex  [Cephalexin ] Itching and Rash    ROS:  Review of Systems  Constitutional:  Negative for chills, fatigue and fever.  Respiratory:  Positive for shortness of breath.   Cardiovascular:  Negative for chest pain.  Gastrointestinal:  Positive for abdominal pain.  Genitourinary:  Positive for vaginal bleeding. Negative for difficulty urinating, dysuria, flank pain, pelvic pain, vaginal discharge and vaginal pain.  Neurological:  Negative for dizziness and headaches.  Psychiatric/Behavioral: Negative.       I have reviewed patient's Past Medical Hx, Surgical Hx, Family Hx, Social Hx,  medications and allergies.   Physical Exam  Patient Vitals for the past 24 hrs:  BP Temp Temp src Pulse Resp SpO2  07/22/24 1730 -- -- -- -- -- 96 %  07/22/24 1700 -- -- -- -- -- 98 %  07/22/24 1630 -- -- -- -- -- 98 %  07/22/24 1600 -- -- -- -- -- 99 %  07/22/24 1535 133/69 -- -- 62 -- 98 %  07/22/24 1530 -- -- -- -- -- 98 %  07/22/24 1523 135/74 98.5 F (36.9 C) Oral 65 20 99 %   Constitutional: Well-developed, well-nourished female in no acute distress.  HEART: normal rate, heart sounds, regular rhythm RESP: normal effort, lung sounds clear and equal bilaterally  GI: Abd soft, non-tender. Pos BS x 4 MS: Extremities nontender, no edema, normal ROM Neurologic: Alert and oriented x 4.  GU: Neg CVAT.    LAB RESULTS Results for orders placed or performed during the hospital encounter of 07/22/24 (from the past 24 hours)  CBC     Status: Abnormal   Collection Time: 07/22/24  4:23 PM  Result Value Ref Range   WBC 6.4 4.0 - 10.5 K/uL   RBC 3.69 (L) 3.87 - 5.11 MIL/uL   Hemoglobin 9.9 (L) 12.0 - 15.0 g/dL   HCT 69.0 (L) 63.9 - 53.9 %   MCV 83.7 80.0 - 100.0 fL   MCH 26.8 26.0 - 34.0 pg   MCHC 32.0 30.0 - 36.0 g/dL   RDW 84.4 88.4 - 84.4 %   Platelets 280 150 - 400 K/uL   nRBC 0.0 0.0 - 0.2 %  Comprehensive metabolic panel     Status: Abnormal   Collection Time: 07/22/24  4:23 PM  Result Value Ref Range   Sodium 143 135 - 145 mmol/L   Potassium 3.9 3.5 - 5.1 mmol/L   Chloride 110 98 - 111 mmol/L   CO2 22 22 - 32 mmol/L   Glucose, Bld 106 (H) 70 - 99 mg/dL   BUN 9 6 - 20 mg/dL   Creatinine, Ser 9.44 0.44 - 1.00 mg/dL   Calcium 8.7 (L) 8.9 - 10.3 mg/dL   Total Protein 5.8 (L) 6.5 - 8.1 g/dL   Albumin 2.8 (L) 3.5 - 5.0 g/dL   AST 41 15 - 41 U/L   ALT 74 (H) 0 - 44 U/L   Alkaline Phosphatase 122 38 - 126 U/L   Total Bilirubin 1.0 0.0 - 1.2 mg/dL   GFR, Estimated >39 >39 mL/min   Anion gap 11 5 - 15  Urinalysis, Routine w reflex microscopic -Urine, Clean Catch      Status: Abnormal   Collection Time: 07/22/24  6:50 PM  Result Value Ref Range   Color, Urine YELLOW YELLOW   APPearance HAZY (A) CLEAR   Specific Gravity, Urine 1.011 1.005 -  1.030   pH 5.0 5.0 - 8.0   Glucose, UA NEGATIVE NEGATIVE mg/dL   Hgb urine dipstick LARGE (A) NEGATIVE   Bilirubin Urine NEGATIVE NEGATIVE   Ketones, ur NEGATIVE NEGATIVE mg/dL   Protein, ur NEGATIVE NEGATIVE mg/dL   Nitrite NEGATIVE NEGATIVE   Leukocytes,Ua TRACE (A) NEGATIVE   RBC / HPF 6-10 0 - 5 RBC/hpf   WBC, UA 0-5 0 - 5 WBC/hpf   Bacteria, UA RARE (A) NONE SEEN   Squamous Epithelial / HPF 0-5 0 - 5 /HPF   Mucus PRESENT     --/--/O POS (09/02 1450)  IMAGING CT Angio Chest PE W and/or Wo Contrast Result Date: 07/22/2024 CLINICAL DATA:  Concern for pulmonary edema. EXAM: CT ANGIOGRAPHY CHEST WITH CONTRAST TECHNIQUE: Multidetector CT imaging of the chest was performed using the standard protocol during bolus administration of intravenous contrast. Multiplanar CT image reconstructions and MIPs were obtained to evaluate the vascular anatomy. RADIATION DOSE REDUCTION: This exam was performed according to the departmental dose-optimization program which includes automated exposure control, adjustment of the mA and/or kV according to patient size and/or use of iterative reconstruction technique. CONTRAST:  67mL OMNIPAQUE  IOHEXOL  350 MG/ML SOLN COMPARISON:  Chest CT dated 02/11/2010. FINDINGS: Cardiovascular: Borderline cardiomegaly. No pericardial effusion. The thoracic aorta is unremarkable. No pulmonary artery embolus identified. Mediastinum/Nodes: No hilar or mediastinal adenopathy. The esophagus is grossly unremarkable. No mediastinal fluid collection. Lungs/Pleura: No focal consolidation, pleural effusion, or pneumothorax. The central airways are patent. Upper Abdomen: No acute abnormality. Musculoskeletal: No acute osseous pathology. Review of the MIP images confirms the above findings. IMPRESSION: No acute  intrathoracic pathology. No CT evidence of pulmonary artery embolus. Electronically Signed   By: Vanetta Chou M.D.   On: 07/22/2024 18:39   MAU Management/MDM: Orders Placed This Encounter  Procedures   CT Angio Chest PE W and/or Wo Contrast   CBC   Comprehensive metabolic panel   Urinalysis, Routine w reflex microscopic -Urine, Clean Catch   ED EKG   Discharge patient Discharge disposition: 01-Home or Self Care; Discharge patient date: 07/22/2024    Meds ordered this encounter  Medications   iohexol  (OMNIPAQUE ) 350 MG/ML injection 67 mL   furosemide  (LASIX ) injection 20 mg   cyclobenzaprine  (FLEXERIL ) 5 MG tablet    Sig: Take 1-2 tablets (5-10 mg total) by mouth 3 (three) times daily as needed for muscle spasms.    Dispense:  30 tablet    Refill:  0    Supervising Provider:   PRATT, TANYA S [[id number]]    No evidence of PE/DVT.  CT scan wnl. BLE, symmetrical with calves 38 cm bilaterally.  Pain is reproducible to palpation of upper abdomen bilaterally.  EKG wnl.  ALT slightly elevated at 74.  Consult Dr Eldonna.  Lasix  IV given x 1 dose in MAU. Rx for Flexeril .  Pt husband is home and she reports he can help with the baby if medicine causes drowsiness. Message sent to set up 1 week appt with [facility].  Return precautions reviewed.    ASSESSMENT 1. Musculoskeletal pain   2. Elevated liver enzymes   3. Postpartum edema     PLAN Discharge home Allergies as of 07/22/2024       Reactions   Penicillins    Keflex  [cephalexin ] Itching, Rash        Medication List     TAKE these medications    acetaminophen  500 MG tablet Commonly known as: TYLENOL  Take 500 mg by mouth every 6 (six)  hours as needed for mild pain (pain score 1-3) or moderate pain (pain score 4-6).   aspirin  EC 81 MG tablet Take 1 tablet (81 mg total) by mouth daily. Swallow whole.   cyclobenzaprine  5 MG tablet Commonly known as: FLEXERIL  Take 1-2 tablets (5-10 mg total) by mouth 3 (three) times daily as needed for  muscle spasms.   ferrous sulfate  325 (65 FE) MG EC tablet Take 325 mg by mouth 3 (three) times daily with meals.   ibuprofen  600 MG tablet Commonly known as: ADVIL  Take 1 tablet (600 mg total) by mouth every 6 (six) hours.   prenatal multivitamin Tabs tablet Take 1 tablet by mouth daily at 12 noon.        Follow-up Information     [facility] [place] Follow up in 1 week(s).   Specialty: Family Medicine Contact information: [address] [place]  [address] [phone] Additional information: [address] [place], [place] [address]        [facility] Follow up.   Specialty: Obstetrics and Gynecology Why: As needed for emergencies Contact information: [address] [place]  [address] [phone]                Olam Boards Certified Nurse-Midwife 07/22/2024  7:52 PM

## 2024-07-22 NOTE — MAU Note (Signed)
 MAU Triage Note  Debra Allen is a 38 y.o. Postpartum patient here in MAU reporting: Vaginal delivery 9/3; she was sent from the office; reports swelling in feet and legs that began on Saturday. she reports SOB since yesterday. States when she tries to breathe or move she has pain in her abdomen that travels to her chest. Reports she also has a HA that comes and goes. Denies visual disturbances.   Onset of complaint: Saturday  Pain score:  8/10 abd & chest 9/10 HA Vitals:   07/22/24 1523  BP: 135/74  Pulse: 65  Resp: 20  Temp: 98.5 F (36.9 C)  SpO2: 99%      Lab orders placed from triage: EKG

## 2024-07-28 ENCOUNTER — Telehealth (HOSPITAL_COMMUNITY): Payer: Self-pay | Admitting: *Deleted

## 2024-07-28 NOTE — Telephone Encounter (Signed)
 07/28/2024  Name: Debra Allen MRN: [id number] DOB: April 23, 1986  Reason for Call:  Transition of Care Hospital Discharge Call  Contact Status: Patient Contact Status: Message  Language assistant needed:          Follow-Up Questions:    Van Postnatal Depression Scale:  In the Past 7 Days:    PHQ2-9 Depression Scale:     Discharge Follow-up:    Post-discharge interventions: NA  Mliss Sieve, RN 07/28/2024 12:54

## 2024-07-29 ENCOUNTER — Ambulatory Visit: Payer: Self-pay

## 2024-07-29 ENCOUNTER — Other Ambulatory Visit: Payer: Self-pay

## 2024-09-02 ENCOUNTER — Ambulatory Visit (INDEPENDENT_AMBULATORY_CARE_PROVIDER_SITE_OTHER): Payer: Self-pay | Admitting: Student

## 2024-09-02 ENCOUNTER — Encounter: Payer: Self-pay | Admitting: Student

## 2024-09-02 ENCOUNTER — Ambulatory Visit: Payer: Self-pay | Admitting: Student

## 2024-09-02 DIAGNOSIS — R7401 Elevation of levels of liver transaminase levels: Secondary | ICD-10-CM

## 2024-09-02 DIAGNOSIS — Z3009 Encounter for other general counseling and advice on contraception: Secondary | ICD-10-CM

## 2024-09-02 DIAGNOSIS — D62 Acute posthemorrhagic anemia: Secondary | ICD-10-CM

## 2024-09-02 NOTE — Progress Notes (Signed)
 Encounter created in error

## 2024-09-02 NOTE — Progress Notes (Signed)
  [facility] Postpartum Visit   Debra Allen is a 38 y.o. [id number] presenting for a postpartum visit.  She has the following concerns today: birth control  She delivered via SVD at [redacted]w[redacted]d.  She reports her vaginal bleeding is stopped. She is breast and bottle  feeding her infant. She feels she is bonding well. She is considering IUD for contraception.   Edinburgh Postnatal Depression Scale: 2 (10 or higher is positive)  Reviewed pregnancy and delivery course.  -  Vitals:   09/02/24 0953  BP: 97/70  Pulse: 81  SpO2: 100%   Exam:  Well-appearing, no acute distress Cardio: Regular rate, regular rhythm, no murmurs on exam. Pulm: Clear, no wheezing, no crackles. No increased work of breathing Abdominal: bowel sounds present, soft, non-tender, non-distended Extremities: no peripheral edema  Pelvic exam: deferred for IUD placement    A/P:  Postpartum visit: patient is 6 weeks postpartum following a vaginal delivery. -Discussed patients delivery and complications -Patient had a 1st degree laceration, perineal healing reviewed. Patient expressed understanding -Patient has urinary incontinence? No -Patient is not safe to resume physical and sexual activity -Patient does not want a pregnancy in the next year.  Desired family size is 5 children.  -Reviewed forms of contraception in tiered fashion. Patient desired IUD today.   -Return to sexual activity and contraception discussed as above.  -Discussed birth spacing of 18 months -Breastfeeding: Yes, provided support of decision and resources as indicated  -Mood: appropriate   -Discussed sleep and fatigue management and encouraged family/community support.  -Postpartum vaccines: none -Need for postpartum diabetes screening:  no -Return in No follow-ups on file.   IUD placement scheduled 10/23 with me

## 2024-09-02 NOTE — Patient Instructions (Signed)
 It was great to see you today!   I have an appointment for your IUD this Thursday   I will check blood work today and will go over results with you at that visit   Future Appointments  Date Time Provider Department Center  09/04/2024  2:50 PM Cleotilde Perkins, DO [facility]    Please arrive 15 minutes before your appointment to ensure smooth check in process.  If you are more than 15 minutes late, you may be asked to reschedule.   Please bring a list of your medications with you to all appointments.   Please call the clinic at [phone] if your symptoms worsen or you have any concerns.  Thank you for allowing me to participate in your care, Dr. Perkins Cleotilde [facility]

## 2024-09-02 NOTE — Addendum Note (Signed)
 Addended by: Adria Costley on: 09/02/2024 12:05 PM   Modules accepted: Orders

## 2024-09-03 ENCOUNTER — Ambulatory Visit: Payer: Self-pay | Admitting: Student

## 2024-09-03 LAB — COMPREHENSIVE METABOLIC PANEL WITH GFR
ALT: 82 IU/L — ABNORMAL HIGH (ref 0–32)
AST: 35 IU/L (ref 0–40)
Albumin: 4.7 g/dL (ref 3.9–4.9)
Alkaline Phosphatase: 100 IU/L (ref 41–116)
BUN/Creatinine Ratio: 18 (ref 9–23)
BUN: 9 mg/dL (ref 6–20)
Bilirubin Total: 1.5 mg/dL — ABNORMAL HIGH (ref 0.0–1.2)
CO2: 20 mmol/L (ref 20–29)
Calcium: 9.5 mg/dL (ref 8.7–10.2)
Chloride: 102 mmol/L (ref 96–106)
Creatinine, Ser: 0.51 mg/dL — ABNORMAL LOW (ref 0.57–1.00)
Globulin, Total: 2.6 g/dL (ref 1.5–4.5)
Glucose: 98 mg/dL (ref 70–99)
Potassium: 3.9 mmol/L (ref 3.5–5.2)
Sodium: 139 mmol/L (ref 134–144)
Total Protein: 7.3 g/dL (ref 6.0–8.5)
eGFR: 124 mL/min/1.73 (ref >59)

## 2024-09-03 LAB — CBC
Hematocrit: 38.3 % (ref 34.0–46.6)
Hemoglobin: 12.2 g/dL (ref 11.1–15.9)
MCH: 27.4 pg (ref 26.6–33.0)
MCHC: 31.9 g/dL (ref 31.5–35.7)
MCV: 86 fL (ref 79–97)
Platelets: 194 x10E3/uL (ref 150–450)
RBC: 4.46 x10E6/uL (ref 3.77–5.28)
RDW: 17.8 % — ABNORMAL HIGH (ref 11.7–15.4)
WBC: 6.6 x10E3/uL (ref 3.4–10.8)

## 2024-09-04 ENCOUNTER — Ambulatory Visit (INDEPENDENT_AMBULATORY_CARE_PROVIDER_SITE_OTHER): Payer: Self-pay | Admitting: Student

## 2024-09-04 VITALS — BP 114/82 | HR 80 | Temp 98.1°F | Wt 194.6 lb

## 2024-09-04 DIAGNOSIS — Z30430 Encounter for insertion of intrauterine contraceptive device: Secondary | ICD-10-CM

## 2024-09-04 DIAGNOSIS — R7401 Elevation of levels of liver transaminase levels: Secondary | ICD-10-CM

## 2024-09-04 LAB — POCT URINE PREGNANCY: Preg Test, Ur: NEGATIVE

## 2024-09-04 MED ORDER — KETOROLAC TROMETHAMINE 15 MG/ML IJ SOLN: 15.0000 mg | Freq: Once | INTRAMUSCULAR | Status: DC

## 2024-09-04 NOTE — Progress Notes (Signed)
    SUBJECTIVE:   CHIEF COMPLAINT / HPI:   Debra Allen is a 38 y.o. female presenting for IUD insertion.   Unfortunately patient does not have insurance. Gave Mirena  IUD scholarship program for her to fill out. Once we have coverage can scheduled to place IUD.   Damien Pinal, DO [facility]

## 2024-09-09 ENCOUNTER — Telehealth: Payer: Self-pay | Admitting: Nurse Practitioner

## 2024-09-09 NOTE — Telephone Encounter (Signed)
 Pt dropped off form at front desk for IUD.  Verified that patient section of form has been completed.  Last DOS/WCC with PCP was 09/04/2024.  Placed form in [place] team folder to be completed by clinical staff.  Debra Allen

## 2024-09-10 NOTE — Telephone Encounter (Signed)
 [employer] patient assistance documentation completed and placed in Dr. Dianne box for signature.   Chiquita JAYSON English, RN

## 2024-09-15 NOTE — Telephone Encounter (Signed)
 Faxed to [employer].   Copy made and placed in batch scanning.   Chiquita JAYSON English, RN

## 2024-10-06 NOTE — Telephone Encounter (Signed)
 Updated prescribing information faxed to [employer].   Copy placed at my desk in bottom file.   Chiquita JAYSON English, RN

## 2024-10-23 NOTE — Telephone Encounter (Signed)
 Received call from [employer] patient assistance regarding application. They have conflicting DOB.   Provided with patient's DOB. They are sending updated order to speciality pharmacy in which they should ship the Mirena  device to our office.   Chiquita JAYSON English, RN

## 2024-10-31 NOTE — Telephone Encounter (Signed)
 Received shipment from [employer] patient assistance.   Scheduled procedure visit for 12/31 at 3:30.  Chiquita JAYSON English, RN

## 2024-11-10 ENCOUNTER — Ambulatory Visit: Payer: Self-pay | Admitting: Student

## 2024-11-12 ENCOUNTER — Encounter: Payer: Self-pay | Admitting: Student

## 2024-11-12 ENCOUNTER — Ambulatory Visit: Payer: Self-pay | Admitting: Student

## 2024-11-12 VITALS — BP 104/74 | HR 88 | Ht 63.0 in | Wt 191.0 lb

## 2024-11-12 DIAGNOSIS — Z30019 Encounter for initial prescription of contraceptives, unspecified: Secondary | ICD-10-CM

## 2024-11-12 DIAGNOSIS — R102 Pelvic and perineal pain unspecified side: Secondary | ICD-10-CM

## 2024-11-12 DIAGNOSIS — Z30430 Encounter for insertion of intrauterine contraceptive device: Secondary | ICD-10-CM

## 2024-11-12 LAB — POCT URINE PREGNANCY: Preg Test, Ur: NEGATIVE

## 2024-11-12 MED ORDER — LEVONORGESTREL 20 MCG/DAY IU IUD
1.0000 | INTRAUTERINE_SYSTEM | Freq: Once | INTRAUTERINE | Status: AC
  Administered 2024-11-12: 1 via INTRAUTERINE

## 2024-11-12 MED ORDER — KETOROLAC TROMETHAMINE 30 MG/ML IJ SOLN
30.0000 mg | Freq: Once | INTRAMUSCULAR | Status: AC
  Administered 2024-11-12: 30 mg via INTRAMUSCULAR

## 2024-11-12 NOTE — Patient Instructions (Addendum)
 Today you had an IUD inserted.  The most common side effects after this type of procedure are cramping, spotting, and period-like symptoms for 24 to 48 hours. Please avoid inserting anything into the vagina for 12-24 hours after the procedure to avoid further injury and risk of infection. You should not experience any fevers, chills, abnormal discharge or foul odor. If you have any of these symptoms, please contact your healthcare provider right away.   If you have worsening bleeding and cramping, or you experience sharp pain that does not improve with over-the-counter pain control, please contact us  by phone or through MyChart right away.  Home Pain control: I recommend taking 400-600mg  of ibuprofen every 4 hours for the next 24 hours to manage the pain and cramping that can happen following this procedure. For breakthrough pain, you can use 500 mg of tylenol , with at least 4 hours between doses. Heating pads, warm baths and other home pain control measures are also acceptable.   Return to Fertility: For the Myrena/Kyleena/Skyla IUD, it can take up to 3 months for your hormones to return to their baseline, and fertility to return. This does not mean you cannot get pregnant during this time. If you do not desire pregnancy immediately, you should use other forms of birth control, such as condoms or birth control pills. If you do desire pregnancy, please keep this timeline in mind when considering if you are having difficulty becoming pregnant. While trying to conceive I recommend taking a prenatal vitamin to optimize your health. Your vitamin should contain at least 400mg  of folic acid.  Pregnancy Prevention: For the progesterone containing implants, hormonal regulation and pregnancy prevention will not reach peak effect for about a month following placement. Please use condoms in the interim to prevent pregnancy and anytime you have intercourse to prevent transmission of STIs. If you have the copper  IUD, pregnancy prevention will begin immediately, but will reach peak effectiveness within one week of placement. As with the progesterone IUD, I recommend using condoms for pregnancy prevention during this time.   FAQ: How do I know my IUD is working?- your IUD will start working in the time frame listed above. Some patients may experience lighter, less frequent periods, or stop having them all together with the progesterone containing IUDs.  How do I know my IUD is in the correct place? You should be able to feel the strings of your IUD in your vagina. If you do not, or you have a concern, any health care provider can check to see if they are present. If they are not visible, you may need to have an ultrasound to confirm the appropriate position.  How long will my IUD last? Length of effectiveness depends on what type of IUD you have, and how strong the dose of hormones is if hormones are present. The copper IUD lasts up to 12 years, and the progesterone IUD lasts 3, 5, or 10 years based on what dose of progesterone is present.  What if my IUD fails? If you become pregnant while using your IUD, it is important to contact your healthcare provider right away. While extremely rare, pregnancy with an IUD in place can be dangerous, and should be addressed quickly for your safety and continued good health.

## 2024-11-12 NOTE — Progress Notes (Signed)
" ° ° °  SUBJECTIVE:   CHIEF COMPLAINT / HPI:   Debra Allen is a 38 y.o. female presenting for IUD insertion and pelvic pain.   Patient reports that she has had intermittent severe pelvic pain.  She reports during delivery she was told to start pushing too early before she was ready In the hospital after delivery she was having this pain She was told it would go away in 6 weeks and it has not She feels like the pain is shallow, not deep in her uterus   PERTINENT  PMH / PSH: reviewed and updated.  OBJECTIVE:   BP 104/74   Pulse 88   Ht 5' 3 (1.6 m)   Wt 191 lb (86.6 kg)   LMP 10/28/2024 (Exact Date)   SpO2 100%   Breastfeeding No   BMI 33.83 kg/m   Well-appearing, no acute distress Cardio: Regular rate, regular rhythm, no murmurs on exam. Pulm: Clear, no wheezing, no crackles. No increased work of breathing  Pelvic Exam: MA chaperone present  Normal external genitalia No abnormal discharge  No cervical motion tenderness  Cervix visualized with no lesions    ASSESSMENT/PLAN:   Assessment & Plan Encounter for female birth control Encounter for IUD insertion  Patient given informed consent, signed copy in the chart..  Negative pregnancy confirmed.  Appropriate time out taken.   Sterile instruments and technique was used. Cervix brought into view with use of speculum and then cleansed three times with  betadine swabs.  A tenaculum was placed into the anterior lip of the cervix and a uterine sound was used to measure uterine size.   A Mirena  IUD was placed into the endometrial cavity, deployed and secured. The applicator was removed. The strings were trimmed to 2 centimeters.   There were no complications and the patient tolerated the procedure well.   She was given handouts for post procedure instructions and information about the IUD including a card with the time of recommended removal. She was reminded that the IUD does not protect against sexually transmitted  diseases. Pelvic pain Concerning for pelvic floor injury with difficult delivery  Since patient is self pay will start with Kegal exercises at home She will try this for one month, if no improvement will send her to the UroGyn for evaluation      Damien Pinal, DO [facility]  "

## 2024-11-12 NOTE — Addendum Note (Signed)
 Addended by: Aribella Vavra C on: 11/12/2024 04:53 PM   Modules accepted: Orders

## 2024-12-15 ENCOUNTER — Ambulatory Visit: Payer: Self-pay | Admitting: Student

## 2024-12-19 ENCOUNTER — Ambulatory Visit: Payer: Self-pay | Admitting: Student

## 2024-12-19 ENCOUNTER — Other Ambulatory Visit (HOSPITAL_COMMUNITY): Payer: Self-pay

## 2024-12-19 VITALS — BP 110/80 | HR 72 | Ht 63.0 in | Wt 198.6 lb

## 2024-12-19 DIAGNOSIS — R1032 Left lower quadrant pain: Secondary | ICD-10-CM

## 2024-12-19 DIAGNOSIS — Z30431 Encounter for routine checking of intrauterine contraceptive device: Secondary | ICD-10-CM

## 2024-12-19 MED ORDER — CYCLOBENZAPRINE HCL 5 MG PO TABS
5.0000 mg | ORAL_TABLET | Freq: Three times a day (TID) | ORAL | 0 refills | Status: AC | PRN
  Filled 2024-12-19: qty 30, 5d supply, fill #0

## 2024-12-19 NOTE — Progress Notes (Cosign Needed)
" ° ° °  SUBJECTIVE:   CHIEF COMPLAINT / HPI:   Debra Allen is a 39 y.o. female presenting for IUD string check.   Doing well. [name] is now 66 months old. No pelvic pain reported. Normal vaginal bleeding.   PERTINENT  PMH / PSH: reviewed and updated.  OBJECTIVE:   BP 110/80   Pulse 72   Ht 5' 3 (1.6 m)   Wt 198 lb 9.6 oz (90.1 kg)   SpO2 99%   BMI 35.18 kg/m   Well-appearing, no acute distress Cardio: Regular rate, regular rhythm, no murmurs on exam. Pulm: Clear, no wheezing, no crackles. No increased work of breathing  Pelvic Exam: MA chaperone present  Normal external genitalia No abnormal discharge  No cervical motion tenderness  Cervix visualized with no lesions, IUD strings visualized   ASSESSMENT/PLAN:   Assessment & Plan IUD check up Strings in place Follow up as needed  Left lower quadrant abdominal pain Secondary to birth trauma. Has been participating in PT without relief.  Refilled flexeril . Patient will follow up with PCP for further workup      Damien Pinal, DO [facility]  "

## 2024-12-19 NOTE — Patient Instructions (Signed)
 It was great to see you today!   I have sent a muscle relaxer to your pharmacy. You can follow up with your primary provider as needed.   No future appointments.  Please arrive 15 minutes before your appointment to ensure smooth check in process.  If you are more than 15 minutes late, you may be asked to reschedule.   Please bring a list of your medications with you to all appointments.   Please call the clinic at [phone] if your symptoms worsen or you have any concerns.  Thank you for allowing me to participate in your care, Dr. Damien Pinal [facility]

## 2024-12-19 NOTE — Assessment & Plan Note (Addendum)
 Secondary to birth trauma. Has been participating in PT without relief.  Refilled flexeril . Patient will follow up with PCP for further workup
